# Patient Record
Sex: Male | Born: 1950 | Race: White | Hispanic: No | Marital: Married | Smoking: Former smoker
Health system: Southern US, Community
[De-identification: ages and names within clinical notes are randomized; demographics above are authoritative.]

## PROBLEM LIST (undated history)

## (undated) DIAGNOSIS — E119 Type 2 diabetes mellitus without complications: Secondary | ICD-10-CM

## (undated) DIAGNOSIS — I10 Essential (primary) hypertension: Secondary | ICD-10-CM

## (undated) HISTORY — PX: CATARACT EXTRACTION, BILATERAL: SHX1313

---

## 2006-07-05 ENCOUNTER — Ambulatory Visit: Payer: Self-pay | Admitting: Ophthalmology

## 2012-07-25 ENCOUNTER — Ambulatory Visit
Admission: RE | Admit: 2012-07-25 | Discharge: 2012-07-25 | Disposition: A | Discharge status: Home / Self Care | Payer: BC Managed Care – PPO | Source: Ambulatory Visit | Attending: Orthopedic Surgery | Admitting: Orthopedic Surgery

## 2012-07-25 ENCOUNTER — Other Ambulatory Visit: Payer: Self-pay | Admitting: Orthopedic Surgery

## 2012-07-25 DIAGNOSIS — J984 Other disorders of lung: Secondary | ICD-10-CM

## 2012-09-01 ENCOUNTER — Emergency Department (HOSPITAL_COMMUNITY): Payer: BC Managed Care – PPO

## 2012-09-01 ENCOUNTER — Emergency Department (HOSPITAL_COMMUNITY)
Admission: EM | Admit: 2012-09-01 | Discharge: 2012-09-01 | Disposition: A | Discharge status: Home / Self Care | Payer: BC Managed Care – PPO | Attending: Emergency Medicine | Admitting: Emergency Medicine

## 2012-09-01 ENCOUNTER — Encounter (HOSPITAL_COMMUNITY): Payer: Self-pay | Admitting: Emergency Medicine

## 2012-09-01 DIAGNOSIS — Z8639 Personal history of other endocrine, nutritional and metabolic disease: Secondary | ICD-10-CM | POA: Insufficient documentation

## 2012-09-01 DIAGNOSIS — I10 Essential (primary) hypertension: Secondary | ICD-10-CM | POA: Insufficient documentation

## 2012-09-01 DIAGNOSIS — R0602 Shortness of breath: Secondary | ICD-10-CM | POA: Insufficient documentation

## 2012-09-01 DIAGNOSIS — Z87891 Personal history of nicotine dependence: Secondary | ICD-10-CM | POA: Insufficient documentation

## 2012-09-01 DIAGNOSIS — R112 Nausea with vomiting, unspecified: Secondary | ICD-10-CM | POA: Insufficient documentation

## 2012-09-01 DIAGNOSIS — E119 Type 2 diabetes mellitus without complications: Secondary | ICD-10-CM | POA: Insufficient documentation

## 2012-09-01 DIAGNOSIS — R61 Generalized hyperhidrosis: Secondary | ICD-10-CM | POA: Insufficient documentation

## 2012-09-01 DIAGNOSIS — R000 Tachycardia, unspecified: Secondary | ICD-10-CM | POA: Insufficient documentation

## 2012-09-01 DIAGNOSIS — R42 Dizziness and giddiness: Secondary | ICD-10-CM | POA: Insufficient documentation

## 2012-09-01 DIAGNOSIS — Z862 Personal history of diseases of the blood and blood-forming organs and certain disorders involving the immune mechanism: Secondary | ICD-10-CM | POA: Insufficient documentation

## 2012-09-01 HISTORY — DX: Type 2 diabetes mellitus without complications: E11.9

## 2012-09-01 HISTORY — DX: Essential (primary) hypertension: I10

## 2012-09-01 LAB — CBC WITH DIFFERENTIAL/PLATELET
Basophils Absolute: 0 10*3/uL (ref 0.0–0.1)
Eosinophils Absolute: 0 10*3/uL (ref 0.0–0.7)
Eosinophils Relative: 0 % (ref 0–5)
HCT: 48.4 % (ref 39.0–52.0)
Lymphocytes Relative: 2 % — ABNORMAL LOW (ref 12–46)
Lymphs Abs: 0.3 10*3/uL — ABNORMAL LOW (ref 0.7–4.0)
MCH: 35.1 pg — ABNORMAL HIGH (ref 26.0–34.0)
MCV: 98.8 fL (ref 78.0–100.0)
Monocytes Absolute: 0.5 10*3/uL (ref 0.1–1.0)
Platelets: 143 10*3/uL — ABNORMAL LOW (ref 150–400)
RDW: 13.1 % (ref 11.5–15.5)
WBC: 10.7 10*3/uL — ABNORMAL HIGH (ref 4.0–10.5)

## 2012-09-01 LAB — POCT I-STAT, CHEM 8
BUN: 27 mg/dL — ABNORMAL HIGH (ref 6–23)
Calcium, Ion: 1.16 mmol/L (ref 1.13–1.30)
Chloride: 106 mEq/L (ref 96–112)
Glucose, Bld: 273 mg/dL — ABNORMAL HIGH (ref 70–99)
TCO2: 25 mmol/L (ref 0–100)

## 2012-09-01 MED ORDER — ONDANSETRON HCL 4 MG/2ML IJ SOLN
4.0000 mg | Freq: Once | INTRAMUSCULAR | Status: AC
  Administered 2012-09-01: 4 mg via INTRAVENOUS
  Filled 2012-09-01: qty 2

## 2012-09-01 MED ORDER — SODIUM CHLORIDE 0.9 % IV BOLUS (SEPSIS)
1000.0000 mL | Freq: Once | INTRAVENOUS | Status: AC
  Administered 2012-09-01: 1000 mL via INTRAVENOUS

## 2012-09-01 MED ORDER — IOHEXOL 350 MG/ML SOLN
100.0000 mL | Freq: Once | INTRAVENOUS | Status: AC | PRN
  Administered 2012-09-01: 100 mL via INTRAVENOUS

## 2012-09-01 MED ORDER — ASPIRIN 81 MG PO CHEW
324.0000 mg | CHEWABLE_TABLET | Freq: Once | ORAL | Status: AC
  Administered 2012-09-01: 324 mg via ORAL
  Filled 2012-09-01: qty 4

## 2012-09-01 NOTE — ED Provider Notes (Signed)
History     CSN: [id number]  Arrival date & time 09/01/12  1453   First MD Initiated Contact with Patient 09/01/12 1507      Chief Complaint  Patient presents with  . Palpitations  . Emesis    (Consider location/radiation/quality/duration/timing/severity/associated sxs/prior treatment) HPI Comments: Patient presents today with a chief complaint of SOB, palpitations, diaphoresis, nausea, and vomiting. He reports that he began having symptoms earlier this morning.  He has had two episodes of vomiting.  No diarrhea.  He denies abdominal pain.  He denies chest pain.  He reports that PMH includes HTN, Hyperlipidemia, and DM.  He quit smoking three years ago.  He smoked for 25-30 years.  He denies any prior cardiac history.  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  He denies any prolonged travel or surgeries in the past 4 weeks.  He denies prior history of DVT or PE.  He denies any lower extremity swelling, pain, or erythema.  No cough or hemoptysis.  No prior history of Cancer.  He reports that his PO intake has been less today due to nausea.  He had three cups of coffee this morning and vomited after the coffee.  He has not had anything to drink since then.  He is feeling slightly lightheaded.  He denies headache.  Denies weakness.  PCP is Roque Cash with [facility].    The history is provided by the patient.    Past Medical History  Diagnosis Date  . Hypertension     History reviewed. No pertinent past surgical history.  History reviewed. No pertinent family history.  History  Substance Use Topics  . Smoking status: Never Smoker   . Smokeless tobacco: Not on file  . Alcohol Use: Yes      Review of Systems  Constitutional: Positive for diaphoresis. Negative for fever and chills.  Respiratory: Positive for shortness of breath. Negative for  cough and wheezing.   Cardiovascular: Positive for palpitations. Negative for chest pain and leg swelling.  Gastrointestinal: Positive for nausea and vomiting. Negative for abdominal pain, diarrhea and constipation.  Neurological: Positive for dizziness and light-headedness. Negative for syncope, weakness and numbness.  Psychiatric/Behavioral: Negative for confusion.  All other systems reviewed and are negative.    Allergies  Review of patient's allergies indicates no known allergies.  Home Medications  No current outpatient prescriptions on file.  BP 166/109  Pulse 109  Temp(Src) 97 F (36.1 C) (Oral)  Resp 14  SpO2 100%  Physical Exam  Nursing note and vitals reviewed. Constitutional: He appears well-developed and well-nourished. No distress.  HENT:  Head: Normocephalic and atraumatic.  Mouth/Throat: Oropharynx is clear and moist.  Neck: Normal range of motion. Neck supple.  Cardiovascular: Regular rhythm, normal heart sounds and intact distal pulses.  Tachycardia present.   Pulmonary/Chest: Effort normal and breath sounds normal. No respiratory distress. He has no wheezes. He has no rales.  Abdominal: Soft. Bowel sounds are normal. He exhibits no distension and no mass. There is no tenderness. There is no rebound and no guarding.  Musculoskeletal: Normal range of motion. He exhibits no edema.       No LE edema or erythema  Neurological: He is alert.  Skin: Skin is warm and dry. He is not diaphoretic.  Psychiatric: He has a normal mood and affect.  ED Course  Procedures (including critical care time)   Labs Reviewed  CBC WITH DIFFERENTIAL  D-DIMER, QUANTITATIVE   Dg Chest 2 View  09/01/2012  *RADIOLOGY REPORT*  Clinical Data: Hypertension, palpitations, vomiting  CHEST - 2 VIEW  Comparison: CT chest of 07/25/2012  Findings: No active infiltrate or effusion is seen.  Mediastinal contours appear normal.  The heart is within upper limits of normal.  No acute skeletal  abnormality is seen.  IMPRESSION: No active lung disease.   Original Report Authenticated By: Ivar Drape, M.D.    Ct Angio Chest Pe W/cm &/or Wo Cm  09/01/2012  *RADIOLOGY REPORT*  Clinical Data: Tachycardia, shortness of breath.  CT ANGIOGRAPHY CHEST  Technique:  Multidetector CT imaging of the chest using the standard protocol during bolus administration of intravenous contrast. Multiplanar reconstructed images including MIPs were obtained and reviewed to evaluate the vascular anatomy.  Contrast: 15mL OMNIPAQUE IOHEXOL 350 MG/ML SOLN  Comparison: The thorax 07/25/2012  Findings: There are no filling defect within the pulmonary arteries to suggest acute pulmonary embolism.  No acute findings of the aorta or great vessels.  No pericardial fluid.  Esophagus is normal.  No mediastinal or hilar lymphadenopathy.  No axillary supraclavicular adenopathy.  Limited view upper abdomen is unremarkable.  No evidence pneumothorax or pulmonary infection.  Review of the bone windows demonstrate no aggressive osseous lesions.  Degenerative osteophytosis of the thoracic spine.   IMPRESSION: No evidence of pulmonary embolism.  Degenerative spurring of the spine.  No change from prior.   Original Report Authenticated By: Suzy Bouchard, M.D.      No diagnosis found.   Date: 09/01/2012  Rate: 133  Rhythm: sinus tachycardia  QRS Axis: normal  Intervals: normal  ST/T Wave abnormalities: nonspecific ST changes  Conduction Disutrbances:none  Narrative Interpretation:   Old EKG Reviewed: none available  6:30 PM Patient signed out to Dr. Vanita Panda.  Patient remains tachycardic and is currently getting his second bolus of fluid.    MDM  Patient presents today with a chief complaint of nausea, vomiting, shortness of breath, palpitations and diaphoresis.  Patient found to be tachycardic with HR of 133.  Patient given IVF and pulse improved to 110-115 bpm.  CTA negative for PE.  Initial troponin negative.  No ischemic  changes on EKG.  Negative CXR.  Patient signed out to Dr. Vanita Panda.          Sherlyn Lees Anderson, PA-C 09/02/12 [phone]

## 2012-09-01 NOTE — ED Notes (Signed)
Pt c/o SOB, palpitation, diaphoresis and nausea starting this am

## 2012-09-02 NOTE — ED Provider Notes (Signed)
  Medical screening examination/treatment/procedure(s) were performed by non-physician practitioner and as supervising physician I was immediately available for consultation/collaboration.  On my exam the patient was in no distress. On my initial exam, and on multiple subsequent exams the patient had essentially no complaints, though he persistently had tachycardia in spite of IV fluids.  Absent any ongoing complaints, though there were abnormal vital signs, I discussed this case with the patient's primary care team.  We decided the patient will be discharged, with next-day phone followup.  The patient was amenable to this plan, preferred discharge.  I saw the ECG (if appropriate), relevant labs and studies - I agree with the interpretation.    Carmin Muskrat, MD 09/02/12 2326

## 2013-02-18 DIAGNOSIS — K219 Gastro-esophageal reflux disease without esophagitis: Secondary | ICD-10-CM | POA: Insufficient documentation

## 2013-02-18 DIAGNOSIS — Z8673 Personal history of transient ischemic attack (TIA), and cerebral infarction without residual deficits: Secondary | ICD-10-CM | POA: Insufficient documentation

## 2014-02-26 ENCOUNTER — Other Ambulatory Visit (HOSPITAL_COMMUNITY): Payer: BC Managed Care – PPO

## 2014-03-13 ENCOUNTER — Ambulatory Visit (HOSPITAL_COMMUNITY)
Admission: RE | Admit: 2014-03-13 | Discharge: 2014-03-13 | Disposition: A | Discharge status: Home / Self Care | Payer: BC Managed Care – PPO | Source: Ambulatory Visit | Attending: Vascular Surgery | Admitting: Vascular Surgery

## 2014-03-13 ENCOUNTER — Other Ambulatory Visit (HOSPITAL_COMMUNITY): Payer: Self-pay | Admitting: Endocrinology

## 2014-03-13 DIAGNOSIS — I6529 Occlusion and stenosis of unspecified carotid artery: Secondary | ICD-10-CM | POA: Diagnosis present

## 2015-10-28 DIAGNOSIS — E784 Other hyperlipidemia: Secondary | ICD-10-CM | POA: Diagnosis not present

## 2015-10-28 DIAGNOSIS — R740 Nonspecific elevation of levels of transaminase and lactic acid dehydrogenase [LDH]: Secondary | ICD-10-CM | POA: Diagnosis not present

## 2015-10-28 DIAGNOSIS — I10 Essential (primary) hypertension: Secondary | ICD-10-CM | POA: Diagnosis not present

## 2015-10-28 DIAGNOSIS — E119 Type 2 diabetes mellitus without complications: Secondary | ICD-10-CM | POA: Diagnosis not present

## 2015-10-28 DIAGNOSIS — I6529 Occlusion and stenosis of unspecified carotid artery: Secondary | ICD-10-CM | POA: Diagnosis not present

## 2015-10-28 DIAGNOSIS — Z6828 Body mass index (BMI) 28.0-28.9, adult: Secondary | ICD-10-CM | POA: Diagnosis not present

## 2015-10-28 DIAGNOSIS — R61 Generalized hyperhidrosis: Secondary | ICD-10-CM | POA: Diagnosis not present

## 2015-10-28 DIAGNOSIS — Z1389 Encounter for screening for other disorder: Secondary | ICD-10-CM | POA: Diagnosis not present

## 2015-10-28 DIAGNOSIS — R269 Unspecified abnormalities of gait and mobility: Secondary | ICD-10-CM | POA: Diagnosis not present

## 2016-01-21 ENCOUNTER — Other Ambulatory Visit (HOSPITAL_COMMUNITY): Payer: Self-pay | Admitting: Endocrinology

## 2016-01-21 ENCOUNTER — Ambulatory Visit (HOSPITAL_COMMUNITY)
Admission: RE | Admit: 2016-01-21 | Discharge: 2016-01-21 | Disposition: A | Discharge status: Home / Self Care | Payer: Medicare Other | Source: Ambulatory Visit | Attending: Vascular Surgery | Admitting: Vascular Surgery

## 2016-01-21 DIAGNOSIS — I6522 Occlusion and stenosis of left carotid artery: Secondary | ICD-10-CM | POA: Insufficient documentation

## 2016-01-21 DIAGNOSIS — I10 Essential (primary) hypertension: Secondary | ICD-10-CM | POA: Insufficient documentation

## 2016-01-21 DIAGNOSIS — E119 Type 2 diabetes mellitus without complications: Secondary | ICD-10-CM | POA: Diagnosis not present

## 2016-01-21 DIAGNOSIS — I6529 Occlusion and stenosis of unspecified carotid artery: Secondary | ICD-10-CM

## 2016-01-21 LAB — VAS US CAROTID
LCCAPDIAS: 12 cm/s
LEFT ECA DIAS: -21 cm/s
Left CCA dist dias: 14 cm/s
Left CCA dist sys: 49 cm/s
Left CCA prox sys: 73 cm/s
RCCAPDIAS: 8 cm/s
RIGHT CCA MID DIAS: 10 cm/s
RIGHT ECA DIAS: -12 cm/s
Right CCA prox sys: 66 cm/s
Right cca dist sys: -68 cm/s

## 2016-03-02 DIAGNOSIS — R2689 Other abnormalities of gait and mobility: Secondary | ICD-10-CM | POA: Diagnosis not present

## 2016-03-02 DIAGNOSIS — E119 Type 2 diabetes mellitus without complications: Secondary | ICD-10-CM | POA: Diagnosis not present

## 2016-03-02 DIAGNOSIS — Z6827 Body mass index (BMI) 27.0-27.9, adult: Secondary | ICD-10-CM | POA: Diagnosis not present

## 2016-03-02 DIAGNOSIS — E784 Other hyperlipidemia: Secondary | ICD-10-CM | POA: Diagnosis not present

## 2016-03-02 DIAGNOSIS — I6529 Occlusion and stenosis of unspecified carotid artery: Secondary | ICD-10-CM | POA: Diagnosis not present

## 2016-03-02 DIAGNOSIS — I10 Essential (primary) hypertension: Secondary | ICD-10-CM | POA: Diagnosis not present

## 2016-06-01 DIAGNOSIS — E119 Type 2 diabetes mellitus without complications: Secondary | ICD-10-CM | POA: Diagnosis not present

## 2016-06-29 DIAGNOSIS — I6529 Occlusion and stenosis of unspecified carotid artery: Secondary | ICD-10-CM | POA: Diagnosis not present

## 2016-06-29 DIAGNOSIS — R2689 Other abnormalities of gait and mobility: Secondary | ICD-10-CM | POA: Diagnosis not present

## 2016-06-29 DIAGNOSIS — E784 Other hyperlipidemia: Secondary | ICD-10-CM | POA: Diagnosis not present

## 2016-06-29 DIAGNOSIS — R740 Nonspecific elevation of levels of transaminase and lactic acid dehydrogenase [LDH]: Secondary | ICD-10-CM | POA: Diagnosis not present

## 2016-06-29 DIAGNOSIS — Z6828 Body mass index (BMI) 28.0-28.9, adult: Secondary | ICD-10-CM | POA: Diagnosis not present

## 2016-06-29 DIAGNOSIS — I10 Essential (primary) hypertension: Secondary | ICD-10-CM | POA: Diagnosis not present

## 2016-06-29 DIAGNOSIS — E119 Type 2 diabetes mellitus without complications: Secondary | ICD-10-CM | POA: Diagnosis not present

## 2016-06-29 DIAGNOSIS — M25562 Pain in left knee: Secondary | ICD-10-CM | POA: Diagnosis not present

## 2016-06-29 DIAGNOSIS — M25561 Pain in right knee: Secondary | ICD-10-CM | POA: Diagnosis not present

## 2016-09-09 ENCOUNTER — Encounter (HOSPITAL_COMMUNITY): Payer: Self-pay

## 2016-09-09 ENCOUNTER — Emergency Department (HOSPITAL_COMMUNITY)
Admission: EM | Admit: 2016-09-09 | Discharge: 2016-09-09 | Disposition: A | Discharge status: Home / Self Care | Payer: Medicare Other | Attending: Emergency Medicine | Admitting: Emergency Medicine

## 2016-09-09 DIAGNOSIS — I10 Essential (primary) hypertension: Secondary | ICD-10-CM | POA: Insufficient documentation

## 2016-09-09 DIAGNOSIS — H6591 Unspecified nonsuppurative otitis media, right ear: Secondary | ICD-10-CM

## 2016-09-09 DIAGNOSIS — H73891 Other specified disorders of tympanic membrane, right ear: Secondary | ICD-10-CM | POA: Diagnosis not present

## 2016-09-09 DIAGNOSIS — Z7984 Long term (current) use of oral hypoglycemic drugs: Secondary | ICD-10-CM | POA: Insufficient documentation

## 2016-09-09 DIAGNOSIS — E119 Type 2 diabetes mellitus without complications: Secondary | ICD-10-CM | POA: Diagnosis not present

## 2016-09-09 DIAGNOSIS — H6121 Impacted cerumen, right ear: Secondary | ICD-10-CM | POA: Diagnosis not present

## 2016-09-09 DIAGNOSIS — Z7982 Long term (current) use of aspirin: Secondary | ICD-10-CM | POA: Diagnosis not present

## 2016-09-09 DIAGNOSIS — H9191 Unspecified hearing loss, right ear: Secondary | ICD-10-CM | POA: Diagnosis present

## 2016-09-09 MED ORDER — OXYMETAZOLINE HCL 0.05 % NA SOLN
1.0000 | Freq: Two times a day (BID) | NASAL | 0 refills | Status: DC
Start: 2016-09-09 — End: 2019-07-02

## 2016-09-09 NOTE — Discharge Instructions (Signed)
I recommend using the decongestant nasal spray as prescribed for the next 2-3 days to help with the fluid behind your right eardrum.  If your symptoms have not improved over the next 2-3 days I recommend being seen by your primary care provider or returning to the emergency department for reevaluation. Please return to the Emergency Department if symptoms worsen or new onset of fever, swelling or redness around your ear, ear drainage, loss of hearing, dizziness.

## 2016-09-09 NOTE — ED Provider Notes (Signed)
[facility] DEPT Provider Note   CSN: [id number] Arrival date & time: 09/09/16  0840     History   Chief Complaint Chief Complaint  Patient presents with  . ear stopped up    HPI Austin Goodwin is a 66 y.o. male.  HPI   Patient is a 66 year old male with history of hypertension and diabetes who presents to the ED with complaint of difficulty hearing from his right ear, onset this morning. Patient reports when he woke up today he felt like his right ear was "stopped up". He reports having similar symptoms in the past, last time occurring a year ago when he had a cerumen impaction. Patient endorses using Q-tips at home. Denies fever, facial/ear swelling or redness, ear drainage, nasal congestion, rhinorrhea, sore throat. Denies taking any medications at home for symptoms.  Past Medical History:  Diagnosis Date  . Diabetes mellitus without complication ([place])   . Hypertension     There are no active problems to display for this patient.   History reviewed. No pertinent surgical history.     Home Medications    Prior to Admission medications   Medication Sig Start Date End Date Taking? Authorizing Provider  aspirin 81 MG chewable tablet Chew 81 mg by mouth at bedtime.    Historical Provider, MD  glipiZIDE (GLUCOTROL) 10 MG tablet Take 10 mg by mouth 2 (two) times daily before a meal.    Historical Provider, MD  lisinopril (PRINIVIL,ZESTRIL) 10 MG tablet Take 10 mg by mouth 3 (three) times daily.    Historical Provider, MD  metFORMIN (GLUCOPHAGE) 500 MG tablet Take 500 mg by mouth 2 (two) times daily with a meal.    Historical Provider, MD  oxymetazoline (AFRIN NASAL SPRAY) 0.05 % nasal spray Place 1 spray into both nostrils 2 (two) times daily. Spray once into each nostril twice daily for up to the next 3 days. Do not use for more than 3 days to prevent rebound rhinorrhea. 09/09/16   Nona Dell, PA-C  rosuvastatin (CRESTOR) 10 MG tablet Take 10 mg by mouth  once a week. Wednesday    Historical Provider, MD    Family History No family history on file.  Social History Social History  Substance Use Topics  . Smoking status: Never Smoker  . Smokeless tobacco: Not on file  . Alcohol use Yes     Allergies   Patient has no known allergies.   Review of Systems Review of Systems  Constitutional: Negative for fever.  HENT: Positive for ear pain. Negative for congestion, ear discharge, facial swelling, sinus pain and sore throat.   Neurological: Negative for headaches.     Physical Exam Updated Vital Signs BP 178/93 (BP Location: Left Arm)   Pulse 80   Temp 98.3 F (36.8 C) (Oral)   Resp 18   SpO2 98%   Physical Exam  Constitutional: He is oriented to person, place, and time. He appears well-developed and well-nourished.  HENT:  Head: Normocephalic and atraumatic.  Right Ear: Tympanic membrane, external ear and ear canal normal. No drainage, swelling or tenderness. No mastoid tenderness. Tympanic membrane is not erythematous. No middle ear effusion. Decreased hearing (mild) is noted.  Left Ear: Hearing, tympanic membrane, external ear and ear canal normal. No drainage, swelling or tenderness. No mastoid tenderness.  No middle ear effusion. No decreased hearing is noted.  Mouth/Throat: Uvula is midline, oropharynx is clear and moist and mucous membranes are normal. No oropharyngeal exudate, posterior oropharyngeal edema, posterior  oropharyngeal erythema or tonsillar abscesses. No tonsillar exudate.  Cerumen impaction present to right ear canal, only able to visualize half of right TM.  Eyes: Conjunctivae and EOM are normal. Pupils are equal, round, and reactive to light. Right eye exhibits no discharge. Left eye exhibits no discharge. No scleral icterus.  Neck: Normal range of motion. Neck supple.  Cardiovascular: Normal rate, regular rhythm, normal heart sounds and intact distal pulses.   Pulmonary/Chest: Effort normal and breath  sounds normal. No respiratory distress. He has no wheezes. He has no rales. He exhibits no tenderness.  Abdominal: He exhibits no distension.  Lymphadenopathy:    He has no cervical adenopathy.  Neurological: He is alert and oriented to person, place, and time.  Nursing note and vitals reviewed.    ED Treatments / Results  Labs (all labs ordered are listed, but only abnormal results are displayed) Labs Reviewed - No data to display  EKG  EKG Interpretation None       Radiology No results found.  Procedures .Ear Cerumen Removal Date/Time: 09/09/2016 10:15 AM Performed by: Nona Dell Authorized by: Nona Dell   Consent:    Consent obtained:  Verbal   Consent given by:  Patient Procedure details:    Location:  R ear   Procedure type: irrigation   Post-procedure details:    Inspection:  TM intact (Right mid ear effusion present)   Post-procedure hearing quality: Mildy decreased.   Patient tolerance of procedure:  Tolerated well, no immediate complications   (including critical care time)  Medications Ordered in ED Medications - No data to display   Initial Impression / Assessment and Plan / ED Course  I have reviewed the triage vital signs and the nursing notes.  Pertinent labs & imaging results that were available during my care of the patient were reviewed by me and considered in my medical decision making (see chart for details).     Patient presents with muffled hearing to the right ear that started this morning. Reports similar symptoms in the past with cerumen impaction. Denies fever, ear drainage or loss of hearing. VSS. Exam revealed partial right cerumen impaction, only able to visualize half of right TM which appears intact. Hearing grossly intact. Remaining exam unremarkable. Right ear irrigated by nurse. On reevaluation cerumen impaction removed with irrigation and ear curet. TM intact with middle ear effusion present. Suspect  patient's symptoms are likely due to middle ear effusion. Plan to discharge patient home with decongestion and PCP follow-up. Discussed return precautions.  Final Clinical Impressions(s) / ED Diagnoses   Final diagnoses:  Fluid level behind tympanic membrane of right ear  Impacted cerumen of right ear    New Prescriptions New Prescriptions   OXYMETAZOLINE (AFRIN NASAL SPRAY) 0.05 % NASAL SPRAY    Place 1 spray into both nostrils 2 (two) times daily. Spray once into each nostril twice daily for up to the next 3 days. Do not use for more than 3 days to prevent rebound rhinorrhea.     Austin Goodwin [place], [place] 09/09/16 [place], MD 09/09/16 [phone]

## 2016-09-09 NOTE — ED Triage Notes (Signed)
Patient complains of right ear stopped up for a few days, no pain, no cold symptoms.

## 2016-09-09 NOTE — ED Notes (Signed)
Irrigated right ear with warm water and peroixide.

## 2016-09-09 NOTE — ED Notes (Signed)
C/o difficulty hearing out of right ear, states he has had problems with wax build up before.

## 2016-09-13 DIAGNOSIS — H6691 Otitis media, unspecified, right ear: Secondary | ICD-10-CM | POA: Diagnosis not present

## 2016-09-13 DIAGNOSIS — H6981 Other specified disorders of Eustachian tube, right ear: Secondary | ICD-10-CM | POA: Diagnosis not present

## 2016-09-13 DIAGNOSIS — H60501 Unspecified acute noninfective otitis externa, right ear: Secondary | ICD-10-CM | POA: Diagnosis not present

## 2016-09-13 DIAGNOSIS — H9191 Unspecified hearing loss, right ear: Secondary | ICD-10-CM | POA: Diagnosis not present

## 2016-09-21 DIAGNOSIS — H6981 Other specified disorders of Eustachian tube, right ear: Secondary | ICD-10-CM | POA: Diagnosis not present

## 2016-09-21 DIAGNOSIS — H9191 Unspecified hearing loss, right ear: Secondary | ICD-10-CM | POA: Diagnosis not present

## 2016-09-21 DIAGNOSIS — H60501 Unspecified acute noninfective otitis externa, right ear: Secondary | ICD-10-CM | POA: Diagnosis not present

## 2016-10-04 DIAGNOSIS — H903 Sensorineural hearing loss, bilateral: Secondary | ICD-10-CM | POA: Diagnosis not present

## 2016-10-04 DIAGNOSIS — E119 Type 2 diabetes mellitus without complications: Secondary | ICD-10-CM | POA: Insufficient documentation

## 2016-10-04 DIAGNOSIS — H9121 Sudden idiopathic hearing loss, right ear: Secondary | ICD-10-CM | POA: Diagnosis not present

## 2016-10-18 DIAGNOSIS — H90A22 Sensorineural hearing loss, unilateral, left ear, with restricted hearing on the contralateral side: Secondary | ICD-10-CM | POA: Diagnosis not present

## 2016-10-18 DIAGNOSIS — H9121 Sudden idiopathic hearing loss, right ear: Secondary | ICD-10-CM | POA: Diagnosis not present

## 2016-11-04 DIAGNOSIS — H903 Sensorineural hearing loss, bilateral: Secondary | ICD-10-CM | POA: Diagnosis not present

## 2016-11-04 DIAGNOSIS — H9121 Sudden idiopathic hearing loss, right ear: Secondary | ICD-10-CM | POA: Diagnosis not present

## 2016-11-17 DIAGNOSIS — I6529 Occlusion and stenosis of unspecified carotid artery: Secondary | ICD-10-CM | POA: Diagnosis not present

## 2016-11-17 DIAGNOSIS — E1159 Type 2 diabetes mellitus with other circulatory complications: Secondary | ICD-10-CM | POA: Diagnosis not present

## 2016-11-17 DIAGNOSIS — I10 Essential (primary) hypertension: Secondary | ICD-10-CM | POA: Diagnosis not present

## 2016-11-17 DIAGNOSIS — E784 Other hyperlipidemia: Secondary | ICD-10-CM | POA: Diagnosis not present

## 2016-11-17 DIAGNOSIS — E291 Testicular hypofunction: Secondary | ICD-10-CM | POA: Diagnosis not present

## 2016-11-18 DIAGNOSIS — H9121 Sudden idiopathic hearing loss, right ear: Secondary | ICD-10-CM | POA: Diagnosis not present

## 2016-11-18 DIAGNOSIS — H918X3 Other specified hearing loss, bilateral: Secondary | ICD-10-CM | POA: Diagnosis not present

## 2017-02-17 DIAGNOSIS — E784 Other hyperlipidemia: Secondary | ICD-10-CM | POA: Diagnosis not present

## 2017-02-17 DIAGNOSIS — E1151 Type 2 diabetes mellitus with diabetic peripheral angiopathy without gangrene: Secondary | ICD-10-CM | POA: Diagnosis not present

## 2017-02-17 DIAGNOSIS — H9120 Sudden idiopathic hearing loss, unspecified ear: Secondary | ICD-10-CM | POA: Diagnosis not present

## 2017-02-17 DIAGNOSIS — I6529 Occlusion and stenosis of unspecified carotid artery: Secondary | ICD-10-CM | POA: Diagnosis not present

## 2017-04-26 DIAGNOSIS — H43811 Vitreous degeneration, right eye: Secondary | ICD-10-CM | POA: Diagnosis not present

## 2017-06-01 DIAGNOSIS — M1611 Unilateral primary osteoarthritis, right hip: Secondary | ICD-10-CM | POA: Diagnosis not present

## 2017-06-01 DIAGNOSIS — Z6829 Body mass index (BMI) 29.0-29.9, adult: Secondary | ICD-10-CM | POA: Diagnosis not present

## 2017-06-01 DIAGNOSIS — M545 Low back pain: Secondary | ICD-10-CM | POA: Diagnosis not present

## 2017-06-01 DIAGNOSIS — M25551 Pain in right hip: Secondary | ICD-10-CM | POA: Diagnosis not present

## 2017-06-03 DIAGNOSIS — M5416 Radiculopathy, lumbar region: Secondary | ICD-10-CM | POA: Diagnosis not present

## 2017-06-03 DIAGNOSIS — E7849 Other hyperlipidemia: Secondary | ICD-10-CM | POA: Diagnosis not present

## 2017-06-03 DIAGNOSIS — E1151 Type 2 diabetes mellitus with diabetic peripheral angiopathy without gangrene: Secondary | ICD-10-CM | POA: Diagnosis not present

## 2017-06-03 DIAGNOSIS — I10 Essential (primary) hypertension: Secondary | ICD-10-CM | POA: Diagnosis not present

## 2017-06-07 DIAGNOSIS — M5116 Intervertebral disc disorders with radiculopathy, lumbar region: Secondary | ICD-10-CM | POA: Diagnosis not present

## 2017-06-07 DIAGNOSIS — M4316 Spondylolisthesis, lumbar region: Secondary | ICD-10-CM | POA: Diagnosis not present

## 2017-06-07 DIAGNOSIS — M4726 Other spondylosis with radiculopathy, lumbar region: Secondary | ICD-10-CM | POA: Diagnosis not present

## 2017-06-07 DIAGNOSIS — M5117 Intervertebral disc disorders with radiculopathy, lumbosacral region: Secondary | ICD-10-CM | POA: Diagnosis not present

## 2017-06-09 DIAGNOSIS — M4316 Spondylolisthesis, lumbar region: Secondary | ICD-10-CM | POA: Diagnosis not present

## 2017-06-09 DIAGNOSIS — M5126 Other intervertebral disc displacement, lumbar region: Secondary | ICD-10-CM | POA: Diagnosis not present

## 2017-06-20 ENCOUNTER — Other Ambulatory Visit: Payer: Self-pay | Admitting: Neurosurgery

## 2017-06-20 DIAGNOSIS — M4316 Spondylolisthesis, lumbar region: Secondary | ICD-10-CM

## 2017-06-20 DIAGNOSIS — I10 Essential (primary) hypertension: Secondary | ICD-10-CM | POA: Diagnosis not present

## 2017-06-20 DIAGNOSIS — M5416 Radiculopathy, lumbar region: Secondary | ICD-10-CM | POA: Diagnosis not present

## 2017-06-20 DIAGNOSIS — E1151 Type 2 diabetes mellitus with diabetic peripheral angiopathy without gangrene: Secondary | ICD-10-CM | POA: Diagnosis not present

## 2017-06-20 DIAGNOSIS — E7849 Other hyperlipidemia: Secondary | ICD-10-CM | POA: Diagnosis not present

## 2017-06-21 ENCOUNTER — Ambulatory Visit
Admission: RE | Admit: 2017-06-21 | Discharge: 2017-06-21 | Disposition: A | Discharge status: Home / Self Care | Payer: Medicare Other | Source: Ambulatory Visit | Attending: Neurosurgery | Admitting: Neurosurgery

## 2017-06-21 DIAGNOSIS — M48061 Spinal stenosis, lumbar region without neurogenic claudication: Secondary | ICD-10-CM | POA: Diagnosis not present

## 2017-06-21 DIAGNOSIS — M4316 Spondylolisthesis, lumbar region: Secondary | ICD-10-CM

## 2017-06-28 DIAGNOSIS — M5126 Other intervertebral disc displacement, lumbar region: Secondary | ICD-10-CM | POA: Diagnosis not present

## 2017-07-20 DIAGNOSIS — M26602 Left temporomandibular joint disorder, unspecified: Secondary | ICD-10-CM | POA: Diagnosis not present

## 2017-07-20 DIAGNOSIS — R6884 Jaw pain: Secondary | ICD-10-CM | POA: Diagnosis not present

## 2017-09-15 DIAGNOSIS — M4316 Spondylolisthesis, lumbar region: Secondary | ICD-10-CM | POA: Diagnosis not present

## 2017-09-27 DIAGNOSIS — H43821 Vitreomacular adhesion, right eye: Secondary | ICD-10-CM | POA: Diagnosis not present

## 2017-09-27 DIAGNOSIS — E113293 Type 2 diabetes mellitus with mild nonproliferative diabetic retinopathy without macular edema, bilateral: Secondary | ICD-10-CM | POA: Diagnosis not present

## 2017-09-27 DIAGNOSIS — H43813 Vitreous degeneration, bilateral: Secondary | ICD-10-CM | POA: Diagnosis not present

## 2017-10-19 DIAGNOSIS — E291 Testicular hypofunction: Secondary | ICD-10-CM | POA: Diagnosis not present

## 2017-10-19 DIAGNOSIS — E1151 Type 2 diabetes mellitus with diabetic peripheral angiopathy without gangrene: Secondary | ICD-10-CM | POA: Diagnosis not present

## 2017-10-19 DIAGNOSIS — I10 Essential (primary) hypertension: Secondary | ICD-10-CM | POA: Diagnosis not present

## 2017-10-19 DIAGNOSIS — E7849 Other hyperlipidemia: Secondary | ICD-10-CM | POA: Diagnosis not present

## 2017-10-19 DIAGNOSIS — M5416 Radiculopathy, lumbar region: Secondary | ICD-10-CM | POA: Diagnosis not present

## 2017-10-24 ENCOUNTER — Other Ambulatory Visit: Payer: Self-pay | Admitting: Endocrinology

## 2017-10-24 DIAGNOSIS — I6529 Occlusion and stenosis of unspecified carotid artery: Secondary | ICD-10-CM

## 2017-11-30 ENCOUNTER — Other Ambulatory Visit: Payer: Medicare Other

## 2017-11-30 DIAGNOSIS — M1711 Unilateral primary osteoarthritis, right knee: Secondary | ICD-10-CM | POA: Diagnosis not present

## 2017-11-30 DIAGNOSIS — M25562 Pain in left knee: Secondary | ICD-10-CM | POA: Diagnosis not present

## 2017-11-30 DIAGNOSIS — M1712 Unilateral primary osteoarthritis, left knee: Secondary | ICD-10-CM | POA: Diagnosis not present

## 2017-11-30 DIAGNOSIS — M25561 Pain in right knee: Secondary | ICD-10-CM | POA: Diagnosis not present

## 2018-01-16 ENCOUNTER — Ambulatory Visit
Admission: RE | Admit: 2018-01-16 | Discharge: 2018-01-16 | Disposition: A | Discharge status: Home / Self Care | Payer: Medicare Other | Source: Ambulatory Visit | Attending: Endocrinology | Admitting: Endocrinology

## 2018-01-16 DIAGNOSIS — I6523 Occlusion and stenosis of bilateral carotid arteries: Secondary | ICD-10-CM | POA: Diagnosis not present

## 2018-01-16 DIAGNOSIS — I6529 Occlusion and stenosis of unspecified carotid artery: Secondary | ICD-10-CM

## 2018-02-01 DIAGNOSIS — M1711 Unilateral primary osteoarthritis, right knee: Secondary | ICD-10-CM | POA: Diagnosis not present

## 2018-02-01 DIAGNOSIS — M1712 Unilateral primary osteoarthritis, left knee: Secondary | ICD-10-CM | POA: Diagnosis not present

## 2018-02-01 DIAGNOSIS — M25561 Pain in right knee: Secondary | ICD-10-CM | POA: Diagnosis not present

## 2018-02-01 DIAGNOSIS — M25562 Pain in left knee: Secondary | ICD-10-CM | POA: Diagnosis not present

## 2018-04-12 DIAGNOSIS — E7849 Other hyperlipidemia: Secondary | ICD-10-CM | POA: Diagnosis not present

## 2018-04-12 DIAGNOSIS — I6529 Occlusion and stenosis of unspecified carotid artery: Secondary | ICD-10-CM | POA: Diagnosis not present

## 2018-04-12 DIAGNOSIS — E291 Testicular hypofunction: Secondary | ICD-10-CM | POA: Diagnosis not present

## 2018-04-12 DIAGNOSIS — E1151 Type 2 diabetes mellitus with diabetic peripheral angiopathy without gangrene: Secondary | ICD-10-CM | POA: Diagnosis not present

## 2018-04-25 DIAGNOSIS — M4316 Spondylolisthesis, lumbar region: Secondary | ICD-10-CM | POA: Diagnosis not present

## 2018-05-07 DIAGNOSIS — E785 Hyperlipidemia, unspecified: Secondary | ICD-10-CM | POA: Diagnosis not present

## 2018-05-07 DIAGNOSIS — Z6828 Body mass index (BMI) 28.0-28.9, adult: Secondary | ICD-10-CM | POA: Diagnosis not present

## 2018-05-07 DIAGNOSIS — S43121A Dislocation of right acromioclavicular joint, 100%-200% displacement, initial encounter: Secondary | ICD-10-CM | POA: Diagnosis not present

## 2018-05-07 DIAGNOSIS — M19012 Primary osteoarthritis, left shoulder: Secondary | ICD-10-CM | POA: Diagnosis not present

## 2018-05-07 DIAGNOSIS — S3993XA Unspecified injury of pelvis, initial encounter: Secondary | ICD-10-CM | POA: Diagnosis not present

## 2018-05-07 DIAGNOSIS — Z87891 Personal history of nicotine dependence: Secondary | ICD-10-CM | POA: Diagnosis not present

## 2018-05-07 DIAGNOSIS — S0101XA Laceration without foreign body of scalp, initial encounter: Secondary | ICD-10-CM | POA: Diagnosis not present

## 2018-05-07 DIAGNOSIS — M19011 Primary osteoarthritis, right shoulder: Secondary | ICD-10-CM | POA: Diagnosis not present

## 2018-05-07 DIAGNOSIS — I10 Essential (primary) hypertension: Secondary | ICD-10-CM | POA: Diagnosis not present

## 2018-05-07 DIAGNOSIS — S43101A Unspecified dislocation of right acromioclavicular joint, initial encounter: Secondary | ICD-10-CM | POA: Diagnosis not present

## 2018-05-07 DIAGNOSIS — Z79899 Other long term (current) drug therapy: Secondary | ICD-10-CM | POA: Diagnosis not present

## 2018-05-07 DIAGNOSIS — E119 Type 2 diabetes mellitus without complications: Secondary | ICD-10-CM | POA: Diagnosis not present

## 2018-05-07 DIAGNOSIS — M25511 Pain in right shoulder: Secondary | ICD-10-CM | POA: Diagnosis not present

## 2018-05-08 DIAGNOSIS — M1711 Unilateral primary osteoarthritis, right knee: Secondary | ICD-10-CM | POA: Diagnosis not present

## 2018-05-08 DIAGNOSIS — M25562 Pain in left knee: Secondary | ICD-10-CM | POA: Diagnosis not present

## 2018-05-08 DIAGNOSIS — M25561 Pain in right knee: Secondary | ICD-10-CM | POA: Diagnosis not present

## 2018-05-08 DIAGNOSIS — S43101A Unspecified dislocation of right acromioclavicular joint, initial encounter: Secondary | ICD-10-CM | POA: Diagnosis not present

## 2018-06-12 ENCOUNTER — Ambulatory Visit (HOSPITAL_COMMUNITY)
Admission: RE | Admit: 2018-06-12 | Discharge: 2018-06-12 | Disposition: A | Discharge status: Home / Self Care | Payer: Medicare Other | Source: Ambulatory Visit | Attending: Family | Admitting: Family

## 2018-06-12 ENCOUNTER — Other Ambulatory Visit (HOSPITAL_COMMUNITY): Payer: Self-pay | Admitting: Endocrinology

## 2018-06-12 DIAGNOSIS — R600 Localized edema: Secondary | ICD-10-CM | POA: Insufficient documentation

## 2018-06-12 DIAGNOSIS — E7849 Other hyperlipidemia: Secondary | ICD-10-CM | POA: Diagnosis not present

## 2018-06-12 DIAGNOSIS — I8310 Varicose veins of unspecified lower extremity with inflammation: Secondary | ICD-10-CM | POA: Diagnosis not present

## 2018-06-12 DIAGNOSIS — M79604 Pain in right leg: Secondary | ICD-10-CM | POA: Diagnosis not present

## 2018-08-09 DIAGNOSIS — M25562 Pain in left knee: Secondary | ICD-10-CM | POA: Diagnosis not present

## 2018-08-09 DIAGNOSIS — M1711 Unilateral primary osteoarthritis, right knee: Secondary | ICD-10-CM | POA: Diagnosis not present

## 2018-08-09 DIAGNOSIS — M1712 Unilateral primary osteoarthritis, left knee: Secondary | ICD-10-CM | POA: Diagnosis not present

## 2018-08-09 DIAGNOSIS — M25561 Pain in right knee: Secondary | ICD-10-CM | POA: Diagnosis not present

## 2018-08-15 DIAGNOSIS — Z0120 Encounter for dental examination and cleaning without abnormal findings: Secondary | ICD-10-CM | POA: Diagnosis not present

## 2018-09-12 DIAGNOSIS — I10 Essential (primary) hypertension: Secondary | ICD-10-CM | POA: Diagnosis not present

## 2018-09-12 DIAGNOSIS — E7849 Other hyperlipidemia: Secondary | ICD-10-CM | POA: Diagnosis not present

## 2018-09-12 DIAGNOSIS — R82998 Other abnormal findings in urine: Secondary | ICD-10-CM | POA: Diagnosis not present

## 2018-09-12 DIAGNOSIS — Z125 Encounter for screening for malignant neoplasm of prostate: Secondary | ICD-10-CM | POA: Diagnosis not present

## 2018-09-12 DIAGNOSIS — E1151 Type 2 diabetes mellitus with diabetic peripheral angiopathy without gangrene: Secondary | ICD-10-CM | POA: Diagnosis not present

## 2018-09-19 DIAGNOSIS — I10 Essential (primary) hypertension: Secondary | ICD-10-CM | POA: Diagnosis not present

## 2018-09-19 DIAGNOSIS — Z0000 Encounter for general adult medical examination without abnormal findings: Secondary | ICD-10-CM | POA: Diagnosis not present

## 2018-09-19 DIAGNOSIS — E7849 Other hyperlipidemia: Secondary | ICD-10-CM | POA: Diagnosis not present

## 2018-09-19 DIAGNOSIS — E1151 Type 2 diabetes mellitus with diabetic peripheral angiopathy without gangrene: Secondary | ICD-10-CM | POA: Diagnosis not present

## 2018-10-04 DIAGNOSIS — H43821 Vitreomacular adhesion, right eye: Secondary | ICD-10-CM | POA: Diagnosis not present

## 2018-10-04 DIAGNOSIS — H43813 Vitreous degeneration, bilateral: Secondary | ICD-10-CM | POA: Diagnosis not present

## 2018-10-04 DIAGNOSIS — E113293 Type 2 diabetes mellitus with mild nonproliferative diabetic retinopathy without macular edema, bilateral: Secondary | ICD-10-CM | POA: Diagnosis not present

## 2018-11-15 DIAGNOSIS — M170 Bilateral primary osteoarthritis of knee: Secondary | ICD-10-CM | POA: Diagnosis not present

## 2018-11-22 DIAGNOSIS — M1712 Unilateral primary osteoarthritis, left knee: Secondary | ICD-10-CM | POA: Diagnosis not present

## 2018-11-22 DIAGNOSIS — M170 Bilateral primary osteoarthritis of knee: Secondary | ICD-10-CM | POA: Diagnosis not present

## 2018-11-22 DIAGNOSIS — M1711 Unilateral primary osteoarthritis, right knee: Secondary | ICD-10-CM | POA: Diagnosis not present

## 2018-11-22 DIAGNOSIS — M25562 Pain in left knee: Secondary | ICD-10-CM | POA: Diagnosis not present

## 2018-11-29 DIAGNOSIS — M170 Bilateral primary osteoarthritis of knee: Secondary | ICD-10-CM | POA: Diagnosis not present

## 2018-11-29 DIAGNOSIS — M1711 Unilateral primary osteoarthritis, right knee: Secondary | ICD-10-CM | POA: Diagnosis not present

## 2018-11-29 DIAGNOSIS — M1712 Unilateral primary osteoarthritis, left knee: Secondary | ICD-10-CM | POA: Diagnosis not present

## 2018-11-29 DIAGNOSIS — M25561 Pain in right knee: Secondary | ICD-10-CM | POA: Diagnosis not present

## 2018-12-15 DIAGNOSIS — M545 Low back pain: Secondary | ICD-10-CM | POA: Diagnosis not present

## 2018-12-15 DIAGNOSIS — I10 Essential (primary) hypertension: Secondary | ICD-10-CM | POA: Diagnosis not present

## 2019-01-10 DIAGNOSIS — M25561 Pain in right knee: Secondary | ICD-10-CM | POA: Diagnosis not present

## 2019-01-10 DIAGNOSIS — I6529 Occlusion and stenosis of unspecified carotid artery: Secondary | ICD-10-CM | POA: Diagnosis not present

## 2019-01-10 DIAGNOSIS — M170 Bilateral primary osteoarthritis of knee: Secondary | ICD-10-CM | POA: Diagnosis not present

## 2019-01-10 DIAGNOSIS — M25562 Pain in left knee: Secondary | ICD-10-CM | POA: Diagnosis not present

## 2019-01-24 DIAGNOSIS — Z0120 Encounter for dental examination and cleaning without abnormal findings: Secondary | ICD-10-CM | POA: Diagnosis not present

## 2019-03-22 DIAGNOSIS — E291 Testicular hypofunction: Secondary | ICD-10-CM | POA: Diagnosis not present

## 2019-03-22 DIAGNOSIS — I10 Essential (primary) hypertension: Secondary | ICD-10-CM | POA: Diagnosis not present

## 2019-03-22 DIAGNOSIS — E785 Hyperlipidemia, unspecified: Secondary | ICD-10-CM | POA: Diagnosis not present

## 2019-03-22 DIAGNOSIS — E1151 Type 2 diabetes mellitus with diabetic peripheral angiopathy without gangrene: Secondary | ICD-10-CM | POA: Diagnosis not present

## 2019-04-17 ENCOUNTER — Other Ambulatory Visit: Payer: Self-pay

## 2019-04-17 DIAGNOSIS — I6529 Occlusion and stenosis of unspecified carotid artery: Secondary | ICD-10-CM

## 2019-04-18 ENCOUNTER — Ambulatory Visit (HOSPITAL_COMMUNITY)
Admission: RE | Admit: 2019-04-18 | Discharge: 2019-04-18 | Disposition: A | Discharge status: Home / Self Care | Payer: Medicare Other | Source: Ambulatory Visit | Attending: Family | Admitting: Family

## 2019-04-18 ENCOUNTER — Encounter: Payer: Self-pay | Admitting: Vascular Surgery

## 2019-04-18 ENCOUNTER — Other Ambulatory Visit: Payer: Self-pay

## 2019-04-18 ENCOUNTER — Ambulatory Visit: Payer: Medicare Other | Admitting: Vascular Surgery

## 2019-04-18 VITALS — BP 180/97 | HR 66 | Temp 98.0°F | Resp 16 | Ht 72.0 in | Wt 197.0 lb

## 2019-04-18 DIAGNOSIS — I6522 Occlusion and stenosis of left carotid artery: Secondary | ICD-10-CM

## 2019-04-18 DIAGNOSIS — I6529 Occlusion and stenosis of unspecified carotid artery: Secondary | ICD-10-CM | POA: Diagnosis not present

## 2019-04-18 DIAGNOSIS — I10 Essential (primary) hypertension: Secondary | ICD-10-CM | POA: Diagnosis not present

## 2019-04-18 DIAGNOSIS — E119 Type 2 diabetes mellitus without complications: Secondary | ICD-10-CM | POA: Diagnosis not present

## 2019-04-18 DIAGNOSIS — R972 Elevated prostate specific antigen [PSA]: Secondary | ICD-10-CM | POA: Diagnosis not present

## 2019-04-18 DIAGNOSIS — E7849 Other hyperlipidemia: Secondary | ICD-10-CM | POA: Diagnosis not present

## 2019-04-18 NOTE — Progress Notes (Signed)
REASON FOR CONSULT:    Carotid disease.  The consult is requested by Dr. Adriana Mccallum.  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  He is asymptomatic.  If he does elect to undergo knee surgery I do not think he is at any increased risk for perioperative stroke with these findings.  Dr. Forde Dandy follows his carotid disease closely with yearly duplex scan and he will continue his follow-up with Dr. Forde Dandy.  Fortunately he quit smoking in 2011.  I encouraged him to stay as active as possible.  We would only consider addressing a carotid stenosis on the right if it progressed to greater than 80% or he develop new right hemispheric symptoms.  I will see him back as needed.  Deitra Mayo, MD, FACS Beeper [phone] Office: [phone]   HPI:   Austin Goodwin is a pleasant 68 y.o. male, who was referred for evaluation of carotid disease.  I have reviewed the records from the referring office.  The patient is being considered for knee surgery and has a known carotid occlusion and therefore was sent for vascular consultation to be sure that he was not an increased perioperative risk for stroke.  Patient developed to me that it back in 2011 he had several mini strokes associated with some disorientation but no focal weakness.  At that time he was diagnosed with a left carotid occlusion in [facility].  He quit smoking at that time.  Since then he has had no history of stroke, TIAs, expressive or receptive aphasia, or amaurosis fugax.  His risk factors for peripheral vascular disease include diabetes and a remote history of tobacco use.  In addition his father had heart disease at a young age.  He also has hypertension but denies significant hypercholesterolemia.  Past Medical History:  Diagnosis Date  . Diabetes mellitus without complication ([place])   . Hypertension      Family History  Problem Relation Age of Onset  . Mitral valve prolapse Mother   . Heart disease Father     SOCIAL HISTORY: Social History   Socioeconomic History  . Marital status: Married    Spouse name: Not on file  . Number of children: Not on file  . Years of education: Not on file  . Highest education level: Not on file  Occupational History  . Not on file  Social Needs  . Financial resource strain: Not on file  . Food insecurity    Worry: Not on file    Inability: Not on file  . Transportation needs    Medical: Not on file    Non-medical: Not on file  Tobacco Use  . Smoking status: Never Smoker  . Smokeless tobacco: Never Used  Substance and Sexual Activity  . Alcohol use: Yes  . Drug use: No  . Sexual activity: Not on file  Lifestyle  . Physical activity    Days per week: Not on file    Minutes per session: Not on file  . Stress: Not on file  Relationships  . Social Herbalist on phone: Not on file    Gets together: Not on file    Attends religious service: Not on file    Active member of club or organization: Not on file    Attends meetings of clubs or organizations: Not on file    Relationship  status: Not on file  . Intimate partner violence    Fear of current or ex partner: Not on file    Emotionally abused: Not on file    Physically abused: Not on file    Forced sexual activity: Not on file  Other Topics Concern  . Not on file  Social History Narrative  . Not on file    Allergies  Allergen Reactions  . Levofloxacin Hives  . Other     Current Outpatient Medications  Medication Sig Dispense Refill  . aspirin 81 MG chewable tablet Chew 81 mg by mouth at bedtime.    Marland Kitchen glipiZIDE (GLUCOTROL) 10 MG tablet Take 10 mg by mouth 2 (two) times daily before a meal.    . lisinopril (PRINIVIL,ZESTRIL) 10 MG tablet Take 10 mg by mouth 3 (three) times daily.    . metFORMIN (GLUCOPHAGE) 500 MG tablet Take 500 mg by mouth 2 (two) times daily  with a meal.    . oxymetazoline (AFRIN NASAL SPRAY) 0.05 % nasal spray Place 1 spray into both nostrils 2 (two) times daily. Spray once into each nostril twice daily for up to the next 3 days. Do not use for more than 3 days to prevent rebound rhinorrhea. 30 mL 0  . rosuvastatin (CRESTOR) 10 MG tablet Take 10 mg by mouth once a week. Wednesday     No current facility-administered medications for this visit.     REVIEW OF SYSTEMS:  [X]  denotes positive finding, [ ]  denotes negative finding Cardiac  Comments:  Chest pain or chest pressure:    Shortness of breath upon exertion:    Short of breath when lying flat:    Irregular heart rhythm:        Vascular    Pain in calf, thigh, or hip brought on by ambulation:    Pain in feet at night that wakes you up from your sleep:     Blood clot in your veins:    Leg swelling:         Pulmonary    Oxygen at home:    Productive cough:     Wheezing:         Neurologic    Sudden weakness in arms or legs:     Sudden numbness in arms or legs:     Sudden onset of difficulty speaking or slurred speech:    Temporary loss of vision in one eye:     Problems with dizziness:         Gastrointestinal    Blood in stool:     Vomited blood:         Genitourinary    Burning when urinating:     Blood in urine:        Psychiatric    Major depression:         Hematologic    Bleeding problems:    Problems with blood clotting too easily:        Skin    Rashes or ulcers:        Constitutional    Fever or chills:     PHYSICAL EXAM:   Vitals:   04/18/19 1120 04/18/19 1126 04/18/19 1128 04/18/19 1130  BP: (!) 194/86 (!) 175/84 (!) 191/88 (!) 180/97  Pulse: 66 66 66 66  Resp: 16     Temp: 98 F (36.7 C)     TempSrc: Temporal     SpO2: 97%     Weight: 197 lb (89.4 kg)  Height: 6' (1.829 m)       GENERAL: The patient is a well-nourished male, in no acute distress. The vital signs are documented above. CARDIAC: There is a regular rate  and rhythm.  VASCULAR: I do not detect carotid bruits. He has palpable femoral and pedal pulses bilaterally. PULMONARY: There is good air exchange bilaterally without wheezing or rales. ABDOMEN: Soft and non-tender with normal pitched bowel sounds.  MUSCULOSKELETAL: There are no major deformities or cyanosis. NEUROLOGIC: No focal weakness or paresthesias are detected. SKIN: There are no ulcers or rashes noted. PSYCHIATRIC: The patient has a normal affect.  DATA:    CAROTID DUPLEX: I have independently interpreted his carotid duplex scan today.  On the right side, there is no evidence of significant carotid disease.  The right vertebral artery is patent with antegrade flow.  On the left side the internal carotid artery is occluded.  The left vertebral artery is patent with antegrade flow.

## 2019-05-04 DIAGNOSIS — I10 Essential (primary) hypertension: Secondary | ICD-10-CM | POA: Diagnosis not present

## 2019-05-04 DIAGNOSIS — R519 Headache, unspecified: Secondary | ICD-10-CM | POA: Diagnosis not present

## 2019-05-04 DIAGNOSIS — E1151 Type 2 diabetes mellitus with diabetic peripheral angiopathy without gangrene: Secondary | ICD-10-CM | POA: Diagnosis not present

## 2019-05-04 DIAGNOSIS — I6529 Occlusion and stenosis of unspecified carotid artery: Secondary | ICD-10-CM | POA: Diagnosis not present

## 2019-05-31 DIAGNOSIS — H903 Sensorineural hearing loss, bilateral: Secondary | ICD-10-CM | POA: Diagnosis not present

## 2019-06-29 ENCOUNTER — Encounter (HOSPITAL_COMMUNITY): Payer: Self-pay

## 2019-06-29 ENCOUNTER — Other Ambulatory Visit: Payer: Self-pay

## 2019-06-29 ENCOUNTER — Inpatient Hospital Stay (HOSPITAL_COMMUNITY)
Admission: EM | Admit: 2019-06-29 | Discharge: 2019-06-30 | DRG: 683 | Discharge status: Against Medical Advice | Payer: Medicare Other | Attending: Internal Medicine | Admitting: Internal Medicine

## 2019-06-29 DIAGNOSIS — N179 Acute kidney failure, unspecified: Principal | ICD-10-CM | POA: Diagnosis present

## 2019-06-29 DIAGNOSIS — Z79899 Other long term (current) drug therapy: Secondary | ICD-10-CM

## 2019-06-29 DIAGNOSIS — I10 Essential (primary) hypertension: Secondary | ICD-10-CM | POA: Diagnosis not present

## 2019-06-29 DIAGNOSIS — Z888 Allergy status to other drugs, medicaments and biological substances status: Secondary | ICD-10-CM | POA: Diagnosis not present

## 2019-06-29 DIAGNOSIS — E872 Acidosis: Secondary | ICD-10-CM | POA: Diagnosis not present

## 2019-06-29 DIAGNOSIS — R6889 Other general symptoms and signs: Secondary | ICD-10-CM | POA: Diagnosis not present

## 2019-06-29 DIAGNOSIS — E1165 Type 2 diabetes mellitus with hyperglycemia: Secondary | ICD-10-CM | POA: Diagnosis not present

## 2019-06-29 DIAGNOSIS — Z20828 Contact with and (suspected) exposure to other viral communicable diseases: Secondary | ICD-10-CM | POA: Diagnosis present

## 2019-06-29 DIAGNOSIS — R5383 Other fatigue: Secondary | ICD-10-CM | POA: Diagnosis not present

## 2019-06-29 DIAGNOSIS — Z881 Allergy status to other antibiotic agents status: Secondary | ICD-10-CM | POA: Diagnosis not present

## 2019-06-29 DIAGNOSIS — Z7982 Long term (current) use of aspirin: Secondary | ICD-10-CM | POA: Diagnosis not present

## 2019-06-29 DIAGNOSIS — R05 Cough: Secondary | ICD-10-CM | POA: Diagnosis not present

## 2019-06-29 DIAGNOSIS — Z8249 Family history of ischemic heart disease and other diseases of the circulatory system: Secondary | ICD-10-CM | POA: Diagnosis not present

## 2019-06-29 DIAGNOSIS — R739 Hyperglycemia, unspecified: Secondary | ICD-10-CM | POA: Diagnosis not present

## 2019-06-29 DIAGNOSIS — Z79891 Long term (current) use of opiate analgesic: Secondary | ICD-10-CM | POA: Diagnosis not present

## 2019-06-29 DIAGNOSIS — Z7984 Long term (current) use of oral hypoglycemic drugs: Secondary | ICD-10-CM | POA: Diagnosis not present

## 2019-06-29 DIAGNOSIS — N390 Urinary tract infection, site not specified: Secondary | ICD-10-CM | POA: Diagnosis not present

## 2019-06-29 LAB — URINALYSIS, ROUTINE W REFLEX MICROSCOPIC
Bilirubin Urine: NEGATIVE
Glucose, UA: 150 mg/dL — AB
Ketones, ur: NEGATIVE mg/dL
Nitrite: NEGATIVE
Protein, ur: 30 mg/dL — AB
Specific Gravity, Urine: 1.011 (ref 1.005–1.030)
pH: 5 (ref 5.0–8.0)

## 2019-06-29 LAB — COMPREHENSIVE METABOLIC PANEL
ALT: 26 U/L (ref 0–44)
AST: 29 U/L (ref 15–41)
Albumin: 2.9 g/dL — ABNORMAL LOW (ref 3.5–5.0)
Alkaline Phosphatase: 112 U/L (ref 38–126)
Anion gap: 16 — ABNORMAL HIGH (ref 5–15)
BUN: 97 mg/dL — ABNORMAL HIGH (ref 8–23)
CO2: 18 mmol/L — ABNORMAL LOW (ref 22–32)
Calcium: 9.1 mg/dL (ref 8.9–10.3)
Chloride: 102 mmol/L (ref 98–111)
Creatinine, Ser: 4.9 mg/dL — ABNORMAL HIGH (ref 0.61–1.24)
GFR calc Af Amer: 13 mL/min — ABNORMAL LOW (ref >60)
GFR calc non Af Amer: 11 mL/min — ABNORMAL LOW (ref >60)
Glucose, Bld: 334 mg/dL — ABNORMAL HIGH (ref 70–99)
Potassium: 5 mmol/L (ref 3.5–5.1)
Sodium: 136 mmol/L (ref 135–145)
Total Bilirubin: 1.4 mg/dL — ABNORMAL HIGH (ref 0.3–1.2)
Total Protein: 7 g/dL (ref 6.5–8.1)

## 2019-06-29 LAB — CBC
HCT: 40 % (ref 39.0–52.0)
Hemoglobin: 13.7 g/dL (ref 13.0–17.0)
MCH: 35.2 pg — ABNORMAL HIGH (ref 26.0–34.0)
MCHC: 34.3 g/dL (ref 30.0–36.0)
MCV: 102.8 fL — ABNORMAL HIGH (ref 80.0–100.0)
Platelets: 182 10*3/uL (ref 150–400)
RBC: 3.89 MIL/uL — ABNORMAL LOW (ref 4.22–5.81)
RDW: 13.2 % (ref 11.5–15.5)
WBC: 15.3 10*3/uL — ABNORMAL HIGH (ref 4.0–10.5)
nRBC: 0 % (ref 0.0–0.2)

## 2019-06-29 LAB — CBG MONITORING, ED
Glucose-Capillary: 247 mg/dL — ABNORMAL HIGH (ref 70–99)
Glucose-Capillary: 297 mg/dL — ABNORMAL HIGH (ref 70–99)
Glucose-Capillary: 331 mg/dL — ABNORMAL HIGH (ref 70–99)

## 2019-06-29 MED ORDER — SODIUM CHLORIDE 0.9 % IV SOLN
Freq: Once | INTRAVENOUS | Status: AC
  Administered 2019-06-29: 21:00:00 via INTRAVENOUS

## 2019-06-29 NOTE — ED Provider Notes (Signed)
[facility] DEPT Provider Note   CSN: [id number] Arrival date & time: 06/29/19  1936     History   Chief Complaint Chief Complaint  Patient presents with  . Hyperglycemia    HPI Austin Goodwin is a 68 y.o. male.     68 year old male with prior medical history as detailed below presents for evaluation of hyperglycemia and suspected acute renal failure.  Patient reports that he was given medical clinic earlier today for routine checkup.  He reports that he was called this afternoon and told to come to the ED given lab values suggesting hyperglycemia and acute renal failure.  Patient denies prior history of renal disease.  Patient reports feeling mildly fatigued over the last several days.  He denies fever.  He denies chest pain shortness of breath.  He denies other concurrent complaint.  The history is provided by the patient.  Illness Location:  Hyperglycemia, acute renal failure  Severity:  Mild Onset quality:  Sudden Duration:  3 days Timing:  Constant Progression:  Unchanged Chronicity:  New   Past Medical History:  Diagnosis Date  . Diabetes mellitus without complication ([place])   . Hypertension     There are no active problems to display for this patient.   Past Surgical History:  Procedure Laterality Date  . CATARACT EXTRACTION, BILATERAL          Home Medications    Prior to Admission medications   Medication Sig Start Date End Date Taking? Authorizing Provider  amLODipine (NORVASC) 5 MG tablet Take 5 mg by mouth daily. 06/12/19  Yes [provider]  aspirin 81 MG chewable tablet Chew 81 mg by mouth at bedtime.   Yes [provider]  doxycycline (VIBRA-TABS) 100 MG tablet Take 100 mg by mouth 2 (two) times daily. Will end 07/02/2019 06/25/19  Yes [provider]  glipiZIDE (GLUCOTROL) 10 MG tablet Take 10 mg by mouth 2 (two) times daily before a meal.   Yes [provider]  lisinopril  (PRINIVIL,ZESTRIL) 10 MG tablet Take 10 mg by mouth 3 (three) times daily.   Yes [provider]  metFORMIN (GLUCOPHAGE) 500 MG tablet Take 500 mg by mouth 2 (two) times daily with a meal.   Yes [provider]  omeprazole (PRILOSEC) 20 MG capsule Take 20 mg by mouth daily as needed (heart burn).  06/12/19  Yes [provider]  rosuvastatin (CRESTOR) 10 MG tablet Take 10 mg by mouth once a week. Wednesday   Yes [provider]  traMADol (ULTRAM) 50 MG tablet Take 50 mg by mouth 2 (two) times daily as needed for pain. 06/18/19  Yes [provider]  oxymetazoline (AFRIN NASAL SPRAY) 0.05 % nasal spray Place 1 spray into both nostrils 2 (two) times daily. Spray once into each nostril twice daily for up to the next 3 days. Do not use for more than 3 days to prevent rebound rhinorrhea. Patient not taking: Reported on 06/29/2019 09/09/16   Nona Dell, PA-C    Family History Family History  Problem Relation Age of Onset  . Mitral valve prolapse Mother   . Heart disease Father     Social History Social History   Tobacco Use  . Smoking status: Never Smoker  . Smokeless tobacco: Never Used  Substance Use Topics  . Alcohol use: Yes  . Drug use: No     Allergies   Levofloxacin and Other   Review of Systems Review of Systems  All other systems reviewed and are negative.    Physical Exam Updated Vital Signs BP (!) 154/76 (BP Location: Left Arm)   Pulse 86   Temp 97.8 F (36.6 C) (Oral)   Resp 17   Ht 6' (1.829 m)   Wt 86.2 kg   SpO2 95%   BMI 25.77 kg/m   Physical Exam Vitals signs and nursing note reviewed.  Constitutional:      General: He is not in acute distress.    Appearance: Normal appearance. He is well-developed.  HENT:     Head: Normocephalic and atraumatic.  Eyes:     Conjunctiva/sclera: Conjunctivae normal.     Pupils: Pupils are equal, round, and reactive to light.  Neck:     Musculoskeletal: Normal  range of motion and neck supple.  Cardiovascular:     Rate and Rhythm: Normal rate and regular rhythm.     Heart sounds: Normal heart sounds.  Pulmonary:     Effort: Pulmonary effort is normal. No respiratory distress.     Breath sounds: Normal breath sounds.  Abdominal:     General: There is no distension.     Palpations: Abdomen is soft.     Tenderness: There is no abdominal tenderness.  Musculoskeletal: Normal range of motion.        General: No deformity.  Skin:    General: Skin is warm and dry.  Neurological:     Mental Status: He is alert and oriented to person, place, and time.      ED Treatments / Results  Labs (all labs ordered are listed, but only abnormal results are displayed) Labs Reviewed  CBC - Abnormal; Notable for the following components:      Result Value   WBC 15.3 (*)    RBC 3.89 (*)    MCV 102.8 (*)    MCH 35.2 (*)    All other components within normal limits  COMPREHENSIVE METABOLIC PANEL - Abnormal; Notable for the following components:   CO2 18 (*)    Glucose, Bld 334 (*)    BUN 97 (*)    Creatinine, Ser 4.90 (*)    Albumin 2.9 (*)    Total Bilirubin 1.4 (*)    GFR calc non Af Amer 11 (*)    GFR calc Af Amer 13 (*)    Anion gap 16 (*)    All other components within normal limits  CBG MONITORING, ED - Abnormal; Notable for the following components:   Glucose-Capillary 331 (*)    All other components within normal limits  CBG MONITORING, ED - Abnormal; Notable for the following components:   Glucose-Capillary 297 (*)    All other components within normal limits  URINALYSIS, ROUTINE W REFLEX MICROSCOPIC    EKG None  Radiology No results found.  Procedures Procedures (including critical care time)  Medications Ordered in ED Medications  0.9 %  sodium chloride infusion ( Intravenous New Bag/Given 06/29/19 2127)     Initial Impression / Assessment and Plan / ED Course  I have reviewed the triage vital signs and the nursing notes.   Pertinent labs & imaging results that were available during my care of the patient were reviewed by me and considered in my medical decision making (see chart for details).        MDM  Screen complete  Austin Goodwin was evaluated in Emergency Department on 06/29/2019 for the symptoms described in the history of present illness. He was evaluated in the context  of the global COVID-19 pandemic, which necessitated consideration that the patient might be at risk for infection with the SARS-CoV-2 virus that causes COVID-19. Institutional protocols and algorithms that pertain to the evaluation of patients at risk for COVID-19 are in a state of rapid change based on information released by regulatory bodies including the CDC and federal and state organizations. These policies and algorithms were followed during the patient's care in the ED.  Patient is presenting for evaluation of hyperglycemia and reported renal failure.  Patient is mildly symptomatic with reported symptoms of fatigue.  Labs reveal elevated creatinine (uncertain timeframe) and elevated BG.   Hospitalist service (Dr. Tonye Royalty) is aware of case and will evaluate for admission.   Final Clinical Impressions(s) / ED Diagnoses   Final diagnoses:  Acute renal failure, unspecified acute renal failure type [facility])    ED Discharge Orders    None       Valarie Merino, MD 06/29/19 2212

## 2019-06-29 NOTE — ED Triage Notes (Signed)
Pt coming from [facility] today to be seen for hyperglycemia and acute kidney failure. Sugar was 410 at pcp. Pt takes metformin. Pt states he just feels weak

## 2019-06-30 DIAGNOSIS — E1165 Type 2 diabetes mellitus with hyperglycemia: Secondary | ICD-10-CM | POA: Diagnosis not present

## 2019-06-30 DIAGNOSIS — Z20828 Contact with and (suspected) exposure to other viral communicable diseases: Secondary | ICD-10-CM | POA: Diagnosis not present

## 2019-06-30 DIAGNOSIS — Z7982 Long term (current) use of aspirin: Secondary | ICD-10-CM | POA: Diagnosis not present

## 2019-06-30 DIAGNOSIS — Z881 Allergy status to other antibiotic agents status: Secondary | ICD-10-CM | POA: Diagnosis not present

## 2019-06-30 DIAGNOSIS — I10 Essential (primary) hypertension: Secondary | ICD-10-CM | POA: Diagnosis not present

## 2019-06-30 DIAGNOSIS — N179 Acute kidney failure, unspecified: Secondary | ICD-10-CM | POA: Diagnosis not present

## 2019-06-30 DIAGNOSIS — Z79899 Other long term (current) drug therapy: Secondary | ICD-10-CM | POA: Diagnosis not present

## 2019-06-30 DIAGNOSIS — Z8249 Family history of ischemic heart disease and other diseases of the circulatory system: Secondary | ICD-10-CM | POA: Diagnosis not present

## 2019-06-30 DIAGNOSIS — E872 Acidosis: Secondary | ICD-10-CM | POA: Diagnosis not present

## 2019-06-30 DIAGNOSIS — Z7984 Long term (current) use of oral hypoglycemic drugs: Secondary | ICD-10-CM | POA: Diagnosis not present

## 2019-06-30 DIAGNOSIS — R739 Hyperglycemia, unspecified: Secondary | ICD-10-CM | POA: Diagnosis present

## 2019-06-30 DIAGNOSIS — Z888 Allergy status to other drugs, medicaments and biological substances status: Secondary | ICD-10-CM | POA: Diagnosis not present

## 2019-06-30 DIAGNOSIS — Z79891 Long term (current) use of opiate analgesic: Secondary | ICD-10-CM | POA: Diagnosis not present

## 2019-06-30 LAB — CREATININE, SERUM
Creatinine, Ser: 5.06 mg/dL — ABNORMAL HIGH (ref 0.61–1.24)
GFR calc Af Amer: 12 mL/min — ABNORMAL LOW (ref >60)
GFR calc non Af Amer: 11 mL/min — ABNORMAL LOW (ref >60)

## 2019-06-30 LAB — CBC
HCT: 35.3 % — ABNORMAL LOW (ref 39.0–52.0)
Hemoglobin: 11.9 g/dL — ABNORMAL LOW (ref 13.0–17.0)
MCH: 34.8 pg — ABNORMAL HIGH (ref 26.0–34.0)
MCHC: 33.7 g/dL (ref 30.0–36.0)
MCV: 103.2 fL — ABNORMAL HIGH (ref 80.0–100.0)
Platelets: 157 10*3/uL (ref 150–400)
RBC: 3.42 MIL/uL — ABNORMAL LOW (ref 4.22–5.81)
RDW: 13.2 % (ref 11.5–15.5)
WBC: 12.9 10*3/uL — ABNORMAL HIGH (ref 4.0–10.5)
nRBC: 0 % (ref 0.0–0.2)

## 2019-06-30 LAB — HIV ANTIBODY (ROUTINE TESTING W REFLEX): HIV Screen 4th Generation wRfx: NONREACTIVE

## 2019-06-30 LAB — BASIC METABOLIC PANEL
Anion gap: 14 (ref 5–15)
BUN: 97 mg/dL — ABNORMAL HIGH (ref 8–23)
CO2: 19 mmol/L — ABNORMAL LOW (ref 22–32)
Calcium: 8.3 mg/dL — ABNORMAL LOW (ref 8.9–10.3)
Chloride: 105 mmol/L (ref 98–111)
Creatinine, Ser: 4.56 mg/dL — ABNORMAL HIGH (ref 0.61–1.24)
GFR calc Af Amer: 14 mL/min — ABNORMAL LOW (ref >60)
GFR calc non Af Amer: 12 mL/min — ABNORMAL LOW (ref >60)
Glucose, Bld: 290 mg/dL — ABNORMAL HIGH (ref 70–99)
Potassium: 4 mmol/L (ref 3.5–5.1)
Sodium: 138 mmol/L (ref 135–145)

## 2019-06-30 LAB — HEMOGLOBIN A1C
Hgb A1c MFr Bld: 7.9 % — ABNORMAL HIGH (ref 4.8–5.6)
Mean Plasma Glucose: 180.03 mg/dL

## 2019-06-30 LAB — SARS CORONAVIRUS 2 (TAT 6-24 HRS): SARS Coronavirus 2: NEGATIVE

## 2019-06-30 LAB — CBG MONITORING, ED: Glucose-Capillary: 255 mg/dL — ABNORMAL HIGH (ref 70–99)

## 2019-06-30 MED ORDER — FOLIC ACID 1 MG PO TABS: 1.0000 mg | ORAL_TABLET | Freq: Every day | ORAL | Status: DC

## 2019-06-30 MED ORDER — INSULIN ASPART 100 UNIT/ML ~~LOC~~ SOLN
0.0000 [IU] | Freq: Three times a day (TID) | SUBCUTANEOUS | Status: DC
  Filled 2019-06-30: qty 0.15

## 2019-06-30 MED ORDER — INSULIN GLARGINE 100 UNIT/ML ~~LOC~~ SOLN
10.0000 [IU] | Freq: Every day | SUBCUTANEOUS | Status: DC
  Administered 2019-06-30: 10 [IU] via SUBCUTANEOUS
  Filled 2019-06-30: qty 0.1

## 2019-06-30 MED ORDER — ENOXAPARIN SODIUM 30 MG/0.3ML ~~LOC~~ SOLN
30.0000 mg | Freq: Every day | SUBCUTANEOUS | Status: DC
  Administered 2019-06-30: 30 mg via SUBCUTANEOUS
  Filled 2019-06-30: qty 0.3

## 2019-06-30 MED ORDER — ONDANSETRON HCL 4 MG/2ML IJ SOLN: 4.0000 mg | Freq: Four times a day (QID) | INTRAMUSCULAR | Status: DC | PRN

## 2019-06-30 MED ORDER — ONDANSETRON HCL 4 MG PO TABS: 4.0000 mg | ORAL_TABLET | Freq: Four times a day (QID) | ORAL | Status: DC | PRN

## 2019-06-30 MED ORDER — POTASSIUM CHLORIDE IN NACL 20-0.9 MEQ/L-% IV SOLN
INTRAVENOUS | Status: DC
  Administered 2019-06-30: 02:00:00 via INTRAVENOUS
  Filled 2019-06-30 (×2): qty 1000

## 2019-06-30 MED ORDER — ADULT MULTIVITAMIN W/MINERALS CH: 1.0000 | ORAL_TABLET | Freq: Every day | ORAL | Status: DC

## 2019-06-30 NOTE — Progress Notes (Signed)
Care started prior to midnight in the emergency room yesterday and patient was admitted early this morning after midnight by Dr. Hulan Saas and I am in current agreement with his assessment and plan.  Additional changes of the plan of care were to be made accordingly however it appears the patient has signed himself out of the emergency room and walked out prior to me even evaluating the patient.  I spoke with the ED nurse Ronalee Belts and the nurse states that he explained the risks and benefits to the patient and the patient decided to leave [place] despite being admitted. Patient did not even get to the floor and left AMA while still being roomed in the ED.

## 2019-06-30 NOTE — H&P (Signed)
History and Physical    Austin Goodwin EXB:[id number] DOB: 09/22/1950 DOA: 06/29/2019  PCP:  I have personally briefly reviewed patient's emergency room record and notes  Chief Complaint: Called by clinic to go to emergency room for acute kidney injury  HPI: Austin Goodwin is a 68 y.o. male with medical history significant for diabetes and hypertension who was seen at [facility] and some lab work was done.. On his way home he received a call to go to the emergency room since his kidney function had deteriorated considerably.  His last kidney function tests were performed a few years ago and were noted to be normal.. Patient has no significant acute symptoms however he does feel somewhat fatigued for the last few days.  Sugars have been significantly elevated.  ED Course: He has been provided IV fluids and is quite comfortable at this time.  Review of Systems: As per HPI otherwise 10 point review of systems negative.Marland Kitchen Specifically he has no chest pain. No shortness of breath noted at rest. No abdominal pain.  Loss of appetite but no nausea or vomiting.. No significant change in bowel habit.Marland Kitchen He does feel thirsty and has had pyuria.. He has had a healthy appetite also.Marland Kitchen He denies any dizziness headaches or focal neurologic symptoms..   Past Medical History:  Diagnosis Date  . Diabetes mellitus without complication ([place])   . Hypertension     Past Surgical History:  Procedure Laterality Date  . CATARACT EXTRACTION, BILATERAL       reports that he has never smoked. He has never used smokeless tobacco. He reports current alcohol use. He reports that he does not use drugs.  Allergies  Allergen Reactions  . Levofloxacin Hives  . Other     Family History  Problem Relation Age of Onset  . Mitral valve prolapse Mother   . Heart disease Father     Prior to Admission medications   Medication Sig Start Date End Date Taking? Authorizing Provider  amLODipine  (NORVASC) 5 MG tablet Take 5 mg by mouth daily. 06/12/19  Yes [provider]  aspirin 81 MG chewable tablet Chew 81 mg by mouth at bedtime.   Yes [provider]  doxycycline (VIBRA-TABS) 100 MG tablet Take 100 mg by mouth 2 (two) times daily. Will end 07/02/2019 06/25/19  Yes [provider]  glipiZIDE (GLUCOTROL) 10 MG tablet Take 10 mg by mouth 2 (two) times daily before a meal.   Yes [provider]  lisinopril (PRINIVIL,ZESTRIL) 10 MG tablet Take 10 mg by mouth 3 (three) times daily.   Yes [provider]  metFORMIN (GLUCOPHAGE) 500 MG tablet Take 500 mg by mouth 2 (two) times daily with a meal.   Yes [provider]  omeprazole (PRILOSEC) 20 MG capsule Take 20 mg by mouth daily as needed (heart burn).  06/12/19  Yes [provider]  rosuvastatin (CRESTOR) 10 MG tablet Take 10 mg by mouth once a week. Wednesday   Yes [provider]  traMADol (ULTRAM) 50 MG tablet Take 50 mg by mouth 2 (two) times daily as needed for pain. 06/18/19  Yes [provider]  oxymetazoline (AFRIN NASAL SPRAY) 0.05 % nasal spray Place 1 spray into both nostrils 2 (two) times daily. Spray once into each nostril twice daily for up to the next 3 days. Do not use for more than 3 days to prevent rebound rhinorrhea. Patient not taking: Reported on 06/29/2019 09/09/16   Nona Dell, PA-C  Physical Exam: Vitals:   06/29/19 2200 06/29/19 2230 06/29/19 2330 06/30/19 0000  BP: (!) 154/76 (!) 153/80 (!) 167/80 (!) 167/76  Pulse: 86 81 94 91  Resp: 17 18 18 18   Temp:      TempSrc:      SpO2: 95% 96% 98% 94%  Weight:      Height:      Skin: Turgor is fair. Eyes: PERRLA. ENT: Moist mucous membranes.  No exudates noted. Neck: Supple.  No JVD or audible bruits. Chest: Symmetric normal bilateral movements. Abdomen: Somewhat protuberant but nontender with good bowel sounds. Heart has regular rhythm: I could not any gallops or  murmurs. Extremities: No edema clubbing or cyanosis noted. Neurologically he is grossly intact.  Alert oriented and insightful.. Peripheral pulses are bilaterally diminished in the lower extremities. Constitutional: NAD, calm, comfortable.  Betrays no anxiety. Vitals:   06/29/19 2200 06/29/19 2230 06/29/19 2330 06/30/19 0000  BP: (!) 154/76 (!) 153/80 (!) 167/80 (!) 167/76  Pulse: 86 81 94 91  Resp: 17 18 18 18   Temp:      TempSrc:      SpO2: 95% 96% 98% 94%  Weight:      Height:      (Labs on Admission: I have personally reviewed following labs and imaging studies Elevated WBC.  Differential count is pending. His GFR is markedly diminished and creatinine clearance is down to 16 cc/min.  He is somewhat acidotic with a carbon dioxide of 18 and blood sugar of 334.  His albumin is somewhat low at 2.9.  Liver enzymes are normal. Small amount of leukocyte Estrace is noted in the urine.  No ketonuria noted. CBC: Recent Labs  Lab 06/29/19 1947  WBC 15.3*  HGB 13.7  HCT 40.0  MCV 102.8*  PLT 628   Basic Metabolic Panel: Recent Labs  Lab 06/29/19 2002  NA 136  K 5.0  CL 102  CO2 18*  GLUCOSE 334*  BUN 97*  CREATININE 4.90*  CALCIUM 9.1   GFR: Estimated Creatinine Clearance: 15.6 mL/min (A) (by C-G formula based on SCr of 4.9 mg/dL (H)). Liver Function Tests: Recent Labs  Lab 06/29/19 2002  AST 29  ALT 26  ALKPHOS 112  BILITOT 1.4*  PROT 7.0  ALBUMIN 2.9*   No results for input(s): LIPASE, AMYLASE in the last 168 hours. No results for input(s): AMMONIA in the last 168 hours. Coagulation Profile: No results for input(s): INR, PROTIME in the last 168 hours. Cardiac Enzymes: No results for input(s): CKTOTAL, CKMB, CKMBINDEX, TROPONINI in the last 168 hours. BNP (last 3 results) No results for input(s): PROBNP in the last 8760 hours. HbA1C: No results for input(s): HGBA1C in the last 72 hours. CBG: Recent Labs  Lab 06/29/19 1946 06/29/19 2006 06/29/19 2331   GLUCAP 331* 297* 247*   Lipid Profile: No results for input(s): CHOL, HDL, LDLCALC, TRIG, CHOLHDL, LDLDIRECT in the last 72 hours. Thyroid Function Tests: No results for input(s): TSH, T4TOTAL, FREET4, T3FREE, THYROIDAB in the last 72 hours. Anemia Panel: No results for input(s): VITAMINB12, FOLATE, FERRITIN, TIBC, IRON, RETICCTPCT in the last 72 hours. Urine analysis:    Component Value Date/Time   COLORURINE YELLOW 06/29/2019 1947   APPEARANCEUR CLEAR 06/29/2019 1947   LABSPEC 1.011 06/29/2019 1947   PHURINE 5.0 06/29/2019 1947   GLUCOSEU 150 (A) 06/29/2019 1947   HGBUR MODERATE (A) 06/29/2019 1947   BILIRUBINUR NEGATIVE 06/29/2019 [place] NEGATIVE 06/29/2019 1947   PROTEINUR 30 (A) 06/29/2019  [place] 06/29/2019 1947   LEUKOCYTESUR SMALL (A) 06/29/2019 1947    Radiological Exams on Admission: No results found.  EKG: NA  Assessment/Plan. #1 acute kidney injury likely on chronic kidney disease.  Extent of the CKD is unknown.   #2 hyperglycemia with nonketotic acidosis. Will review patient's medications and aggressively hydrate.  At this point I do not feel the need for IV insulin will cover with sliding scale NovoLog.  It is noted that he has been on Glucophage that has been held.  Frequent metabolic panels will be checked.  Nephrology consult will be requested by the day team in the morning.  DVT prophylaxis: Lovenox Code Status: Full Family Communication: No family present at bedside Disposition Plan: Home with self-care Consults called:  Admission status: Full   Pearlean Brownie MD [facility] Pager [phone]   If 7PM-7AM, please contact night-coverage [email or web address] [facility]  06/30/2019, 12:23 AM

## 2019-07-02 ENCOUNTER — Inpatient Hospital Stay
Admission: EM | Admit: 2019-07-02 | Discharge: 2019-07-04 | DRG: 683 | Disposition: A | Discharge status: Home / Self Care | Payer: Medicare Other | Attending: Internal Medicine | Admitting: Internal Medicine

## 2019-07-02 ENCOUNTER — Encounter: Payer: Self-pay | Admitting: Emergency Medicine

## 2019-07-02 ENCOUNTER — Other Ambulatory Visit: Payer: Self-pay

## 2019-07-02 DIAGNOSIS — I10 Essential (primary) hypertension: Secondary | ICD-10-CM | POA: Diagnosis not present

## 2019-07-02 DIAGNOSIS — R310 Gross hematuria: Secondary | ICD-10-CM | POA: Diagnosis present

## 2019-07-02 DIAGNOSIS — N2889 Other specified disorders of kidney and ureter: Secondary | ICD-10-CM | POA: Diagnosis not present

## 2019-07-02 DIAGNOSIS — I129 Hypertensive chronic kidney disease with stage 1 through stage 4 chronic kidney disease, or unspecified chronic kidney disease: Secondary | ICD-10-CM | POA: Diagnosis present

## 2019-07-02 DIAGNOSIS — E1122 Type 2 diabetes mellitus with diabetic chronic kidney disease: Secondary | ICD-10-CM

## 2019-07-02 DIAGNOSIS — R531 Weakness: Secondary | ICD-10-CM

## 2019-07-02 DIAGNOSIS — E785 Hyperlipidemia, unspecified: Secondary | ICD-10-CM | POA: Diagnosis not present

## 2019-07-02 DIAGNOSIS — E860 Dehydration: Secondary | ICD-10-CM | POA: Diagnosis not present

## 2019-07-02 DIAGNOSIS — N189 Chronic kidney disease, unspecified: Secondary | ICD-10-CM | POA: Diagnosis present

## 2019-07-02 DIAGNOSIS — Z8673 Personal history of transient ischemic attack (TIA), and cerebral infarction without residual deficits: Secondary | ICD-10-CM | POA: Diagnosis not present

## 2019-07-02 DIAGNOSIS — D472 Monoclonal gammopathy: Secondary | ICD-10-CM | POA: Diagnosis present

## 2019-07-02 DIAGNOSIS — E1165 Type 2 diabetes mellitus with hyperglycemia: Secondary | ICD-10-CM | POA: Diagnosis present

## 2019-07-02 DIAGNOSIS — N179 Acute kidney failure, unspecified: Secondary | ICD-10-CM | POA: Diagnosis not present

## 2019-07-02 DIAGNOSIS — Z87891 Personal history of nicotine dependence: Secondary | ICD-10-CM | POA: Diagnosis not present

## 2019-07-02 DIAGNOSIS — Z8249 Family history of ischemic heart disease and other diseases of the circulatory system: Secondary | ICD-10-CM | POA: Diagnosis not present

## 2019-07-02 DIAGNOSIS — I69351 Hemiplegia and hemiparesis following cerebral infarction affecting right dominant side: Secondary | ICD-10-CM

## 2019-07-02 DIAGNOSIS — Z79899 Other long term (current) drug therapy: Secondary | ICD-10-CM

## 2019-07-02 DIAGNOSIS — K8020 Calculus of gallbladder without cholecystitis without obstruction: Secondary | ICD-10-CM | POA: Diagnosis not present

## 2019-07-02 DIAGNOSIS — E1169 Type 2 diabetes mellitus with other specified complication: Secondary | ICD-10-CM | POA: Diagnosis not present

## 2019-07-02 DIAGNOSIS — Z7982 Long term (current) use of aspirin: Secondary | ICD-10-CM | POA: Diagnosis not present

## 2019-07-02 DIAGNOSIS — K219 Gastro-esophageal reflux disease without esophagitis: Secondary | ICD-10-CM

## 2019-07-02 DIAGNOSIS — N281 Cyst of kidney, acquired: Secondary | ICD-10-CM | POA: Diagnosis present

## 2019-07-02 DIAGNOSIS — Z7984 Long term (current) use of oral hypoglycemic drugs: Secondary | ICD-10-CM | POA: Diagnosis not present

## 2019-07-02 DIAGNOSIS — Z20828 Contact with and (suspected) exposure to other viral communicable diseases: Secondary | ICD-10-CM | POA: Diagnosis present

## 2019-07-02 DIAGNOSIS — E875 Hyperkalemia: Secondary | ICD-10-CM | POA: Diagnosis not present

## 2019-07-02 DIAGNOSIS — R319 Hematuria, unspecified: Secondary | ICD-10-CM | POA: Diagnosis present

## 2019-07-02 LAB — CBC WITH DIFFERENTIAL/PLATELET
Abs Immature Granulocytes: 0.15 10*3/uL — ABNORMAL HIGH (ref 0.00–0.07)
Basophils Absolute: 0 10*3/uL (ref 0.0–0.1)
Basophils Relative: 0 %
Eosinophils Absolute: 0 10*3/uL (ref 0.0–0.5)
Eosinophils Relative: 0 %
HCT: 42.2 % (ref 39.0–52.0)
Hemoglobin: 14.2 g/dL (ref 13.0–17.0)
Immature Granulocytes: 1 %
Lymphocytes Relative: 9 %
Lymphs Abs: 1.2 10*3/uL (ref 0.7–4.0)
MCH: 34.8 pg — ABNORMAL HIGH (ref 26.0–34.0)
MCHC: 33.6 g/dL (ref 30.0–36.0)
MCV: 103.4 fL — ABNORMAL HIGH (ref 80.0–100.0)
Monocytes Absolute: 1.2 10*3/uL — ABNORMAL HIGH (ref 0.1–1.0)
Monocytes Relative: 9 %
Neutro Abs: 10.7 10*3/uL — ABNORMAL HIGH (ref 1.7–7.7)
Neutrophils Relative %: 81 %
Platelets: 291 10*3/uL (ref 150–400)
RBC: 4.08 MIL/uL — ABNORMAL LOW (ref 4.22–5.81)
RDW: 13.2 % (ref 11.5–15.5)
WBC: 13.2 10*3/uL — ABNORMAL HIGH (ref 4.0–10.5)
nRBC: 0 % (ref 0.0–0.2)

## 2019-07-02 LAB — URINALYSIS, ROUTINE W REFLEX MICROSCOPIC
Bacteria, UA: NONE SEEN
Bilirubin Urine: NEGATIVE
Glucose, UA: >=500 mg/dL — AB
Ketones, ur: NEGATIVE mg/dL
Nitrite: NEGATIVE
Protein, ur: NEGATIVE mg/dL
Specific Gravity, Urine: 1.008 (ref 1.005–1.030)
Squamous Epithelial / HPF: NONE SEEN (ref 0–5)
pH: 5 (ref 5.0–8.0)

## 2019-07-02 LAB — COMPREHENSIVE METABOLIC PANEL
ALT: 28 U/L (ref 0–44)
AST: 18 U/L (ref 15–41)
Albumin: 3.1 g/dL — ABNORMAL LOW (ref 3.5–5.0)
Alkaline Phosphatase: 100 U/L (ref 38–126)
Anion gap: 11 (ref 5–15)
BUN: 76 mg/dL — ABNORMAL HIGH (ref 8–23)
CO2: 18 mmol/L — ABNORMAL LOW (ref 22–32)
Calcium: 8.8 mg/dL — ABNORMAL LOW (ref 8.9–10.3)
Chloride: 110 mmol/L (ref 98–111)
Creatinine, Ser: 3.98 mg/dL — ABNORMAL HIGH (ref 0.61–1.24)
GFR calc Af Amer: 17 mL/min — ABNORMAL LOW (ref >60)
GFR calc non Af Amer: 14 mL/min — ABNORMAL LOW (ref >60)
Glucose, Bld: 297 mg/dL — ABNORMAL HIGH (ref 70–99)
Potassium: 4.8 mmol/L (ref 3.5–5.1)
Sodium: 139 mmol/L (ref 135–145)
Total Bilirubin: 0.9 mg/dL (ref 0.3–1.2)
Total Protein: 7.1 g/dL (ref 6.5–8.1)

## 2019-07-02 LAB — GLUCOSE, CAPILLARY
Glucose-Capillary: 276 mg/dL — ABNORMAL HIGH (ref 70–99)
Glucose-Capillary: 276 mg/dL — ABNORMAL HIGH (ref 70–99)

## 2019-07-02 MED ORDER — ROSUVASTATIN CALCIUM 10 MG PO TABS: 10.0000 mg | ORAL_TABLET | ORAL | Status: DC

## 2019-07-02 MED ORDER — SODIUM CHLORIDE 0.9 % IV BOLUS
1000.0000 mL | Freq: Once | INTRAVENOUS | Status: AC
  Administered 2019-07-02: 1000 mL via INTRAVENOUS

## 2019-07-02 MED ORDER — PANTOPRAZOLE SODIUM 40 MG PO TBEC
40.0000 mg | DELAYED_RELEASE_TABLET | Freq: Every day | ORAL | Status: DC
  Administered 2019-07-02 – 2019-07-04 (×3): 40 mg via ORAL
  Filled 2019-07-02 (×3): qty 1

## 2019-07-02 MED ORDER — AMLODIPINE BESYLATE 5 MG PO TABS
5.0000 mg | ORAL_TABLET | Freq: Every day | ORAL | Status: DC
  Administered 2019-07-02 – 2019-07-04 (×3): 5 mg via ORAL
  Filled 2019-07-02 (×3): qty 1

## 2019-07-02 MED ORDER — ASPIRIN 81 MG PO CHEW
81.0000 mg | CHEWABLE_TABLET | Freq: Every day | ORAL | Status: DC
  Administered 2019-07-02 – 2019-07-03 (×2): 81 mg via ORAL
  Filled 2019-07-02 (×2): qty 1

## 2019-07-02 MED ORDER — TRAMADOL HCL 50 MG PO TABS: 50.0000 mg | ORAL_TABLET | Freq: Two times a day (BID) | ORAL | Status: DC | PRN

## 2019-07-02 MED ORDER — HEPARIN SODIUM (PORCINE) 5000 UNIT/ML IJ SOLN
5000.0000 [IU] | Freq: Three times a day (TID) | INTRAMUSCULAR | Status: DC
  Administered 2019-07-02 – 2019-07-04 (×5): 5000 [IU] via SUBCUTANEOUS
  Filled 2019-07-02 (×5): qty 1

## 2019-07-02 MED ORDER — ENOXAPARIN SODIUM 30 MG/0.3ML ~~LOC~~ SOLN
30.0000 mg | SUBCUTANEOUS | Status: DC
  Filled 2019-07-02: qty 0.3

## 2019-07-02 MED ORDER — INSULIN ASPART 100 UNIT/ML ~~LOC~~ SOLN
0.0000 [IU] | Freq: Three times a day (TID) | SUBCUTANEOUS | Status: DC
  Administered 2019-07-03: 11 [IU] via SUBCUTANEOUS
  Administered 2019-07-03: 3 [IU] via SUBCUTANEOUS
  Administered 2019-07-04 (×2): 5 [IU] via SUBCUTANEOUS
  Filled 2019-07-02 (×4): qty 1

## 2019-07-02 MED ORDER — LABETALOL HCL 5 MG/ML IV SOLN: 5.0000 mg | INTRAVENOUS | Status: DC | PRN

## 2019-07-02 NOTE — Progress Notes (Signed)
Report called to Heide Scales, RN for patient being transferred to room 208. Patient's blood pressure rechecked and recorded as requested. Patient states that he took pm medications at 1800 but unable to tell this nurse what medications he self administered. Instructed pt. not to self administer medications while in the hospital due to the risk of medication administration interference with prescribed medications. Voices understanding.

## 2019-07-02 NOTE — ED Notes (Signed)
Pt able to use the bathroom without difficulty. Post void residual 54cc's at this time. UA collected by this RN and sent to lab.

## 2019-07-02 NOTE — ED Triage Notes (Signed)
Pt presents to ED via POV with c/o weakness, states was sent by PCP for hyperglycemia and "I was on the verge of my kidneys shutting down". Pt c/o generalized weakness at this time.

## 2019-07-02 NOTE — ED Provider Notes (Signed)
[facility] Emergency Department Provider Note  ____________________________________________   First MD Initiated Contact with Patient 07/02/19 1725     (approximate)  I have reviewed the triage vital signs and the nursing notes.   HISTORY  Chief Complaint Weakness and Abnormal Lab    HPI Austin Goodwin is a 68 y.o. male with diabetes and hypertension who comes in for weakness.  Patient states he has been not feeling well for the past 2 weeks.  He initially had some hematuria and was given a shot and discharged on some antibiotics.  He states that he had a normal kidney function originally but I cannot see any checks within the last few years.  He states that he had his BMP done by his primary care doctor who noticed that it was significantly elevated.  His concern is from his diabetes due to his hyperglycemia.  He is not been checking his sugars at home but has been compliant with his Metformin and his glipizide.  Patient states that now he just feels a fogginess and weakness that is moderate, constant, nothing makes it better, nothing makes it worse.  He has had decreased p.o. intake due to the above.  He feels like he is getting his urine out.   Patient was seen on 12/4 at [facility].  Patient was found to have new acute renal failure.  Patient was admitted but he left AGAINST MEDICAL ADVICE.          Past Medical History:  Diagnosis Date  . Diabetes mellitus without complication ([place])   . Hypertension     Patient Active Problem List   Diagnosis Date Noted  . AKI (acute kidney injury) ([place]) 06/30/2019  . Acute renal failure [facility])     Past Surgical History:  Procedure Laterality Date  . CATARACT EXTRACTION, BILATERAL      Prior to Admission medications   Medication Sig Start Date End Date Taking? Authorizing Provider  amLODipine (NORVASC) 5 MG tablet Take 5 mg by mouth daily. 06/12/19   [provider]  aspirin 81 MG chewable  tablet Chew 81 mg by mouth at bedtime.    [provider]  doxycycline (VIBRA-TABS) 100 MG tablet Take 100 mg by mouth 2 (two) times daily. Will end 07/02/2019 06/25/19   [provider]  glipiZIDE (GLUCOTROL) 10 MG tablet Take 10 mg by mouth 2 (two) times daily before a meal.    [provider]  lisinopril (PRINIVIL,ZESTRIL) 10 MG tablet Take 10 mg by mouth 3 (three) times daily.    [provider]  metFORMIN (GLUCOPHAGE) 500 MG tablet Take 500 mg by mouth 2 (two) times daily with a meal.    [provider]  omeprazole (PRILOSEC) 20 MG capsule Take 20 mg by mouth daily as needed (heart burn).  06/12/19   [provider]  rosuvastatin (CRESTOR) 10 MG tablet Take 10 mg by mouth once a week. Wednesday    [provider]  traMADol (ULTRAM) 50 MG tablet Take 50 mg by mouth 2 (two) times daily as needed for pain. 06/18/19   [provider]    Allergies Levofloxacin and Other  Family History  Problem Relation Age of Onset  . Mitral valve prolapse Mother   . Heart disease Father     Social History Social History   Tobacco Use  . Smoking status: Never Smoker  . Smokeless tobacco: Never Used  Substance Use Topics  . Alcohol use: Yes  .  Drug use: No      Review of Systems Constitutional: No fever/chills, positive fogginess and weakness Eyes: No visual changes. ENT: No sore throat. Cardiovascular: Denies chest pain. Respiratory: Denies shortness of breath. Gastrointestinal: No abdominal pain.  No nausea, no vomiting.  No diarrhea.  No constipation. Genitourinary: Negative for dysuria. Musculoskeletal: Negative for back pain. Skin: Negative for rash. Neurological: Negative for headaches, focal weakness or numbness. All other ROS negative ____________________________________________   PHYSICAL EXAM:  VITAL SIGNS: ED Triage Vitals  Enc Vitals Group     BP 07/02/19 1307 (!) 167/89     Pulse Rate 07/02/19 1307  89     Resp 07/02/19 1307 18     Temp 07/02/19 1307 98.7 F (37.1 C)     Temp Source 07/02/19 1307 Oral     SpO2 07/02/19 1307 98 %     Weight 07/02/19 1305 205 lb (93 kg)     Height 07/02/19 1305 6' (1.829 m)     Head Circumference --      Peak Flow --      Pain Score 07/02/19 1305 0     Pain Loc --      Pain Edu? --      Excl. in [place]? --     Constitutional: Alert and oriented. Well appearing and in no acute distress. Eyes: Conjunctivae are normal. EOMI. Head: Atraumatic. Nose: No congestion/rhinnorhea. Mouth/Throat: Mucous membranes are moist.   Neck: No stridor. Trachea Midline. FROM Cardiovascular: Normal rate, regular rhythm. Grossly normal heart sounds.  Good peripheral circulation. Respiratory: Normal respiratory effort.  No retractions. Lungs CTAB. Gastrointestinal: Soft and nontender. No distention. No abdominal bruits.  Musculoskeletal: No lower extremity tenderness nor edema.  No joint effusions. Neurologic:  Normal speech and language. No gross focal neurologic deficits are appreciated.  Skin:  Skin is warm, dry and intact. No rash noted. Psychiatric: Mood and affect are normal. Speech and behavior are normal. GU: No CVA tenderness  ____________________________________________   LABS (all labs ordered are listed, but only abnormal results are displayed)  Labs Reviewed  CBC WITH DIFFERENTIAL/PLATELET - Abnormal; Notable for the following components:      Result Value   WBC 13.2 (*)    RBC 4.08 (*)    MCV 103.4 (*)    MCH 34.8 (*)    Neutro Abs 10.7 (*)    Monocytes Absolute 1.2 (*)    Abs Immature Granulocytes 0.15 (*)    All other components within normal limits  COMPREHENSIVE METABOLIC PANEL - Abnormal; Notable for the following components:   CO2 18 (*)    Glucose, Bld 297 (*)    BUN 76 (*)    Creatinine, Ser 3.98 (*)    Calcium 8.8 (*)    Albumin 3.1 (*)    GFR calc non Af Amer 14 (*)    GFR calc Af Amer 17 (*)    All other components within normal  limits  GLUCOSE, CAPILLARY - Abnormal; Notable for the following components:   Glucose-Capillary 276 (*)    All other components within normal limits   ____________________________________________   ED ECG REPORT I, Vanessa Salem, the attending physician, personally viewed and interpreted this ECG.  EKG is normal sinus rate of 87, no ST elevation, T wave inversion in lead III, normal intervals ____________________________________________ INITIAL IMPRESSION / ASSESSMENT AND PLAN / ED COURSE  Austin Goodwin was evaluated in Emergency Department on 07/02/2019 for the symptoms described in the history of present illness.  He was evaluated in the context of the global COVID-19 pandemic, which necessitated consideration that the patient might be at risk for infection with the SARS-CoV-2 virus that causes COVID-19. Institutional protocols and algorithms that pertain to the evaluation of patients at risk for COVID-19 are in a state of rapid change based on information released by regulatory bodies including the CDC and federal and state organizations. These policies and algorithms were followed during the patient's care in the ED.    Patient is a 68 year old gentleman who presents with generalized weakness in the setting of new acute renal failure.  I suspect that his symptoms of fogginess weakness is secondary to his BUN.  He denies any infectious symptoms to suggest pneumonia, coronavirus, UTI.  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  No abdominal pain or back pain to suggest need a CT scan at this time.  Possibly secondary to his hypertension versus his diabetes  Kidney function is elevated at 3.98 with a BUN of 76.  White count is elevated but no other infectious symptoms  Post void bladder scan was 50 cc.  UA not convincing for UTI but will send for culture.   Discussed the hospital team for admission.      ____________________________________________   FINAL CLINICAL IMPRESSION(S) / ED DIAGNOSES   Final diagnoses:  AKI (acute kidney injury) ([place])  Dehydration  Generalized weakness      MEDICATIONS GIVEN DURING THIS VISIT:  Medications  sodium chloride 0.9 % bolus 1,000 mL (has no administration in time range)     ED Discharge Orders    None       Note:  This document was prepared using Dragon voice recognition software and may include unintentional dictation errors.   Vanessa Richwood, MD 07/02/19 [phone]

## 2019-07-02 NOTE — H&P (Addendum)
History and Physical    Austin Goodwin ZOX:[id number] DOB: 1950/10/05 DOA: 07/02/2019  PCP: Reynold Bowen, MD  Patient coming from: Home  I have personally briefly reviewed patient's old medical records in [place]  Chief Complaint: Weakness, kidney injury HPI: Austin Goodwin is a 68 y.o. male with medical history significant of TIA, type 2 diabetes, left carotid artery stenosis, GERD who presents with concerns of progressive weakness and decreased p.o. intake.  Patient reports that about a week ago he had hematuria and saw his primary physician and was prescribed antibiotics which resolved his hematuria.  However he then began to note progressive weakness and presented to the ED yesterday . He was admitted for AKI and hyperglycemia at [facility] but patient left AGAINST MEDICAL ADVICE before he could get a room up on the floor.  He is back today because of continuous weakness, decreased p.o. intake and overall feeling "foggy." He denies any more hematuria.  Denies any urinary symptoms. Able to void without issues. He does not remember ever having a diagnosis of kidney disease.  Denies any fevers or chills.  No nausea, vomiting or diarrhea.  Former tobacco user but quit 40 years ago.  Endorse occasional alcohol use.  Denies illicit drug use.  Family history includes mother and father who both had cardiac disease.  ED Course: He was afebrile and hypertensive up to 170s over 80s on room air.  CBC shows leukocytosis of 13.1 and normal hemoglobin of 14.2.  MP showed glucose of 297, BUN of 76, creatinine of 3.98 down from 4.56 about 2 days ago, GFR of 14.  Urinalysis showed significant glucose greater than 500, negative ketones, small leukocyte, negative nitrite, no RBCs.  Review of Systems:  Constitutional: No Weight Change, No Fever ENT/Mouth: No sore throat, No Rhinorrhea Eyes: No Eye Pain, No Vision Changes Cardiovascular: No Chest Pain, no SOB Respiratory: No Cough, No Sputum,   Gastrointestinal: No Nausea, No Vomiting, No Diarrhea, No Constipation, No Pain Genitourinary: no Urinary Incontinence, No Urgency, No Flank Pain Musculoskeletal: No Arthralgias, No Myalgias Skin: No Skin Lesions, No Pruritus, Neuro:+ Weakness, No Numbness,   Psych: No Anxiety/Panic, No Depression, no decrease appetite Heme/Lymph: No Bruising, No Bleeding  Past Medical History:  Diagnosis Date  . Diabetes mellitus without complication ([place])   . Hypertension     Past Surgical History:  Procedure Laterality Date  . CATARACT EXTRACTION, BILATERAL       reports that he has never smoked. He has never used smokeless tobacco. He reports current alcohol use. He reports that he does not use drugs.  Allergies  Allergen Reactions  . Levofloxacin Hives  . Other     Family History  Problem Relation Age of Onset  . Mitral valve prolapse Mother   . Heart disease Father      Prior to Admission medications   Medication Sig Start Date End Date Taking? Authorizing Provider  amLODipine (NORVASC) 5 MG tablet Take 5 mg by mouth daily. 06/12/19  Yes [provider]  aspirin 81 MG chewable tablet Chew 81 mg by mouth at bedtime.   Yes [provider]  glipiZIDE (GLUCOTROL) 10 MG tablet Take 10 mg by mouth 2 (two) times daily before a meal.   Yes [provider]  lisinopril (PRINIVIL,ZESTRIL) 10 MG tablet Take 10 mg by mouth 3 (three) times daily.   Yes [provider]  metFORMIN (GLUCOPHAGE) 500 MG tablet Take 500 mg by mouth 2 (two) times daily with  a meal.   Yes [provider]  omeprazole (PRILOSEC) 20 MG capsule Take 20 mg by mouth daily as needed (heart burn).  06/12/19  Yes [provider]  rosuvastatin (CRESTOR) 10 MG tablet Take 10 mg by mouth once a week. Wednesday   Yes [provider]  traMADol (ULTRAM) 50 MG tablet Take 50 mg by mouth 2 (two) times daily as needed for pain. 06/18/19  Yes [provider]     Physical Exam: Vitals:   07/02/19 1305 07/02/19 1307 07/02/19 1652 07/02/19 1944  BP:  (!) 167/89 (!) 169/86 (!) 181/84  Pulse:  89 79 77  Resp:  18 17 16   Temp:  98.7 F (37.1 C) 97.7 F (36.5 C) 98.3 F (36.8 C)  TempSrc:  Oral Oral Oral  SpO2:  98% 97% 100%  Weight: 93 kg     Height: 6' (1.829 m)       Constitutional: NAD, calm, comfortable, well appearing male sitting upright in bed Vitals:   07/02/19 1305 07/02/19 1307 07/02/19 1652 07/02/19 1944  BP:  (!) 167/89 (!) 169/86 (!) 181/84  Pulse:  89 79 77  Resp:  18 17 16   Temp:  98.7 F (37.1 C) 97.7 F (36.5 C) 98.3 F (36.8 C)  TempSrc:  Oral Oral Oral  SpO2:  98% 97% 100%  Weight: 93 kg     Height: 6' (1.829 m)      Eyes: PERRL, lids and conjunctivae normal ENMT: Mucous membranes are moist. Neck: normal, supple. Respiratory: clear to auscultation bilaterally, no wheezing, no crackles. Normal respiratory effort.   Cardiovascular: Regular rate and rhythm, no murmurs / rubs / gallops. No extremity edema.   Abdomen: no tenderness, no masses palpated. Bowel sounds positive. No flank pain. Musculoskeletal: no clubbing / cyanosis. No joint deformity upper and lower extremities. Good ROM, no contractures. Normal muscle tone.  Skin: no rashes, lesions, ulcers. No induration Neurologic: CN 2-12 grossly intact. DTR normal. Strength 4/5 in lower extremity.  Psychiatric: Normal judgment and insight. Alert and oriented x 3. Normal mood.     Labs on Admission: I have personally reviewed following labs and imaging studies  CBC: Recent Labs  Lab 06/29/19 1947 06/30/19 0128 07/02/19 1317  WBC 15.3* 12.9* 13.2*  NEUTROABS  --   --  10.7*  HGB 13.7 11.9* 14.2  HCT 40.0 35.3* 42.2  MCV 102.8* 103.2* 103.4*  PLT 182 157 161   Basic Metabolic Panel: Recent Labs  Lab 06/29/19 2002 06/30/19 0128 06/30/19 0500 07/02/19 1317  NA 136  --  138 139  K 5.0  --  4.0 4.8  CL 102  --  105 110  CO2 18*  --  19* 18*  GLUCOSE  334*  --  290* 297*  BUN 97*  --  97* 76*  CREATININE 4.90* 5.06* 4.56* 3.98*  CALCIUM 9.1  --  8.3* 8.8*   GFR: Estimated Creatinine Clearance: 19.2 mL/min (A) (by C-G formula based on SCr of 3.98 mg/dL (H)). Liver Function Tests: Recent Labs  Lab 06/29/19 2002 07/02/19 1317  AST 29 18  ALT 26 28  ALKPHOS 112 100  BILITOT 1.4* 0.9  PROT 7.0 7.1  ALBUMIN 2.9* 3.1*   No results for input(s): LIPASE, AMYLASE in the last 168 hours. No results for input(s): AMMONIA in the last 168 hours. Coagulation Profile: No results for input(s): INR, PROTIME in the last 168 hours. Cardiac Enzymes: No results for input(s): CKTOTAL, CKMB, CKMBINDEX, TROPONINI in the last  168 hours. BNP (last 3 results) No results for input(s): PROBNP in the last 8760 hours. HbA1C: Recent Labs    06/30/19 0128  HGBA1C 7.9*   CBG: Recent Labs  Lab 06/29/19 1946 06/29/19 2006 06/29/19 2331 06/30/19 0505 07/02/19 1316  GLUCAP 331* 297* 247* 255* 276*   Lipid Profile: No results for input(s): CHOL, HDL, LDLCALC, TRIG, CHOLHDL, LDLDIRECT in the last 72 hours. Thyroid Function Tests: No results for input(s): TSH, T4TOTAL, FREET4, T3FREE, THYROIDAB in the last 72 hours. Anemia Panel: No results for input(s): VITAMINB12, FOLATE, FERRITIN, TIBC, IRON, RETICCTPCT in the last 72 hours. Urine analysis:    Component Value Date/Time   COLORURINE YELLOW (A) 07/02/2019 1746   APPEARANCEUR HAZY (A) 07/02/2019 1746   LABSPEC 1.008 07/02/2019 1746   PHURINE 5.0 07/02/2019 1746   GLUCOSEU >=500 (A) 07/02/2019 1746   HGBUR MODERATE (A) 07/02/2019 1746   BILIRUBINUR NEGATIVE 07/02/2019 1746   KETONESUR NEGATIVE 07/02/2019 1746   PROTEINUR NEGATIVE 07/02/2019 1746   NITRITE NEGATIVE 07/02/2019 1746   LEUKOCYTESUR SMALL (A) 07/02/2019 1746    Radiological Exams on Admission: No results found.  EKG: Independently reviewed.   Assessment/Plan  AKI -Unsure if patient has had progressive CKD.  Last blood work  in the system was in 2014 with normal function. - creatinine of 3.98 , GFR of 14 -Patient with minimal improvement in his creatinine from fluid resuscitation ED evaluation yesterday. - Obtain FENA lab - renal ultrasound  - consider nephrology consult   Weakness - possible related to renal insufficiency and hyperglycemia - PT eval  Type 2 diabetes - HbA1C of 7.9 two days ago - Moderate SSI   Hypertension  -Continue amlodipine -Hold lisinopril due to AKI - PRN labetalol for BP with systolic greater than 915/[id number] greater than 165  GERD -Continue omeprazole  History of TIA -Continue aspirin  Hyperlipidemia -Continue statin   DVT prophylaxis: Heparin   Code Status: Full Family Communication: Plan discussed with patient at bedside  disposition Plan: Home with at least 2 midnight stays  Consults called:  Admission status: inpatient   Nolan Lasser T Harjot Dibello DO [facility]   If 7PM-7AM, please contact night-coverage [email or web address] Password [email or web address]  07/02/2019, 9:35 PM

## 2019-07-03 ENCOUNTER — Inpatient Hospital Stay: Payer: Medicare Other

## 2019-07-03 ENCOUNTER — Observation Stay: Payer: Medicare Other

## 2019-07-03 ENCOUNTER — Encounter: Payer: Self-pay | Admitting: Internal Medicine

## 2019-07-03 DIAGNOSIS — I129 Hypertensive chronic kidney disease with stage 1 through stage 4 chronic kidney disease, or unspecified chronic kidney disease: Secondary | ICD-10-CM | POA: Diagnosis present

## 2019-07-03 DIAGNOSIS — Z7982 Long term (current) use of aspirin: Secondary | ICD-10-CM | POA: Diagnosis not present

## 2019-07-03 DIAGNOSIS — E1165 Type 2 diabetes mellitus with hyperglycemia: Secondary | ICD-10-CM | POA: Diagnosis present

## 2019-07-03 DIAGNOSIS — N189 Chronic kidney disease, unspecified: Secondary | ICD-10-CM | POA: Diagnosis present

## 2019-07-03 DIAGNOSIS — I69351 Hemiplegia and hemiparesis following cerebral infarction affecting right dominant side: Secondary | ICD-10-CM | POA: Diagnosis not present

## 2019-07-03 DIAGNOSIS — N179 Acute kidney failure, unspecified: Secondary | ICD-10-CM | POA: Diagnosis not present

## 2019-07-03 DIAGNOSIS — Z7984 Long term (current) use of oral hypoglycemic drugs: Secondary | ICD-10-CM | POA: Diagnosis not present

## 2019-07-03 DIAGNOSIS — N281 Cyst of kidney, acquired: Secondary | ICD-10-CM | POA: Diagnosis present

## 2019-07-03 DIAGNOSIS — I10 Essential (primary) hypertension: Secondary | ICD-10-CM

## 2019-07-03 DIAGNOSIS — R319 Hematuria, unspecified: Secondary | ICD-10-CM | POA: Diagnosis present

## 2019-07-03 DIAGNOSIS — R310 Gross hematuria: Secondary | ICD-10-CM | POA: Diagnosis present

## 2019-07-03 DIAGNOSIS — E860 Dehydration: Secondary | ICD-10-CM | POA: Diagnosis present

## 2019-07-03 DIAGNOSIS — Z20828 Contact with and (suspected) exposure to other viral communicable diseases: Secondary | ICD-10-CM | POA: Diagnosis present

## 2019-07-03 DIAGNOSIS — Z87891 Personal history of nicotine dependence: Secondary | ICD-10-CM | POA: Diagnosis not present

## 2019-07-03 DIAGNOSIS — Z79899 Other long term (current) drug therapy: Secondary | ICD-10-CM | POA: Diagnosis not present

## 2019-07-03 DIAGNOSIS — D472 Monoclonal gammopathy: Secondary | ICD-10-CM | POA: Diagnosis present

## 2019-07-03 DIAGNOSIS — K219 Gastro-esophageal reflux disease without esophagitis: Secondary | ICD-10-CM | POA: Diagnosis present

## 2019-07-03 DIAGNOSIS — E785 Hyperlipidemia, unspecified: Secondary | ICD-10-CM | POA: Diagnosis present

## 2019-07-03 DIAGNOSIS — E1122 Type 2 diabetes mellitus with diabetic chronic kidney disease: Secondary | ICD-10-CM | POA: Diagnosis present

## 2019-07-03 DIAGNOSIS — E875 Hyperkalemia: Secondary | ICD-10-CM | POA: Diagnosis present

## 2019-07-03 DIAGNOSIS — Z8249 Family history of ischemic heart disease and other diseases of the circulatory system: Secondary | ICD-10-CM | POA: Diagnosis not present

## 2019-07-03 LAB — BASIC METABOLIC PANEL
Anion gap: 10 (ref 5–15)
BUN: 70 mg/dL — ABNORMAL HIGH (ref 8–23)
CO2: 18 mmol/L — ABNORMAL LOW (ref 22–32)
Calcium: 8.5 mg/dL — ABNORMAL LOW (ref 8.9–10.3)
Chloride: 112 mmol/L — ABNORMAL HIGH (ref 98–111)
Creatinine, Ser: 3.71 mg/dL — ABNORMAL HIGH (ref 0.61–1.24)
GFR calc Af Amer: 18 mL/min — ABNORMAL LOW (ref >60)
GFR calc non Af Amer: 16 mL/min — ABNORMAL LOW (ref >60)
Glucose, Bld: 301 mg/dL — ABNORMAL HIGH (ref 70–99)
Potassium: 5.4 mmol/L — ABNORMAL HIGH (ref 3.5–5.1)
Sodium: 140 mmol/L (ref 135–145)

## 2019-07-03 LAB — GLUCOSE, CAPILLARY
Glucose-Capillary: 335 mg/dL — ABNORMAL HIGH (ref 70–99)
Glucose-Capillary: 302 mg/dL — ABNORMAL HIGH (ref 70–99)
Glucose-Capillary: 184 mg/dL — ABNORMAL HIGH (ref 70–99)
Glucose-Capillary: 97 mg/dL (ref 70–99)
Glucose-Capillary: 148 mg/dL — ABNORMAL HIGH (ref 70–99)

## 2019-07-03 LAB — CBC
HCT: 40.1 % (ref 39.0–52.0)
Hemoglobin: 13.4 g/dL (ref 13.0–17.0)
MCH: 34.8 pg — ABNORMAL HIGH (ref 26.0–34.0)
MCHC: 33.4 g/dL (ref 30.0–36.0)
MCV: 104.2 fL — ABNORMAL HIGH (ref 80.0–100.0)
Platelets: 254 10*3/uL (ref 150–400)
RBC: 3.85 MIL/uL — ABNORMAL LOW (ref 4.22–5.81)
RDW: 13.2 % (ref 11.5–15.5)
WBC: 11.8 10*3/uL — ABNORMAL HIGH (ref 4.0–10.5)
nRBC: 0 % (ref 0.0–0.2)

## 2019-07-03 LAB — PROTEIN / CREATININE RATIO, URINE
Creatinine, Urine: 52 mg/dL
Protein Creatinine Ratio: 0.38 mg/mg{Cre} — ABNORMAL HIGH (ref 0.00–0.15)
Total Protein, Urine: 20 mg/dL

## 2019-07-03 LAB — SARS CORONAVIRUS 2 (TAT 6-24 HRS): SARS Coronavirus 2: NEGATIVE

## 2019-07-03 LAB — HEPATITIS B SURFACE ANTIBODY,QUALITATIVE: Hep B S Ab: NONREACTIVE

## 2019-07-03 LAB — HEPATITIS B SURFACE ANTIGEN: Hepatitis B Surface Ag: NONREACTIVE

## 2019-07-03 LAB — SODIUM, URINE, RANDOM: Sodium, Ur: 41 mmol/L

## 2019-07-03 LAB — CREATININE, URINE, RANDOM: Creatinine, Urine: 59 mg/dL

## 2019-07-03 MED ORDER — INSULIN GLARGINE 100 UNIT/ML ~~LOC~~ SOLN
5.0000 [IU] | Freq: Every day | SUBCUTANEOUS | Status: DC
  Administered 2019-07-03 – 2019-07-04 (×2): 5 [IU] via SUBCUTANEOUS
  Filled 2019-07-03 (×3): qty 0.05

## 2019-07-03 MED ORDER — SODIUM POLYSTYRENE SULFONATE 15 GM/60ML PO SUSP
30.0000 g | Freq: Once | ORAL | Status: AC
  Administered 2019-07-03: 30 g via ORAL
  Filled 2019-07-03: qty 120

## 2019-07-03 MED ORDER — LACTATED RINGERS IV SOLN
INTRAVENOUS | Status: DC
  Administered 2019-07-03 – 2019-07-04 (×4): via INTRAVENOUS

## 2019-07-03 MED ORDER — LORAZEPAM 0.5 MG PO TABS
0.5000 mg | ORAL_TABLET | Freq: Once | ORAL | Status: AC
  Administered 2019-07-03: 0.5 mg via ORAL
  Filled 2019-07-03: qty 1

## 2019-07-03 NOTE — Evaluation (Signed)
Physical Therapy Evaluation Patient Details Name: Austin Goodwin MRN: [id number] DOB: [birth year] Today's Date: 07/03/2019   History of Present Illness   68 y.o. male with medical history significant of TIA, type 2 diabetes, left carotid artery stenosis, GERD who presents with concerns of progressive weakness and some AMS.  Feeling much better at time of PT exam.  Clinical Impression  Pt did well with all mobility and ambulation and overall showed good confidence and safety despite significant R knee OA/limp.  Pt with very good strength t/o and feels that he is essentially back to his baseline. 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  Pt is safe to go home, no further needs.    Follow Up Recommendations No PT follow up    Equipment Recommendations  None recommended by PT    Recommendations for Other Services       Precautions / Restrictions Precautions Precautions: None Restrictions Weight Bearing Restrictions: No      Mobility  Bed Mobility Overal bed mobility: Independent                Transfers Overall transfer level: Independent Equipment used: None             General transfer comment: Able to rise w/o issue  Ambulation/Gait Ambulation/Gait assistance: Independent Gait Distance (Feet): 250 Feet Assistive device: None       General Gait Details: Pt with significant baseline limp (R knee OA, planning on TKA "Soon").  No LOBs or balance issues  Stairs Stairs: Yes Stairs assistance: Independent Stair Management: One rail Right Number of Stairs: 6 General stair comments: Pt using step-to strategy to control descent, reciprocal to ascend  Wheelchair Mobility    Modified Rankin (Stroke Patients Only)       Balance Overall balance assessment: Independent                                           Pertinent Vitals/Pain Pain Assessment: (chronic R  knee pain from OA)    Home Living Family/patient expects to be discharged to:: Private residence Living Arrangements: Spouse/significant other Available Help at Discharge: Available 24 hours/day Type of Home: House Home Access: Stairs to enter Entrance Stairs-Rails: Chemical engineer of Steps: 4 Home Layout: One level        Prior Function Level of Independence: Independent         Comments: 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, able to be active and independent     Hand Dominance        Extremity/Trunk Assessment   Upper Extremity Assessment Upper Extremity Assessment: Overall WFL for tasks assessed    Lower Extremity Assessment Lower Extremity Assessment: Overall WFL for tasks assessed       Communication   Communication: No difficulties  Cognition Arousal/Alertness: Awake/alert Behavior During Therapy: WFL for tasks assessed/performed Overall Cognitive Status: Within Functional Limits for tasks assessed                                        General Comments      Exercises     Assessment/Plan    PT Assessment Patent does not need any further PT services  PT Problem List  PT Treatment Interventions      PT Goals (Current goals can be found in the Care Plan section)  Acute Rehab PT Goals Patient Stated Goal: go home today PT Goal Formulation: All assessment and education complete, DC therapy    Frequency     Barriers to discharge        Co-evaluation               AM-PAC PT "6 Clicks" Mobility  Outcome Measure Help needed turning from your back to your side while in a flat bed without using bedrails?: None Help needed moving from lying on your back to sitting on the side of a flat bed without using bedrails?: None Help needed moving to and from a bed to a chair (including a wheelchair)?: None Help needed standing up from a chair using your arms (e.g., wheelchair or bedside chair)?: None Help needed  to walk in hospital room?: None Help needed climbing 3-5 steps with a railing? : None 6 Click Score: 24    End of Session Equipment Utilized During Treatment: Gait belt Activity Tolerance: Patient tolerated treatment well Patient left: in bed;with call bell/phone within reach Nurse Communication: Mobility status PT Visit Diagnosis: Muscle weakness (generalized) (M62.81)    Time: 0940-7680 PT Time Calculation (min) (ACUTE ONLY): 17 min   Charges:   PT Evaluation $PT Eval Low Complexity: 1 Low          Kreg Shropshire, DPT 07/03/2019, 12:03 PM

## 2019-07-03 NOTE — Consult Note (Signed)
[address] [place], [place] [address] Phone [phone]. Fax [phone]  Date: 07/03/2019                  Patient Name:  Austin Goodwin  MRN: [id number]  DOB: 04-30-1951  Age / Sex: 68 y.o., male         PCP: Reynold Bowen, MD                 Service Requesting Consult: IM/ Jennye Boroughs, MD                 Reason for Consult: ARF            History of Present Illness: Patient is a 68 y.o. male with medical problems of DM-2, TIA, Left carotid stenosis, GERD, who was admitted to [facility] on 07/02/2019 for evaluation of progressive weakness.  Patient reports that he had dysuria a few days ago and was prescribed antibiotics.  He has also noticed gross blood in the urine.  Over the next few days, urine became clear  Work-up so far shows urinalysis with greater than 500 glucose, negative for protein, 11-20 WBCs  Renal ultrasound shows 1.6 cm hypoechoic nodule at inferior pole of right kidney with question of solid tumor versus complex cystic lesion  No recent baseline creatinine is available.  At admission, creatinine was noted to be 4.90 which peaked at 5.06.  It has steadily improved to 3.71.  Albumin is low at 3.1.  Hemoglobin is normal at 13.4 and platelet count is normal.  Hemoglobin A1c of 7.9% on December 5  Medications: Outpatient medications: Medications Prior to Admission  Medication Sig Dispense Refill Last Dose  . amLODipine (NORVASC) 5 MG tablet Take 5 mg by mouth daily.   24+ hours at Unknown  . aspirin 81 MG chewable tablet Chew 81 mg by mouth at bedtime.   24+ hours at Unknown  . glipiZIDE (GLUCOTROL) 10 MG tablet Take 10 mg by mouth 2 (two) times daily before a meal.   24+ hours at Unknown  . lisinopril (PRINIVIL,ZESTRIL) 10 MG tablet Take 10 mg by mouth 3 (three) times daily.   24+ hours at Unknown  . metFORMIN (GLUCOPHAGE) 500 MG tablet Take 500 mg by mouth 2 (two) times daily with a meal.   24+ hours at Unknown  . omeprazole (PRILOSEC) 20 MG  capsule Take 20 mg by mouth daily as needed (heart burn).    Unknown at PRN  . rosuvastatin (CRESTOR) 10 MG tablet Take 10 mg by mouth once a week. Wednesday   As directed at As directed  . traMADol (ULTRAM) 50 MG tablet Take 50 mg by mouth 2 (two) times daily as needed for pain.   Unknown at PRN    Current medications: Current Facility-Administered Medications  Medication Dose Route Frequency Provider Last Rate Last Dose  . amLODipine (NORVASC) tablet 5 mg  5 mg Oral Daily Tu, Ching T, DO   5 mg at 07/02/19 2231  . aspirin chewable tablet 81 mg  81 mg Oral QHS Tu, Ching T, DO   81 mg at 07/02/19 2231  . heparin injection 5,000 Units  5,000 Units Subcutaneous Q8H Tu, Ching T, DO   5,000 Units at 07/03/19 0617  . insulin aspart (novoLOG) injection 0-15 Units  0-15 Units Subcutaneous TID WC Tu, Ching T, DO      . insulin glargine (LANTUS) injection 5 Units  5 Units Subcutaneous Daily Jennye Boroughs, MD      .  labetalol (NORMODYNE) injection 5 mg  5 mg Intravenous Q2H PRN Tu, Ching T, DO      . lactated ringers infusion   Intravenous Continuous Jennye Boroughs, MD      . pantoprazole (PROTONIX) EC tablet 40 mg  40 mg Oral Daily Tu, Ching T, DO   40 mg at 07/02/19 2231  . [START ON 07/04/2019] rosuvastatin (CRESTOR) tablet 10 mg  10 mg Oral Weekly Tu, Ching T, DO      . sodium polystyrene (KAYEXALATE) 15 GM/60ML suspension 30 g  30 g Oral Once Jennye Boroughs, MD      . traMADol Veatrice Bourbon) tablet 50 mg  50 mg Oral BID PRN Tu, Ching T, DO          Allergies: Allergies  Allergen Reactions  . Levofloxacin Hives  . Other       Past Medical History: Past Medical History:  Diagnosis Date  . Diabetes mellitus without complication ([place])   . Hypertension   History of stroke with right-sided weakness Right carotid artery stenosis, left internal carotid artery occlusion   Past Surgical History: Past Surgical History:  Procedure Laterality Date  . CATARACT EXTRACTION, BILATERAL       Family  History: Family History  Problem Relation Age of Onset  . Mitral valve prolapse Mother   . Heart disease Father      Social History: Social History   Socioeconomic History  . Marital status: Married    Spouse name: Not on file  . Number of children: Not on file  . Years of education: Not on file  . Highest education level: Not on file  Occupational History  . Not on file  Social Needs  . Financial resource strain: Not on file  . Food insecurity    Worry: Not on file    Inability: Not on file  . Transportation needs    Medical: Not on file    Non-medical: Not on file  Tobacco Use  . Smoking status: Never Smoker  . Smokeless tobacco: Never Used  Substance and Sexual Activity  . Alcohol use: Yes  . Drug use: No  . Sexual activity: Not on file  Lifestyle  . Physical activity    Days per week: Not on file    Minutes per session: Not on file  . Stress: Not on file  Relationships  . Social Herbalist on phone: Not on file    Gets together: Not on file    Attends religious service: Not on file    Active member of club or organization: Not on file    Attends meetings of clubs or organizations: Not on file    Relationship status: Not on file  . Intimate partner violence    Fear of current or ex partner: Not on file    Emotionally abused: Not on file    Physically abused: Not on file    Forced sexual activity: Not on file  Other Topics Concern  . Not on file  Social History Narrative  . Not on file     Review of Systems: Gen: No fevers or chills, no weight changes HEENT: No vision problems, denies hearing problems CV: No chest pain or shortness of breath.  No cardiac history Resp: No cough or shortness of breath.  No hemoptysis GI: Appetite has been poor lately.  Denies any diarrhea GU : Had dysuria prior to admission, hematuria.  Resolved now MS: Bilateral knee pain, history of nonsteroidal use  Derm:    No complaints Psych: No complaints Heme: No  complaints Neuro: No complaints Endocrine: No complaints  Vital Signs: Blood pressure (!) 168/83, pulse 73, temperature 98.6 F (37 C), temperature source Oral, resp. rate 20, height 6' (1.829 m), weight 93 kg, SpO2 97 %.   Intake/Output Summary (Last 24 hours) at 07/03/2019 1026 Last data filed at 07/03/2019 0900 Gross per 24 hour  Intake 840 ml  Output 400 ml  Net 440 ml    Weight trends: [employer]   07/02/19 1305  Weight: 93 kg    Physical Exam: General:  No acute distress, sitting up on the side of the bed  HEENT  anicteric, moist oral mucous membranes  Neck:  Supple, no masses  Lungs:  Normal breathing effort, clear to auscultation  Heart::  Regular, soft systolic murmur  Abdomen:  Soft, nontender  Extremities:  Left leg 1+ edema   Neurologic:  Alert, oriented, following commands  Skin:  No acute rashes    Lab results: Basic Metabolic Panel: Recent Labs  Lab 06/30/19 0500 07/02/19 1317 07/03/19 0728  NA 138 139 140  K 4.0 4.8 5.4*  CL 105 110 112*  CO2 19* 18* 18*  GLUCOSE 290* 297* 301*  BUN 97* 76* 70*  CREATININE 4.56* 3.98* 3.71*  CALCIUM 8.3* 8.8* 8.5*    Liver Function Tests: Recent Labs  Lab 07/02/19 1317  AST 18  ALT 28  ALKPHOS 100  BILITOT 0.9  PROT 7.1  ALBUMIN 3.1*   No results for input(s): LIPASE, AMYLASE in the last 168 hours. No results for input(s): AMMONIA in the last 168 hours.  CBC: Recent Labs  Lab 07/02/19 1317 07/03/19 0728  WBC 13.2* 11.8*  NEUTROABS 10.7*  --   HGB 14.2 13.4  HCT 42.2 40.1  MCV 103.4* 104.2*  PLT 291 254    Cardiac Enzymes: No results for input(s): CKTOTAL, TROPONINI in the last 168 hours.  BNP: Invalid input(s): POCBNP  CBG: Recent Labs  Lab 06/29/19 2006 06/29/19 2331 06/30/19 0505 07/02/19 1316 07/02/19 2340  GLUCAP 297* 247* 255* 276* 276*    Microbiology: Recent Results (from the past 720 hour(s))  SARS CORONAVIRUS 2 (TAT 6-24 HRS) Nasopharyngeal Nasopharyngeal Swab      Status: None   Collection Time: 06/29/19 10:17 PM   Specimen: Nasopharyngeal Swab  Result Value Ref Range Status   SARS Coronavirus 2 NEGATIVE NEGATIVE Final    Comment: (NOTE) SARS-CoV-2 target nucleic acids are NOT DETECTED. The SARS-CoV-2 RNA is generally detectable in upper and lower respiratory specimens during the acute phase of infection. Negative results do not preclude SARS-CoV-2 infection, do not rule out co-infections with other pathogens, and should not be used as the sole basis for treatment or other patient management decisions. Negative results must be combined with clinical observations, patient history, and epidemiological information. The expected result is Negative. Fact Sheet for Patients: SugarRoll.be Fact Sheet for Healthcare Providers: https://www.woods-mathews.com/ This test is not yet approved or cleared by the [place] FDA and  has been authorized for detection and/or diagnosis of SARS-CoV-2 by FDA under an Emergency Use Authorization (EUA). This EUA will remain  in effect (meaning this test can be used) for the duration of the COVID-19 declaration under Section 56 4(b)(1) of the Act, 21 U.S.C. section 360bbb-3(b)(1), unless the authorization is terminated or revoked sooner. Performed at [facility], [place] [address], [place], [place] [address]   SARS CORONAVIRUS 2 (TAT 6-24 HRS) Nasopharyngeal Nasopharyngeal Swab  Status: None   Collection Time: 07/02/19  6:26 PM   Specimen: Nasopharyngeal Swab  Result Value Ref Range Status   SARS Coronavirus 2 NEGATIVE NEGATIVE Final    Comment: (NOTE) SARS-CoV-2 target nucleic acids are NOT DETECTED. The SARS-CoV-2 RNA is generally detectable in upper and lower respiratory specimens during the acute phase of infection. Negative results do not preclude SARS-CoV-2 infection, do not rule out co-infections with other pathogens, and should not be used as the sole  basis for treatment or other patient management decisions. Negative results must be combined with clinical observations, patient history, and epidemiological information. The expected result is Negative. Fact Sheet for Patients: SugarRoll.be Fact Sheet for Healthcare Providers: https://www.woods-mathews.com/ This test is not yet approved or cleared by the [place] FDA and  has been authorized for detection and/or diagnosis of SARS-CoV-2 by FDA under an Emergency Use Authorization (EUA). This EUA will remain  in effect (meaning this test can be used) for the duration of the COVID-19 declaration under Section 56 4(b)(1) of the Act, 21 U.S.C. section 360bbb-3(b)(1), unless the authorization is terminated or revoked sooner. Performed at [facility], [place] [address], [place], [place] [address]      Coagulation Studies: No results for input(s): LABPROT, INR in the last 72 hours.  Urinalysis: Recent Labs    07/02/19 1746  COLORURINE YELLOW*  LABSPEC 1.008  PHURINE 5.0  GLUCOSEU >=500*  HGBUR MODERATE*  BILIRUBINUR NEGATIVE  KETONESUR NEGATIVE  PROTEINUR NEGATIVE  NITRITE NEGATIVE  LEUKOCYTESUR SMALL*        Imaging: US Renal  Result Date: 07/03/2019 CLINICAL DATA:  Acute kidney injury, diabetes mellitus, hypertension EXAM: RENAL / URINARY TRACT ULTRASOUND COMPLETE COMPARISON:  None FINDINGS: Right Kidney: Renal measurements: 14.0 x 6.3 x 5.7 cm = volume: 260 mL. Normal cortical thickness and echogenicity. Small hypoechoic nodule at lower pole 1.6 x 1.4 x 1.6 cm question complicated cyst versus solid lesion. No hydronephrosis, additional mass or shadowing calcification. Left Kidney: Renal measurements: 13.7 x 6.7 x 6.3 cm = volume: 303 mL. Normal cortical thickness and echogenicity. No mass, hydronephrosis, or shadowing calcification. Bladder: Appears normal for degree of bladder distention. Other: N/A IMPRESSION: 1.6 cm  diameter hypoechoic nodule at inferior pole RIGHT kidney question solid mass/tumor versus complex cystic lesion; characterization by MR imaging recommended. Electronically Signed   By: Lavonia Dana M.D.   On: 07/03/2019 08:41      Assessment & Plan: Pt is a 68 y.o. Caucasian male with diabetes, was admitted on 07/02/2019 with generalized weakness, gross hematuria and dysuria  1.  Acute kidney injury, baseline creatinine unknown 2.  Hematuria and dysuria 3.  Chronic kidney disease unspecified 4.  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  Hypoalbuminemia   Creatinine peaked at 5.06, improved to 3.71 now Work-up so far shows urinalysis with hematuria, glucosuria, negative for protein Renal ultrasound shows 1.6 cm inferior pole right kidney solid tumor versus complex cystic lesion Patient likely has underlying chronic kidney disease due to diabetes.  Baseline creatinine is unknown at this time.  Last known creatinine was 1.10 in February 2014 Cause for gross hematuria and dysuria is unclear.  Patient will need urology evaluation and cystoscopy as outpatient.  Plan: We will obtain screening serologies Repeat urinalysis and urine protein creatinine ratio MRI of the right kidney for evaluation of 1.6 cm lesion Discussed with patient to minimize nonsteroidals Patient is a non-smoker      LOS: 0 Maysie Parkhill 12/8/202010:26 AM  Note: This note was prepared with Dragon dictation. Any transcription errors are unintentional

## 2019-07-03 NOTE — Progress Notes (Signed)
Progress Note    Austin Goodwin  JHE:[id number] DOB: [birth year]  DOA: 07/02/2019 PCP: Reynold Bowen, MD      Brief Narrative:    Medical records reviewed and are as summarized below:  Austin Goodwin is an 68 y.o. male  with medical history significant of TIA, type 2 diabetes, left carotid artery stenosis, GERD who presents with concerns of progressive weakness and decreased p.o. intake.  Patient reports that about a week ago he had hematuria and saw his primary physician and was prescribed antibiotics which resolved his hematuria.  However he then began to note progressive weakness and presented to the emergency room at [facility] on 06/29/2019 where he was noted to have acute kidney injury creatinine of 4.9 going up to 5.06.  However, he left the hospital [place] emergency room with progressive weakness and poor oral intake.  He was found to have acute kidney injury..     Assessment/Plan:   Active Problems:   AKI (acute kidney injury) ([place])   Essential hypertension   Acute renal failure (ARF) ([facility])   Body mass index is 27.8 kg/m.  Acute kidney injury complicated by hyperkalemia: Continue IV fluids.  No signs of obstruction on renal ultrasound.  1 dose of Kayexalate 30 g ordered His creatinine was 1.1 in February 2014.  Consulted nephrologist to assist with management.  Patient probably has underlying CKD, unknown stage)  Right kidney mass versus complex cystic lesion measuring 1.6 cm: MRI abdomen has been ordered for further evaluation.  Type 2 diabetes mellitus: Metformin and glipizide have been held.  Low-dose Lantus has been started for hyperglycemia.  NovoLog as needed for hyperglycemia.  Hypertension: Continue amlodipine.  Lisinopril has been held because of AKI  History of TIA/left carotid artery stenosis/hyperlipidemia: Continue aspirin and Lipitor   Family Communication/Anticipated D/C date and plan/Code Status    DVT prophylaxis: Heparin Code Status: Full code Family Communication: Plan discussed with the patient Disposition Plan: Possible discharge to home in 2 to 3 days      Subjective:   He has no complaints today.  He said he bloody urine and painful urination about a week ago and he was prescribed some antibiotics and his urinalysis cleared up since then.  Objective:    Vitals:   07/02/19 2225 07/02/19 2338 07/03/19 0500 07/03/19 1243  BP: (!) 182/81 (!) 149/72 (!) 168/83 (!) 160/79  Pulse: 81 77 73 80  Resp:   20   Temp: 98.5 F (36.9 C)  98.6 F (37 C) 97.7 F (36.5 C)  TempSrc: Oral  Oral Oral  SpO2: 99%  97% 98%  Weight:      Height:        Intake/Output Summary (Last 24 hours) at 07/03/2019 1337 Last data filed at 07/03/2019 0900 Gross per 24 hour  Intake 840 ml  Output 400 ml  Net 440 ml   Filed Weights   07/02/19 1305  Weight: 93 kg    Exam:  GEN: NAD SKIN: No rash EYES: EOMI ENT: MMM CV: RRR PULM: CTA B ABD: soft, ND, NT, +BS CNS: AAO x 3, non focal EXT: No edema or tenderness   Data Reviewed:   I have personally reviewed following labs and imaging studies:  Labs: Labs show the following:   Basic Metabolic Panel: Recent Labs  Lab 06/29/19 2002 06/30/19 0128 06/30/19 0500 07/02/19 1317 07/03/19 0728  NA 136  --  138 139 140  K  5.0  --  4.0 4.8 5.4*  CL 102  --  105 110 112*  CO2 18*  --  19* 18* 18*  GLUCOSE 334*  --  290* 297* 301*  BUN 97*  --  97* 76* 70*  CREATININE 4.90* 5.06* 4.56* 3.98* 3.71*  CALCIUM 9.1  --  8.3* 8.8* 8.5*   GFR Estimated Creatinine Clearance: 20.6 mL/min (A) (by C-G formula based on SCr of 3.71 mg/dL (H)). Liver Function Tests: Recent Labs  Lab 06/29/19 2002 07/02/19 1317  AST 29 18  ALT 26 28  ALKPHOS 112 100  BILITOT 1.4* 0.9  PROT 7.0 7.1  ALBUMIN 2.9* 3.1*   No results for input(s): LIPASE, AMYLASE in the last 168 hours. No results for input(s): AMMONIA in the last 168 hours.  Coagulation profile No results for input(s): INR, PROTIME in the last 168 hours.  CBC: Recent Labs  Lab 06/29/19 1947 06/30/19 0128 07/02/19 1317 07/03/19 0728  WBC 15.3* 12.9* 13.2* 11.8*  NEUTROABS  --   --  10.7*  --   HGB 13.7 11.9* 14.2 13.4  HCT 40.0 35.3* 42.2 40.1  MCV 102.8* 103.2* 103.4* 104.2*  PLT 182 157 291 254   Cardiac Enzymes: No results for input(s): CKTOTAL, CKMB, CKMBINDEX, TROPONINI in the last 168 hours. BNP (last 3 results) No results for input(s): PROBNP in the last 8760 hours. CBG: Recent Labs  Lab 07/02/19 1316 07/02/19 2340 07/03/19 1024 07/03/19 1201 07/03/19 1317  GLUCAP 276* 276* 335* 302* 184*   D-Dimer: No results for input(s): DDIMER in the last 72 hours. Hgb A1c: No results for input(s): HGBA1C in the last 72 hours. Lipid Profile: No results for input(s): CHOL, HDL, LDLCALC, TRIG, CHOLHDL, LDLDIRECT in the last 72 hours. Thyroid function studies: No results for input(s): TSH, T4TOTAL, T3FREE, THYROIDAB in the last 72 hours.  Invalid input(s): FREET3 Anemia work up: No results for input(s): VITAMINB12, FOLATE, FERRITIN, TIBC, IRON, RETICCTPCT in the last 72 hours. Sepsis Labs: Recent Labs  Lab 06/29/19 1947 06/30/19 0128 07/02/19 1317 07/03/19 0728  WBC 15.3* 12.9* 13.2* 11.8*    Microbiology Recent Results (from the past 240 hour(s))  SARS CORONAVIRUS 2 (TAT 6-24 HRS) Nasopharyngeal Nasopharyngeal Swab     Status: None   Collection Time: 06/29/19 10:17 PM   Specimen: Nasopharyngeal Swab  Result Value Ref Range Status   SARS Coronavirus 2 NEGATIVE NEGATIVE Final    Comment: (NOTE) SARS-CoV-2 target nucleic acids are NOT DETECTED. The SARS-CoV-2 RNA is generally detectable in upper and lower respiratory specimens during the acute phase of infection. Negative results do not preclude SARS-CoV-2 infection, do not rule out co-infections with other pathogens, and should not be used as the sole basis for treatment or other  patient management decisions. Negative results must be combined with clinical observations, patient history, and epidemiological information. The expected result is Negative. Fact Sheet for Patients: SugarRoll.be Fact Sheet for Healthcare Providers: https://www.woods-mathews.com/ This test is not yet approved or cleared by the [place] FDA and  has been authorized for detection and/or diagnosis of SARS-CoV-2 by FDA under an Emergency Use Authorization (EUA). This EUA will remain  in effect (meaning this test can be used) for the duration of the COVID-19 declaration under Section 56 4(b)(1) of the Act, 21 U.S.C. section 360bbb-3(b)(1), unless the authorization is terminated or revoked sooner. Performed at [facility], [place] [address] [place], [place] [address]   SARS CORONAVIRUS 2 (TAT 6-24 HRS) Nasopharyngeal Nasopharyngeal Swab  Status: None   Collection Time: 07/02/19  6:26 PM   Specimen: Nasopharyngeal Swab  Result Value Ref Range Status   SARS Coronavirus 2 NEGATIVE NEGATIVE Final    Comment: (NOTE) SARS-CoV-2 target nucleic acids are NOT DETECTED. The SARS-CoV-2 RNA is generally detectable in upper and lower respiratory specimens during the acute phase of infection. Negative results do not preclude SARS-CoV-2 infection, do not rule out co-infections with other pathogens, and should not be used as the sole basis for treatment or other patient management decisions. Negative results must be combined with clinical observations, patient history, and epidemiological information. The expected result is Negative. Fact Sheet for Patients: SugarRoll.be Fact Sheet for Healthcare Providers: https://www.woods-mathews.com/ This test is not yet approved or cleared by the [place] FDA and  has been authorized for detection and/or diagnosis of SARS-CoV-2 by FDA under an Emergency Use  Authorization (EUA). This EUA will remain  in effect (meaning this test can be used) for the duration of the COVID-19 declaration under Section 56 4(b)(1) of the Act, 21 U.S.C. section 360bbb-3(b)(1), unless the authorization is terminated or revoked sooner. Performed at [facility], [place] [address], [place],  [address]     Procedures and diagnostic studies:  US Renal  Result Date: 07/08/2019 CLINICAL DATA:  Acute kidney injury, diabetes mellitus, hypertension EXAM: RENAL / URINARY TRACT ULTRASOUND COMPLETE COMPARISON:  None FINDINGS: Right Kidney: Renal measurements: 14.0 x 6.3 x 5.7 cm = volume: 260 mL. Normal cortical thickness and echogenicity. Small hypoechoic nodule at lower pole 1.6 x 1.4 x 1.6 cm question complicated cyst versus solid lesion. No hydronephrosis, additional mass or shadowing calcification. Left Kidney: Renal measurements: 13.7 x 6.7 x 6.3 cm = volume: 303 mL. Normal cortical thickness and echogenicity. No mass, hydronephrosis, or shadowing calcification. Bladder: Appears normal for degree of bladder distention. Other: N/A IMPRESSION: 1.6 cm diameter hypoechoic nodule at inferior pole RIGHT kidney question solid mass/tumor versus complex cystic lesion; characterization by MR imaging recommended. Electronically Signed   By: Lavonia Dana M.D.   On: 07-08-2019 08:41    Medications:   . amLODipine  5 mg Oral Daily  . aspirin  81 mg Oral QHS  . heparin injection (subcutaneous)  5,000 Units Subcutaneous Q8H  . insulin aspart  0-15 Units Subcutaneous TID WC  . insulin glargine  5 Units Subcutaneous Daily  . pantoprazole  40 mg Oral Daily  . [START ON 07/04/2019] rosuvastatin  10 mg Oral Weekly   Continuous Infusions: . lactated ringers 125 mL/hr at July 08, 2019 1045     LOS: 0 days   Vieva Brummitt  [facility]   *Please refer to [email or web address], password [email or web address] to get updated schedule on who will round on this patient, as hospitalists switch teams weekly. If  7PM-7AM, please contact night-coverage at [email or web address], password [email or web address] for any overnight needs.  Jul 08, 2019, 1:37 PM

## 2019-07-04 LAB — FERRITIN: Ferritin: 425 ng/mL — ABNORMAL HIGH (ref 24–336)

## 2019-07-04 LAB — ANA W/REFLEX IF POSITIVE: Anti Nuclear Antibody (ANA): NEGATIVE

## 2019-07-04 LAB — BASIC METABOLIC PANEL
Anion gap: 13 (ref 5–15)
BUN: 52 mg/dL — ABNORMAL HIGH (ref 8–23)
CO2: 15 mmol/L — ABNORMAL LOW (ref 22–32)
Calcium: 8.1 mg/dL — ABNORMAL LOW (ref 8.9–10.3)
Chloride: 112 mmol/L — ABNORMAL HIGH (ref 98–111)
Creatinine, Ser: 3.05 mg/dL — ABNORMAL HIGH (ref 0.61–1.24)
GFR calc Af Amer: 23 mL/min — ABNORMAL LOW (ref >60)
GFR calc non Af Amer: 20 mL/min — ABNORMAL LOW (ref >60)
Glucose, Bld: 193 mg/dL — ABNORMAL HIGH (ref 70–99)
Potassium: 4.7 mmol/L (ref 3.5–5.1)
Sodium: 140 mmol/L (ref 135–145)

## 2019-07-04 LAB — CBC WITH DIFFERENTIAL/PLATELET
Abs Immature Granulocytes: 0.1 10*3/uL — ABNORMAL HIGH (ref 0.00–0.07)
Basophils Absolute: 0 10*3/uL (ref 0.0–0.1)
Basophils Relative: 0 %
Eosinophils Absolute: 0.1 10*3/uL (ref 0.0–0.5)
Eosinophils Relative: 1 %
HCT: 32.8 % — ABNORMAL LOW (ref 39.0–52.0)
Hemoglobin: 11 g/dL — ABNORMAL LOW (ref 13.0–17.0)
Immature Granulocytes: 1 %
Lymphocytes Relative: 13 %
Lymphs Abs: 1.3 10*3/uL (ref 0.7–4.0)
MCH: 34.3 pg — ABNORMAL HIGH (ref 26.0–34.0)
MCHC: 33.5 g/dL (ref 30.0–36.0)
MCV: 102.2 fL — ABNORMAL HIGH (ref 80.0–100.0)
Monocytes Absolute: 0.8 10*3/uL (ref 0.1–1.0)
Monocytes Relative: 8 %
Neutro Abs: 7.7 10*3/uL (ref 1.7–7.7)
Neutrophils Relative %: 77 %
Platelets: 241 10*3/uL (ref 150–400)
RBC: 3.21 MIL/uL — ABNORMAL LOW (ref 4.22–5.81)
RDW: 13.2 % (ref 11.5–15.5)
WBC: 9.9 10*3/uL (ref 4.0–10.5)
nRBC: 0 % (ref 0.0–0.2)

## 2019-07-04 LAB — URINE CULTURE: Culture: NO GROWTH

## 2019-07-04 LAB — IRON AND TIBC
Iron: 55 ug/dL (ref 45–182)
Saturation Ratios: 34 % (ref 17.9–39.5)
TIBC: 161 ug/dL — ABNORMAL LOW (ref 250–450)
UIBC: 106 ug/dL

## 2019-07-04 LAB — KAPPA/LAMBDA LIGHT CHAINS
Kappa free light chain: 51.7 mg/L — ABNORMAL HIGH (ref 3.3–19.4)
Kappa, lambda light chain ratio: 1.28 (ref 0.26–1.65)
Lambda free light chains: 40.3 mg/L — ABNORMAL HIGH (ref 5.7–26.3)

## 2019-07-04 LAB — VITAMIN B12: Vitamin B-12: 357 pg/mL (ref 180–914)

## 2019-07-04 LAB — PROTEIN ELECTROPHORESIS, SERUM
A/G Ratio: 0.7 (ref 0.7–1.7)
Albumin ELP: 2.7 g/dL — ABNORMAL LOW (ref 2.9–4.4)
Alpha-1-Globulin: 0.5 g/dL — ABNORMAL HIGH (ref 0.0–0.4)
Alpha-2-Globulin: 1.1 g/dL — ABNORMAL HIGH (ref 0.4–1.0)
Beta Globulin: 0.9 g/dL (ref 0.7–1.3)
Gamma Globulin: 1.2 g/dL (ref 0.4–1.8)
Globulin, Total: 3.7 g/dL (ref 2.2–3.9)
M-Spike, %: 0.4 g/dL — ABNORMAL HIGH
Total Protein ELP: 6.4 g/dL (ref 6.0–8.5)

## 2019-07-04 LAB — GLUCOSE, CAPILLARY
Glucose-Capillary: 230 mg/dL — ABNORMAL HIGH (ref 70–99)
Glucose-Capillary: 235 mg/dL — ABNORMAL HIGH (ref 70–99)

## 2019-07-04 LAB — PARATHYROID HORMONE, INTACT (NO CA): PTH: 24 pg/mL (ref 15–65)

## 2019-07-04 LAB — HCV COMMENT:

## 2019-07-04 LAB — C3 COMPLEMENT: C3 Complement: 157 mg/dL (ref 82–167)

## 2019-07-04 LAB — GLOMERULAR BASEMENT MEMBRANE ANTIBODIES: GBM Ab: 6 units (ref 0–20)

## 2019-07-04 LAB — FOLATE: Folate: 18.5 ng/mL (ref >5.9)

## 2019-07-04 LAB — C4 COMPLEMENT: Complement C4, Body Fluid: 26 mg/dL (ref 12–38)

## 2019-07-04 LAB — HEPATITIS C ANTIBODY (REFLEX): HCV Ab: <0.1 s/co ratio (ref 0.0–0.9)

## 2019-07-04 MED ORDER — AMLODIPINE BESYLATE 10 MG PO TABS: 10.0000 mg | ORAL_TABLET | Freq: Every day | ORAL | 0 refills | Status: DC

## 2019-07-04 NOTE — Discharge Summary (Addendum)
Physician Discharge Summary  STONE SPIRITO JHE:[id number] DOB: Mar 30, 1951 DOA: 07/02/2019  PCP: Reynold Bowen, MD  Admit date: 07/02/2019 Discharge date: 07/04/2019  Discharge disposition: Home  Recommendations for Outpatient Follow-Up:    Follow-up with PCP and nephrologist as scheduled   Discharge Diagnosis:   Active Problems:   AKI (acute kidney injury) ([place])   Essential hypertension   Acute renal failure (ARF) ([place])    Discharge Condition: Stable.  Diet recommendation: Low-salt and low sugar diet Code status: Full code   Hospital Course:    Austin Goodwin is an 68 y.o. male with medical history significant ofTIA, type 2 diabetes, left carotid artery stenosis, GERD who presents with concerns of progressive weakness and decreased p.o. intake. Patient reported that about a week ago he had hematuria and saw his primary physician and was prescribed antibiotics which resolved his hematuria. However, he then began to note progressive weakness and presented to the emergency room at [facility] on 06/29/2019 where he was noted to have acute kidney injury creatinine of 4.9 going up to 5.06.  However, he left the hospital [place].  He presented to [facility] emergency room on 07/02/2019 with progressive weakness and poor oral intake.  He was found to have acute kidney injury.  He was admitted to the hospital for acute kidney injury.  He was treated with IV fluids and nephrologist was consulted to assist with management.  Creatinine has improved but it has not normalized.  From nephrology standpoint, patient can be discharged home with outpatient follow-up in 1 week.  This was discussed with Dr. Murlean Iba.  Incidentally, a right kidney mass versus complex cyst lesion measuring 1.6 cm was discovered on renal ultrasound.  MRI without contrast showed a 9 mm lesion in the lower pole of the right kidney and a 5 mm benign looking cyst in the  pancreas.  He has been advised to follow-up with his PCP for further evaluation of these lesions.   Lisinopril and Metformin were discontinued because of poor kidney function. Amlodipine was increased from 5 mg to 10 mg daily for adequate BP control. Close monitoring of blood pressure glucose levels at home were recommended.  Discharge plan was discussed with the patient and his wife at the bedside.  All their questions were answered.  Medical Consultants:    Nephrologist   Discharge Exam:   Vitals:   07/04/19 1204 07/04/19 1213  BP: (!) 176/74 (!) 158/85  Pulse: 76 68  Resp: 18 20  Temp: (!) 97.5 F (36.4 C) (!) 97.4 F (36.3 C)  SpO2: 98% 100%   Vitals:   07/03/19 2006 07/04/19 0424 07/04/19 1204 07/04/19 1213  BP: (!) 168/85 (!) 165/73 (!) 176/74 (!) 158/85  Pulse: 83 78 76 68  Resp: 17 20 18 20   Temp: 98.3 F (36.8 C) 98.4 F (36.9 C) (!) 97.5 F (36.4 C) (!) 97.4 F (36.3 C)  TempSrc: Oral  Oral Oral  SpO2: 97% 96% 98% 100%  Weight:      Height:         GEN: NAD SKIN: No rash EYES: EOMI ENT: MMM CV: RRR PULM: CTA B ABD: soft, ND, NT, +BS CNS: AAO x 3, non focal EXT: No edema or tenderness   The results of significant diagnostics from this hospitalization (including imaging, microbiology, ancillary and laboratory) are listed below for reference.     Procedures and Diagnostic Studies:   Mr Abdomen Wo Contrast  Result Date: 07/03/2019  CLINICAL DATA:  68 year old male with history of renal mass. Renal insufficiency. EXAM: MRI ABDOMEN WITHOUT CONTRAST TECHNIQUE: Multiplanar multisequence MR imaging was performed without the administration of intravenous contrast. COMPARISON:  No priors.  Renal ultrasound 07/03/2019. FINDINGS: Comment: Today's study is limited for detection and characterization of visceral and/or vascular lesions by lack of IV gadolinium. Lower chest: Unremarkable. Hepatobiliary: Liver is incompletely imaged, but within the visualized  portions of the liver there are no definite suspicious cystic or solid hepatic lesions confidently identified on today's noncontrast examination. Small filling defects lying dependently in the gallbladder, compatible with cholelithiasis. No evidence to suggest an acute cholecystitis at this time. No intra or extrahepatic biliary ductal dilatation. Pancreas: In the body of the pancreas (axial image 10 of series 4) there is a well-defined T1 hypointense, T2 hyperintense 5 mm lesion which is incompletely characterized on today's noncontrast examination, but favored to represent a small benign lesions such as a tiny pancreatic pseudocyst. No other definite pancreatic mass or peripancreatic fluid collections or inflammatory changes noted on today's noncontrast examination. Spleen:  Unremarkable. Adrenals/Urinary Tract: Areas of mild increased T2 signal intensity in the retroperitoneal fat adjacent to both kidneys, nonspecific. In the lower pole of the right kidney there is a 9 mm T1 isointense, T2 mildly hyperintense lesion (coronal image 13 of series 2), incompletely characterized on today's noncontrast examination, but statistically likely a cyst. Left kidney and bilateral adrenal glands are otherwise normal in appearance. No hydroureteronephrosis in the visualized portions of the abdomen. Stomach/Bowel: Visualized portions are unremarkable. Vascular/Lymphatic: No aneurysm identified in the visualized abdominal vasculature. Other: No significant volume of ascites noted in the visualized portions of the peritoneal cavity. Musculoskeletal: No aggressive appearing osseous lesions are noted in the visualized portions of the skeleton on today's noncontrast examination. IMPRESSION: 1. 9 mm lesion in the lower pole the right kidney is incompletely characterized on today's noncontrast examination, but has imaging characteristics favoring a small cyst. If this is of clinical concern, future evaluations could be performed with  IV gadolinium to provide definitive characterization. 2. Nonspecific increased T2 signal intensity around the kidneys bilaterally which can be seen in the setting of renal insufficiency. 3. Cholelithiasis without evidence of acute cholecystitis at this time. 4. Benign-appearing 5 mm cystic lesion in the body of the pancreas. Followup abdominal MRI with and without IV gadolinium with MRCP or pancreatic protocol CT scan is recommended in 2 years to ensure the stability or resolution of this finding. This recommendation follows ACR consensus guidelines: Management of Incidental Pancreatic Cysts: A White Paper of the ACR Incidental Findings Committee. [place] [id number];89:[id number]. Electronically Signed   By: Vinnie Langton M.D.   On: 07/03/2019 14:22   US Renal  Result Date: 07/03/2019 CLINICAL DATA:  Acute kidney injury, diabetes mellitus, hypertension EXAM: RENAL / URINARY TRACT ULTRASOUND COMPLETE COMPARISON:  None FINDINGS: Right Kidney: Renal measurements: 14.0 x 6.3 x 5.7 cm = volume: 260 mL. Normal cortical thickness and echogenicity. Small hypoechoic nodule at lower pole 1.6 x 1.4 x 1.6 cm question complicated cyst versus solid lesion. No hydronephrosis, additional mass or shadowing calcification. Left Kidney: Renal measurements: 13.7 x 6.7 x 6.3 cm = volume: 303 mL. Normal cortical thickness and echogenicity. No mass, hydronephrosis, or shadowing calcification. Bladder: Appears normal for degree of bladder distention. Other: N/A IMPRESSION: 1.6 cm diameter hypoechoic nodule at inferior pole RIGHT kidney question solid mass/tumor versus complex cystic lesion; characterization by MR imaging recommended. Electronically Signed   By: Elta Guadeloupe  Thornton Papas M.D.   On: 07/03/2019 08:41     Labs:   Basic Metabolic Panel: Recent Labs  Lab 06/29/19 2002 06/30/19 0128 06/30/19 0500 07/02/19 1317 07/03/19 0728 07/04/19 0402  NA 136  --  138 139 140 140  K 5.0  --  4.0 4.8 5.4* 4.7  CL 102  --  105 110 112*  112*  CO2 18*  --  19* 18* 18* 15*  GLUCOSE 334*  --  290* 297* 301* 193*  BUN 97*  --  97* 76* 70* 52*  CREATININE 4.90* 5.06* 4.56* 3.98* 3.71* 3.05*  CALCIUM 9.1  --  8.3* 8.8* 8.5* 8.1*   GFR Estimated Creatinine Clearance: 25.1 mL/min (A) (by C-G formula based on SCr of 3.05 mg/dL (H)). Liver Function Tests: Recent Labs  Lab 06/29/19 2002 07/02/19 1317  AST 29 18  ALT 26 28  ALKPHOS 112 100  BILITOT 1.4* 0.9  PROT 7.0 7.1  ALBUMIN 2.9* 3.1*   No results for input(s): LIPASE, AMYLASE in the last 168 hours. No results for input(s): AMMONIA in the last 168 hours. Coagulation profile No results for input(s): INR, PROTIME in the last 168 hours.  CBC: Recent Labs  Lab 06/29/19 1947 06/30/19 0128 07/02/19 1317 07/03/19 0728 07/04/19 0402  WBC 15.3* 12.9* 13.2* 11.8* 9.9  NEUTROABS  --   --  10.7*  --  7.7  HGB 13.7 11.9* 14.2 13.4 11.0*  HCT 40.0 35.3* 42.2 40.1 32.8*  MCV 102.8* 103.2* 103.4* 104.2* 102.2*  PLT 182 157 291 254 241   Cardiac Enzymes: No results for input(s): CKTOTAL, CKMB, CKMBINDEX, TROPONINI in the last 168 hours. BNP: Invalid input(s): POCBNP CBG: Recent Labs  Lab 07/03/19 1317 07/03/19 1709 07/03/19 2031 07/04/19 0740 07/04/19 1138  GLUCAP 184* 97 148* 230* 235*   D-Dimer No results for input(s): DDIMER in the last 72 hours. Hgb A1c No results for input(s): HGBA1C in the last 72 hours. Lipid Profile No results for input(s): CHOL, HDL, LDLCALC, TRIG, CHOLHDL, LDLDIRECT in the last 72 hours. Thyroid function studies No results for input(s): TSH, T4TOTAL, T3FREE, THYROIDAB in the last 72 hours.  Invalid input(s): FREET3 Anemia work up Recent Labs    07/04/19 0402  FOLATE 18.5  FERRITIN 425*  TIBC 161*  IRON 55   Microbiology Recent Results (from the past 240 hour(s))  SARS CORONAVIRUS 2 (TAT 6-24 HRS) Nasopharyngeal Nasopharyngeal Swab     Status: None   Collection Time: 06/29/19 10:17 PM   Specimen: Nasopharyngeal Swab   Result Value Ref Range Status   SARS Coronavirus 2 NEGATIVE NEGATIVE Final    Comment: (NOTE) SARS-CoV-2 target nucleic acids are NOT DETECTED. The SARS-CoV-2 RNA is generally detectable in upper and lower respiratory specimens during the acute phase of infection. Negative results do not preclude SARS-CoV-2 infection, do not rule out co-infections with other pathogens, and should not be used as the sole basis for treatment or other patient management decisions. Negative results must be combined with clinical observations, patient history, and epidemiological information. The expected result is Negative. Fact Sheet for Patients: SugarRoll.be Fact Sheet for Healthcare Providers: https://www.woods-mathews.com/ This test is not yet approved or cleared by the [place] FDA and  has been authorized for detection and/or diagnosis of SARS-CoV-2 by FDA under an Emergency Use Authorization (EUA). This EUA will remain  in effect (meaning this test can be used) for the duration of the COVID-19 declaration under Section 56 4(b)(1) of the Act, 21 U.S.C. section 360bbb-3(b)(1), unless  the authorization is terminated or revoked sooner. Performed at [facility], [place] [address], [place], [place] [address]   Urine culture     Status: None   Collection Time: 07/02/19  5:46 PM   Specimen: Urine, Random  Result Value Ref Range Status   Specimen Description   Final    URINE, RANDOM Performed at [facility], [address], [place], [place] [address]    Special Requests   Final    NONE Performed at [facility], [address], [place], [place] [address]    Culture   Final    NO GROWTH Performed at [facility], [place] [address], [place], [place] [address]    Report Status 07/04/2019 FINAL  Final  SARS CORONAVIRUS 2 (TAT 6-24 HRS) Nasopharyngeal Nasopharyngeal Swab     Status: None   Collection Time: 07/02/19  6:26 PM    Specimen: Nasopharyngeal Swab  Result Value Ref Range Status   SARS Coronavirus 2 NEGATIVE NEGATIVE Final    Comment: (NOTE) SARS-CoV-2 target nucleic acids are NOT DETECTED. The SARS-CoV-2 RNA is generally detectable in upper and lower respiratory specimens during the acute phase of infection. Negative results do not preclude SARS-CoV-2 infection, do not rule out co-infections with other pathogens, and should not be used as the sole basis for treatment or other patient management decisions. Negative results must be combined with clinical observations, patient history, and epidemiological information. The expected result is Negative. Fact Sheet for Patients: SugarRoll.be Fact Sheet for Healthcare Providers: https://www.woods-mathews.com/ This test is not yet approved or cleared by the [place] FDA and  has been authorized for detection and/or diagnosis of SARS-CoV-2 by FDA under an Emergency Use Authorization (EUA). This EUA will remain  in effect (meaning this test can be used) for the duration of the COVID-19 declaration under Section 56 4(b)(1) of the Act, 21 U.S.C. section 360bbb-3(b)(1), unless the authorization is terminated or revoked sooner. Performed at [facility], [place] [address], [place], [place] [address]      Discharge Instructions:   Discharge Instructions    Diet - low sodium heart healthy   Complete by: As directed    Diet Carb Modified   Complete by: As directed    Increase activity slowly   Complete by: As directed      Allergies as of 07/04/2019      Reactions   Levofloxacin Hives   Other       Medication List    STOP taking these medications   lisinopril 10 MG tablet Commonly known as: ZESTRIL   metFORMIN 500 MG tablet Commonly known as: GLUCOPHAGE   omeprazole 20 MG capsule Commonly known as: PRILOSEC     TAKE these medications   amLODipine 10 MG tablet Commonly known as: NORVASC  Take 1 tablet (10 mg total) by mouth daily. What changed:   medication strength  how much to take   aspirin 81 MG chewable tablet Chew 81 mg by mouth at bedtime.   glipiZIDE 10 MG tablet Commonly known as: GLUCOTROL Take 10 mg by mouth 2 (two) times daily before a meal.   rosuvastatin 10 MG tablet Commonly known as: CRESTOR Take 10 mg by mouth once a week. Wednesday   traMADol 50 MG tablet Commonly known as: ULTRAM Take 50 mg by mouth 2 (two) times daily as needed for pain.         Time coordinating discharge: 32 minutes Signed:  Jennye Boroughs  [facility] 07/04/2019, 2:22 PM

## 2019-07-04 NOTE — Progress Notes (Signed)
[facility], [place] 07/04/19  Subjective:   Hospital day # 1 Patient feels better today Able to eat without nausea or vomiting No leg edema MRI of the kidney is still inconclusive about the renal mass  Renal: 12/08 0701 - 12/09 0700 In: 1202.3 [P.O.:360; I.V.:842.3] Out: 600 [Urine:600] Lab Results  Component Value Date   CREATININE 3.05 (H) 07/04/2019   CREATININE 3.71 (H) 07/03/2019   CREATININE 3.98 (H) 07/02/2019     Objective:  Vital signs in last 24 hours:  Temp:  [97.4 F (36.3 C)-98.4 F (36.9 C)] 97.4 F (36.3 C) (12/09 1213) Pulse Rate:  [68-83] 68 (12/09 1213) Resp:  [17-20] 20 (12/09 1213) BP: (158-176)/(73-85) 158/85 (12/09 1213) SpO2:  [96 %-100 %] 100 % (12/09 1213)  Weight change:  Filed Weights   07/02/19 1305  Weight: 93 kg    Intake/Output:    Intake/Output Summary (Last 24 hours) at 07/04/2019 1809 Last data filed at 07/04/2019 1108 Gross per 24 hour  Intake 994.31 ml  Output 900 ml  Net 94.31 ml    Physical Exam: General:  No acute distress, sitting up on the side of the bed  HEENT  anicteric, moist oral mucous membranes  Neck:  Supple, no masses  Lungs:  Normal breathing effort, clear to auscultation  Heart::  Regular, soft systolic murmur  Abdomen:  Soft, nontender  Extremities:  Left leg 1+ edema   Neurologic:  Alert, oriented, following commands  Skin:  No acute rashes     Basic Metabolic Panel:  Recent Labs  Lab 06/29/19 2002 06/30/19 0128 06/30/19 0500 07/02/19 1317 07/03/19 0728 07/04/19 0402  NA 136  --  138 139 140 140  K 5.0  --  4.0 4.8 5.4* 4.7  CL 102  --  105 110 112* 112*  CO2 18*  --  19* 18* 18* 15*  GLUCOSE 334*  --  290* 297* 301* 193*  BUN 97*  --  97* 76* 70* 52*  CREATININE 4.90* 5.06* 4.56* 3.98* 3.71* 3.05*  CALCIUM 9.1  --  8.3* 8.8* 8.5* 8.1*     CBC: Recent Labs  Lab 06/29/19 1947 06/30/19 0128 07/02/19 1317 07/03/19 0728 07/04/19 0402  WBC 15.3*  12.9* 13.2* 11.8* 9.9  NEUTROABS  --   --  10.7*  --  7.7  HGB 13.7 11.9* 14.2 13.4 11.0*  HCT 40.0 35.3* 42.2 40.1 32.8*  MCV 102.8* 103.2* 103.4* 104.2* 102.2*  PLT 182 157 291 254 241      Lab Results  Component Value Date   HEPBSAG NON REACTIVE 07/03/2019   HEPBSAB NON REACTIVE 07/03/2019      Microbiology:  Recent Results (from the past 240 hour(s))  SARS CORONAVIRUS 2 (TAT 6-24 HRS) Nasopharyngeal Nasopharyngeal Swab     Status: None   Collection Time: 06/29/19 10:17 PM   Specimen: Nasopharyngeal Swab  Result Value Ref Range Status   SARS Coronavirus 2 NEGATIVE NEGATIVE Final    Comment: (NOTE) SARS-CoV-2 target nucleic acids are NOT DETECTED. The SARS-CoV-2 RNA is generally detectable in upper and lower respiratory specimens during the acute phase of infection. Negative results do not preclude SARS-CoV-2 infection, do not rule out co-infections with other pathogens, and should not be used as the sole basis for treatment or other patient management decisions. Negative results must be combined with clinical observations, patient history, and epidemiological information. The expected result is Negative. Fact Sheet for Patients: SugarRoll.be Fact Sheet for Healthcare Providers: https://www.woods-mathews.com/ This test is  not yet approved or cleared by the [place] and  has been authorized for detection and/or diagnosis of SARS-CoV-2 by FDA under an Emergency Use Authorization (EUA). This EUA will remain  in effect (meaning this test can be used) for the duration of the COVID-19 declaration under Section 56 4(b)(1) of the Act, 21 U.S.C. section 360bbb-3(b)(1), unless the authorization is terminated or revoked sooner. Performed at [facility], [place] [address] [place], [place] [address]   Urine culture     Status: None   Collection Time: 07/02/19  5:46 PM   Specimen: Urine, Random  Result Value Ref Range  Status   Specimen Description   Final    URINE, RANDOM Performed at [facility], [address] [place], [place] [address]    Special Requests   Final    NONE Performed at [facility], [address], [place], [place] [address]    Culture   Final    NO GROWTH Performed at [facility], [place] [address] [place], [place] [address]    Report Status 07/04/2019 FINAL  Final  SARS CORONAVIRUS 2 (TAT 6-24 HRS) Nasopharyngeal Nasopharyngeal Swab     Status: None   Collection Time: 07/02/19  6:26 PM   Specimen: Nasopharyngeal Swab  Result Value Ref Range Status   SARS Coronavirus 2 NEGATIVE NEGATIVE Final    Comment: (NOTE) SARS-CoV-2 target nucleic acids are NOT DETECTED. The SARS-CoV-2 RNA is generally detectable in upper and lower respiratory specimens during the acute phase of infection. Negative results do not preclude SARS-CoV-2 infection, do not rule out co-infections with other pathogens, and should not be used as the sole basis for treatment or other patient management decisions. Negative results must be combined with clinical observations, patient history, and epidemiological information. The expected result is Negative. Fact Sheet for Patients: SugarRoll.be Fact Sheet for Healthcare Providers: https://www.woods-mathews.com/ This test is not yet approved or cleared by the [place] FDA and  has been authorized for detection and/or diagnosis of SARS-CoV-2 by FDA under an Emergency Use Authorization (EUA). This EUA will remain  in effect (meaning this test can be used) for the duration of the COVID-19 declaration under Section 56 4(b)(1) of the Act, 21 U.S.C. section 360bbb-3(b)(1), unless the authorization is terminated or revoked sooner. Performed at [facility], [place] [address] [place], [place] [address]     Coagulation Studies: No results for input(s): LABPROT, INR in the last 72  hours.  Urinalysis: Recent Labs    07/02/19 1746  COLORURINE YELLOW*  LABSPEC 1.008  PHURINE 5.0  GLUCOSEU >=500*  HGBUR MODERATE*  BILIRUBINUR NEGATIVE  KETONESUR NEGATIVE  PROTEINUR NEGATIVE  NITRITE NEGATIVE  LEUKOCYTESUR SMALL*      Imaging: Mr Abdomen Wo Contrast  Result Date: 07/03/2019 CLINICAL DATA:  68 year old male with history of renal mass. Renal insufficiency. EXAM: MRI ABDOMEN WITHOUT CONTRAST TECHNIQUE: Multiplanar multisequence MR imaging was performed without the administration of intravenous contrast. COMPARISON:  No priors.  Renal ultrasound 07/03/2019. FINDINGS: Comment: Today's study is limited for detection and characterization of visceral and/or vascular lesions by lack of IV gadolinium. Lower chest: Unremarkable. Hepatobiliary: Liver is incompletely imaged, but within the visualized portions of the liver there are no definite suspicious cystic or solid hepatic lesions confidently identified on today's noncontrast examination. Small filling defects lying dependently in the gallbladder, compatible with cholelithiasis. No evidence to suggest an acute cholecystitis at this time. No intra or extrahepatic biliary ductal dilatation. Pancreas:  In the body of the pancreas (axial image 10 of series 4) there is a well-defined T1 hypointense, T2 hyperintense 5 mm lesion which is incompletely characterized on today's noncontrast examination, but favored to represent a small benign lesions such as a tiny pancreatic pseudocyst. No other definite pancreatic mass or peripancreatic fluid collections or inflammatory changes noted on today's noncontrast examination. Spleen:  Unremarkable. Adrenals/Urinary Tract: Areas of mild increased T2 signal intensity in the retroperitoneal fat adjacent to both kidneys, nonspecific. In the lower pole of the right kidney there is a 9 mm T1 isointense, T2 mildly hyperintense lesion (coronal image 13 of series 2), incompletely characterized on today's  noncontrast examination, but statistically likely a cyst. Left kidney and bilateral adrenal glands are otherwise normal in appearance. No hydroureteronephrosis in the visualized portions of the abdomen. Stomach/Bowel: Visualized portions are unremarkable. Vascular/Lymphatic: No aneurysm identified in the visualized abdominal vasculature. Other: No significant volume of ascites noted in the visualized portions of the peritoneal cavity. Musculoskeletal: No aggressive appearing osseous lesions are noted in the visualized portions of the skeleton on today's noncontrast examination. IMPRESSION: 1. 9 mm lesion in the lower pole the right kidney is incompletely characterized on today's noncontrast examination, but has imaging characteristics favoring a small cyst. If this is of clinical concern, future evaluations could be performed with IV gadolinium to provide definitive characterization. 2. Nonspecific increased T2 signal intensity around the kidneys bilaterally which can be seen in the setting of renal insufficiency. 3. Cholelithiasis without evidence of acute cholecystitis at this time. 4. Benign-appearing 5 mm cystic lesion in the body of the pancreas. Followup abdominal MRI with and without IV gadolinium with MRCP or pancreatic protocol CT scan is recommended in 2 years to ensure the stability or resolution of this finding. This recommendation follows ACR consensus guidelines: Management of Incidental Pancreatic Cysts: A White Paper of the ACR Incidental Findings Committee. Foster [id number];[id number]. Electronically Signed   By: Vinnie Langton M.D.   On: 07/03/2019 14:22   US Renal  Result Date: 07/03/2019 CLINICAL DATA:  Acute kidney injury, diabetes mellitus, hypertension EXAM: RENAL / URINARY TRACT ULTRASOUND COMPLETE COMPARISON:  None FINDINGS: Right Kidney: Renal measurements: 14.0 x 6.3 x 5.7 cm = volume: 260 mL. Normal cortical thickness and echogenicity. Small hypoechoic nodule at lower pole 1.6  x 1.4 x 1.6 cm question complicated cyst versus solid lesion. No hydronephrosis, additional mass or shadowing calcification. Left Kidney: Renal measurements: 13.7 x 6.7 x 6.3 cm = volume: 303 mL. Normal cortical thickness and echogenicity. No mass, hydronephrosis, or shadowing calcification. Bladder: Appears normal for degree of bladder distention. Other: N/A IMPRESSION: 1.6 cm diameter hypoechoic nodule at inferior pole RIGHT kidney question solid mass/tumor versus complex cystic lesion; characterization by MR imaging recommended. Electronically Signed   By: Lavonia Dana M.D.   On: 07/03/2019 08:41     Medications:   . lactated ringers 125 mL/hr at 07/04/19 1149   . amLODipine  5 mg Oral Daily  . aspirin  81 mg Oral QHS  . heparin injection (subcutaneous)  5,000 Units Subcutaneous Q8H  . insulin aspart  0-15 Units Subcutaneous TID WC  . insulin glargine  5 Units Subcutaneous Daily  . pantoprazole  40 mg Oral Daily  . rosuvastatin  10 mg Oral Weekly   labetalol, traMADol  Assessment/ Plan:  68 y.o. male male with diabetes, was admitted on 07/02/2019 with generalized weakness, gross hematuria and dysuria  1.  Acute kidney injury, baseline creatinine unknown 2.  Hematuria and dysuria 3.  Chronic kidney disease unspecified 4.  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  Hypoalbuminemia 6.  MGUS 7.  Renal mass/cyst   Creatinine peaked at 5.06, improved to 3.05 now Work-up so far shows urinalysis with hematuria, glucosuria, negative for protein Renal ultrasound shows 1.6 cm inferior pole right kidney solid tumor versus complex cystic lesion.  MRI shows 0.9 cm lesion which is indeterminate but possibly small cyst. -Serological studies: HCV negative, hepatitis B  Neg, PTH normal, anti-GBM antibody negative, complements normal, kappa to lambda ratio normal, serum protein electrophoresis with a small M spike of 0.4 g, ANA negative, ANCA titers pending  Patient likely has underlying  chronic kidney disease due to diabetes.  Baseline creatinine is unknown at this time.  Last known creatinine was 1.10 in February 2014 Cause for gross hematuria and dysuria is unclear.  Patient will need urology evaluation and cystoscopy as outpatient. -We will follow up with patient in our office next week     LOS: [place] 12/9/20206:09 PM  [place], Cora  Note: This note was prepared with Dragon dictation. Any transcription errors are unintentional

## 2019-07-04 NOTE — Progress Notes (Signed)
[name] to be D/C'd home with wife per MD order.  Discussed prescriptions and follow up appointments with the patient. Prescriptions given to patient, medication list explained in detail. Pt verbalized understanding.  Allergies as of 07/04/2019       Reactions   Levofloxacin Hives   Other         Medication List     STOP taking these medications    lisinopril 10 MG tablet Commonly known as: ZESTRIL   metFORMIN 500 MG tablet Commonly known as: GLUCOPHAGE   omeprazole 20 MG capsule Commonly known as: PRILOSEC       TAKE these medications    amLODipine 10 MG tablet Commonly known as: NORVASC Take 1 tablet (10 mg total) by mouth daily. What changed:  medication strength how much to take Notes to patient: Morning 07/05/19   aspirin 81 MG chewable tablet Chew 81 mg by mouth at bedtime. Notes to patient: Before bed 07/04/19   glipiZIDE 10 MG tablet Commonly known as: GLUCOTROL Take 10 mg by mouth 2 (two) times daily before a meal. Notes to patient: Before evening meal 07/04/19   rosuvastatin 10 MG tablet Commonly known as: CRESTOR Take 10 mg by mouth once a week. Wednesday Notes to patient: 07/11/19   traMADol 50 MG tablet Commonly known as: ULTRAM Take 50 mg by mouth 2 (two) times daily as needed for pain. Notes to patient: As needed        Vitals:   07/04/19 1204 07/04/19 1213  BP: (!) 176/74 (!) 158/85  Pulse: 76 68  Resp: 18 20  Temp: (!) 97.5 F (36.4 C) (!) 97.4 F (36.3 C)  SpO2: 98% 100%    Skin clean, dry and intact without evidence of skin break down, no evidence of skin tears noted. IV catheter discontinued intact. Site without signs and symptoms of complications. Dressing and pressure applied. Pt denies pain at this time. No complaints noted.  An After Visit Summary was printed and given to the patient. Patient escorted via [place], and D/C home via private auto.  Nicholson A Imogene Gravelle

## 2019-07-06 LAB — PROTEIN ELECTRO, RANDOM URINE
Albumin ELP, Urine: 53 %
Alpha-1-Globulin, U: 4.9 %
Alpha-2-Globulin, U: 18.2 %
Beta Globulin, U: 19.2 %
Gamma Globulin, U: 4.7 %
Total Protein, Urine: 15.6 mg/dL

## 2019-07-09 LAB — ANCA TITERS
Atypical P-ANCA titer: <1:20 {titer}
C-ANCA: <1:20 {titer}
P-ANCA: <1:20 {titer}

## 2019-07-11 ENCOUNTER — Telehealth: Payer: Self-pay | Admitting: Hematology and Oncology

## 2019-07-11 NOTE — Telephone Encounter (Signed)
Received a new hem referral from Dr. Forde Dandy for abnormal labs. I cld and spoke to Austin Goodwin' wife and scheduled him to see Dr. Lorenso Courier on 1/4 at [place] to arrive 15 minutes early.

## 2019-07-12 DIAGNOSIS — N179 Acute kidney failure, unspecified: Secondary | ICD-10-CM | POA: Diagnosis not present

## 2019-07-12 DIAGNOSIS — R319 Hematuria, unspecified: Secondary | ICD-10-CM | POA: Diagnosis not present

## 2019-07-12 DIAGNOSIS — D472 Monoclonal gammopathy: Secondary | ICD-10-CM | POA: Diagnosis not present

## 2019-07-12 DIAGNOSIS — I10 Essential (primary) hypertension: Secondary | ICD-10-CM | POA: Diagnosis not present

## 2019-07-23 DIAGNOSIS — E875 Hyperkalemia: Secondary | ICD-10-CM | POA: Diagnosis not present

## 2019-07-23 DIAGNOSIS — N179 Acute kidney failure, unspecified: Secondary | ICD-10-CM | POA: Diagnosis not present

## 2019-07-30 ENCOUNTER — Other Ambulatory Visit: Payer: Self-pay

## 2019-07-30 ENCOUNTER — Inpatient Hospital Stay: Payer: Medicare Other | Attending: Hematology and Oncology | Admitting: Hematology and Oncology

## 2019-07-30 ENCOUNTER — Encounter: Payer: Self-pay | Admitting: *Deleted

## 2019-07-30 ENCOUNTER — Telehealth: Payer: Self-pay

## 2019-07-30 ENCOUNTER — Inpatient Hospital Stay: Payer: Medicare Other

## 2019-07-30 VITALS — BP 148/58 | HR 80 | Temp 98.2°F | Resp 18 | Ht 72.0 in | Wt 186.0 lb

## 2019-07-30 DIAGNOSIS — D472 Monoclonal gammopathy: Secondary | ICD-10-CM

## 2019-07-30 DIAGNOSIS — D539 Nutritional anemia, unspecified: Secondary | ICD-10-CM

## 2019-07-30 DIAGNOSIS — E119 Type 2 diabetes mellitus without complications: Secondary | ICD-10-CM | POA: Diagnosis not present

## 2019-07-30 DIAGNOSIS — N179 Acute kidney failure, unspecified: Secondary | ICD-10-CM

## 2019-07-30 DIAGNOSIS — N289 Disorder of kidney and ureter, unspecified: Secondary | ICD-10-CM

## 2019-07-30 DIAGNOSIS — I10 Essential (primary) hypertension: Secondary | ICD-10-CM

## 2019-07-30 LAB — CMP (CANCER CENTER ONLY)
ALT: 19 U/L (ref 0–44)
AST: 9 U/L — ABNORMAL LOW (ref 15–41)
Albumin: 3.2 g/dL — ABNORMAL LOW (ref 3.5–5.0)
Alkaline Phosphatase: 85 U/L (ref 38–126)
Anion gap: 15 (ref 5–15)
BUN: 92 mg/dL — ABNORMAL HIGH (ref 8–23)
CO2: 16 mmol/L — ABNORMAL LOW (ref 22–32)
Calcium: 9 mg/dL (ref 8.9–10.3)
Chloride: 99 mmol/L (ref 98–111)
Creatinine: 7.85 mg/dL (ref 0.61–1.24)
GFR, Est AFR Am: 7 mL/min — ABNORMAL LOW (ref >60)
GFR, Estimated: 6 mL/min — ABNORMAL LOW (ref >60)
Glucose, Bld: 188 mg/dL — ABNORMAL HIGH (ref 70–99)
Potassium: 5.4 mmol/L — ABNORMAL HIGH (ref 3.5–5.1)
Sodium: 130 mmol/L — ABNORMAL LOW (ref 135–145)
Total Bilirubin: 0.9 mg/dL (ref 0.3–1.2)
Total Protein: 7.6 g/dL (ref 6.5–8.1)

## 2019-07-30 LAB — CBC WITH DIFFERENTIAL (CANCER CENTER ONLY)
Abs Immature Granulocytes: 0.07 10*3/uL (ref 0.00–0.07)
Basophils Absolute: 0 10*3/uL (ref 0.0–0.1)
Basophils Relative: 0 %
Eosinophils Absolute: 0 10*3/uL (ref 0.0–0.5)
Eosinophils Relative: 0 %
HCT: 30.5 % — ABNORMAL LOW (ref 39.0–52.0)
Hemoglobin: 10.1 g/dL — ABNORMAL LOW (ref 13.0–17.0)
Immature Granulocytes: 1 %
Lymphocytes Relative: 9 %
Lymphs Abs: 1.3 10*3/uL (ref 0.7–4.0)
MCH: 33.8 pg (ref 26.0–34.0)
MCHC: 33.1 g/dL (ref 30.0–36.0)
MCV: 102 fL — ABNORMAL HIGH (ref 80.0–100.0)
Monocytes Absolute: 0.9 10*3/uL (ref 0.1–1.0)
Monocytes Relative: 6 %
Neutro Abs: 11.9 10*3/uL — ABNORMAL HIGH (ref 1.7–7.7)
Neutrophils Relative %: 84 %
Platelet Count: 248 10*3/uL (ref 150–400)
RBC: 2.99 MIL/uL — ABNORMAL LOW (ref 4.22–5.81)
RDW: 12.5 % (ref 11.5–15.5)
WBC Count: 14.2 10*3/uL — ABNORMAL HIGH (ref 4.0–10.5)
nRBC: 0 % (ref 0.0–0.2)

## 2019-07-30 LAB — FOLATE: Folate: 12.6 ng/mL (ref >5.9)

## 2019-07-30 LAB — TSH: TSH: 0.5 u[IU]/mL (ref 0.320–4.118)

## 2019-07-30 LAB — VITAMIN B12: Vitamin B-12: 223 pg/mL (ref 180–914)

## 2019-07-30 LAB — RETIC PANEL
Immature Retic Fract: 13.8 % (ref 2.3–15.9)
RBC.: 3.02 MIL/uL — ABNORMAL LOW (ref 4.22–5.81)
Retic Count, Absolute: 31.4 10*3/uL (ref 19.0–186.0)
Retic Ct Pct: 1 % (ref 0.4–3.1)
Reticulocyte Hemoglobin: 35.3 pg (ref >27.9)

## 2019-07-30 LAB — LACTATE DEHYDROGENASE: LDH: 105 U/L (ref 98–192)

## 2019-07-30 LAB — DIRECT ANTIGLOBULIN TEST (NOT AT ARMC)
DAT, IgG: NEGATIVE
DAT, complement: NEGATIVE

## 2019-07-30 LAB — SAVE SMEAR(SSMR), FOR PROVIDER SLIDE REVIEW

## 2019-07-30 NOTE — Telephone Encounter (Signed)
CRITICAL VALUE STICKER  CRITICAL VALUE: Creatinine 7.85  RECEIVER (on-site recipient of call): Lenox Ponds LPN  DATE & TIME NOTIFIED: 07/30/19 1410  MESSENGER (representative from lab):  MD NOTIFIED: Message given to Desk Nurse Drucie Ip RN  TIME OF NOTIFICATION: 0300  RESPONSE:

## 2019-07-30 NOTE — Progress Notes (Signed)
[place] [phone]   Fax:([phone] [place] NOTE  Patient Care Team: Reynold Bowen, MD as PCP - General (Endocrinology)  Hematological/Oncological History # Monoclonal Gammopathy of Undetermined Significance 1) 07/03/2019: SPEP showed M protein 0.4, kappa 51.7, lambda 40.3, ratio 1.28.  2) 07/30/2019: establish care with Dr. Lorenso Courier   #Renal Lesion  1) 07/03/2019: US renal shows 1.6 cm diameter hypoechoic nodule at inferior pole of the right kidney 2) 07/03/2019: MRI abdomen reveals 9 mm lesion in the lower pole the right kidney is incompletely characterized on today's noncontrast examination, but has imagineg characteristics favoring a small cyst. 3) 07/30/2019: establish care with Dr. Lorenso Courier   CHIEF COMPLAINTS/PURPOSE OF CONSULTATION:  MGUS evaluation  HISTORY OF PRESENTING ILLNESS:  Austin Goodwin 69 y.o. 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   On review of the prior records, Austin Goodwin was seen by his PCP on 06/27/2019 for hematuria. He was administered PO antibiotics with some resolution of the hematuria. He began having progressive weakness and reported to [facility] ED on 06/29/2019. He was noted to have an AKI with Cr 5.06. He left AMA at that time and presented to [facility] ED on 07/02/19 with progressive weakness and poor oral intake. He was admitted for his acute AKI. He was d/c on 07/04/2019 after IV hydration with some modest improvement in his Cr down to 3.0. During his inpatient evaluation he was found to have an MGUS and a small sub-centimeter right renal lesion. He was referred to hematology for further evaluation and management of the MGUS.  On discussion today Austin Goodwin notes that he did indeed have painless blood in his urine in early December.  He reports that the hematuria has subsequently resolved.  He reports that he  has not been eating as much and that his appetite has been poor since that time.  He also notes that he has been having increasing fatigue and weight loss with a drop in his weight from 207 down to 186.  Otherwise he denies any overt signs of bleeding, fevers, chills, sweats, nausea, or vomiting.  A full 10 point ROS is listed below.  MEDICAL HISTORY:  Past Medical History:  Diagnosis Date  . Diabetes mellitus without complication ([place])   . Hypertension     SURGICAL HISTORY: Past Surgical History:  Procedure Laterality Date  . CATARACT EXTRACTION, BILATERAL      SOCIAL HISTORY: Social History   Socioeconomic History  . Marital status: Married    Spouse name: Not on file  . Number of children: Not on file  . Years of education: Not on file  . Highest education level: Not on file  Occupational History  . Not on file  Tobacco Use  . Smoking status: Never Smoker  . Smokeless tobacco: Never Used  Substance and Sexual Activity  . Alcohol use: Yes  . Drug use: No  . Sexual activity: Not on file  Other Topics Concern  . Not on file  Social History Narrative  . Not on file   Social Determinants of Health   Financial Resource Strain:   . Difficulty of Paying Living Expenses: Not on file  Food Insecurity:   . Worried About Charity fundraiser in the Last Year: Not on file  . Ran Out of Food in the Last Year: Not on file  Transportation Needs:   .  Lack of Transportation (Medical): Not on file  . Lack of Transportation (Non-Medical): Not on file  Physical Activity:   . Days of Exercise per Week: Not on file  . Minutes of Exercise per Session: Not on file  Stress:   . Feeling of Stress : Not on file  Social Connections:   . Frequency of Communication with Friends and Family: Not on file  . Frequency of Social Gatherings with Friends and Family: Not on file  . Attends Religious Services: Not on file  . Active Member of Clubs or Organizations: Not on file  . Attends Theatre manager Meetings: Not on file  . Marital Status: Not on file  Intimate Partner Violence:   . Fear of Current or Ex-Partner: Not on file  . Emotionally Abused: Not on file  . Physically Abused: Not on file  . Sexually Abused: Not on file    FAMILY HISTORY: Family History  Problem Relation Age of Onset  . Mitral valve prolapse Mother   . Heart disease Father     ALLERGIES:  is allergic to levofloxacin and other.  MEDICATIONS:  Current Outpatient Medications  Medication Sig Dispense Refill  . Multiple Vitamin (MULTIVITAMIN) tablet Take 1 tablet by mouth daily.    Marland Kitchen amLODipine (NORVASC) 10 MG tablet Take 1 tablet (10 mg total) by mouth daily. 30 tablet 0  . aspirin 81 MG chewable tablet Chew 81 mg by mouth at bedtime.    Marland Kitchen glipiZIDE (GLUCOTROL) 10 MG tablet Take 10 mg by mouth 2 (two) times daily before a meal.    . rosuvastatin (CRESTOR) 10 MG tablet Take 10 mg by mouth once a week. Wednesday    . traMADol (ULTRAM) 50 MG tablet Take 50 mg by mouth 2 (two) times daily as needed for pain.     No current facility-administered medications for this visit.    REVIEW OF SYSTEMS:   Constitutional: ( - ) fevers, ( - )  chills , ( - ) night sweats Eyes: ( - ) blurriness of vision, ( - ) double vision, ( - ) watery eyes Ears, nose, mouth, throat, and face: ( - ) mucositis, ( - ) sore throat Respiratory: ( - ) cough, ( - ) dyspnea, ( - ) wheezes Cardiovascular: ( - ) palpitation, ( - ) chest discomfort, ( - ) lower extremity swelling Gastrointestinal:  ( - ) nausea, ( - ) heartburn, ( - ) change in bowel habits Skin: ( - ) abnormal skin rashes Lymphatics: ( - ) new lymphadenopathy, ( - ) easy bruising Neurological: ( - ) numbness, ( - ) tingling, ( - ) new weaknesses Behavioral/Psych: ( - ) mood change, ( - ) new changes  All other systems were reviewed with the patient and are negative.  PHYSICAL EXAMINATION: ECOG PERFORMANCE STATUS: 1 - Symptomatic but completely ambulatory   Vitals:   07/30/19 1005  BP: (!) 148/58  Pulse: 80  Resp: 18  Temp: 98.2 F (36.8 C)  SpO2: 100%   Filed Weights   07/30/19 1005  Weight: 186 lb (84.4 kg)    GENERAL: well appearing elderly Caucasian male in NAD  SKIN: skin color, texture, turgor are normal, no rashes or significant lesions EYES: conjunctiva are pink and non-injected, sclera clear LUNGS: clear to auscultation and percussion with normal breathing effort HEART: regular rate & rhythm and no murmurs and no lower extremity edema ABDOMEN: soft, non-tender, non-distended, normal bowel sounds Musculoskeletal: no cyanosis of digits and no clubbing  PSYCH: alert & oriented x 3, fluent speech NEURO: no focal motor/sensory deficits  LABORATORY DATA:  I have reviewed the data as listed Recent Results (from the past 2160 hour(s))  CBG monitoring, ED     Status: Abnormal   Collection Time: 06/29/19  7:46 PM  Result Value Ref Range   Glucose-Capillary 331 (H) 70 - 99 mg/dL  CBC     Status: Abnormal   Collection Time: 06/29/19  7:47 PM  Result Value Ref Range   WBC 15.3 (H) 4.0 - 10.5 K/uL   RBC 3.89 (L) 4.22 - 5.81 MIL/uL   Hemoglobin 13.7 13.0 - 17.0 g/dL   HCT 40.0 39.0 - 52.0 %   MCV 102.8 (H) 80.0 - 100.0 fL   MCH 35.2 (H) 26.0 - 34.0 pg   MCHC 34.3 30.0 - 36.0 g/dL   RDW 13.2 11.5 - 15.5 %   Platelets 182 150 - 400 K/uL   nRBC 0.0 0.0 - 0.2 %    Comment: Performed at [facility], [place] [address], [place], [place] [address]  Urinalysis, Routine w reflex microscopic     Status: Abnormal   Collection Time: 06/29/19  7:47 PM  Result Value Ref Range   Color, Urine YELLOW YELLOW   APPearance CLEAR CLEAR   Specific Gravity, Urine 1.011 1.005 - 1.030   pH 5.0 5.0 - 8.0   Glucose, UA 150 (A) NEGATIVE mg/dL   Hgb urine dipstick MODERATE (A) NEGATIVE   Bilirubin Urine NEGATIVE NEGATIVE   Ketones, ur NEGATIVE NEGATIVE mg/dL   Protein, ur 30 (A) NEGATIVE mg/dL   Nitrite NEGATIVE NEGATIVE    Leukocytes,Ua SMALL (A) NEGATIVE   RBC / HPF 0-5 0 - 5 RBC/hpf   WBC, UA 11-20 0 - 5 WBC/hpf   Bacteria, UA RARE (A) NONE SEEN    Comment: Performed at [facility], [place] [address] [place], [place] [address]  Comprehensive metabolic panel     Status: Abnormal   Collection Time: 06/29/19  8:02 PM  Result Value Ref Range   Sodium 136 135 - 145 mmol/L   Potassium 5.0 3.5 - 5.1 mmol/L   Chloride 102 98 - 111 mmol/L   CO2 18 (L) 22 - 32 mmol/L   Glucose, Bld 334 (H) 70 - 99 mg/dL   BUN 97 (H) 8 - 23 mg/dL    Comment: RESULTS CONFIRMED BY MANUAL DILUTION   Creatinine, Ser 4.90 (H) 0.61 - 1.24 mg/dL   Calcium 9.1 8.9 - 10.3 mg/dL   Total Protein 7.0 6.5 - 8.1 g/dL   Albumin 2.9 (L) 3.5 - 5.0 g/dL   AST 29 15 - 41 U/L   ALT 26 0 - 44 U/L   Alkaline Phosphatase 112 38 - 126 U/L   Total Bilirubin 1.4 (H) 0.3 - 1.2 mg/dL   GFR calc non Af Amer 11 (L) >60 mL/min   GFR calc Af Amer 13 (L) >60 mL/min   Anion gap 16 (H) 5 - 15    Comment: Performed at [facility], [place] [address] [place],  [address]  CBG monitoring, ED     Status: Abnormal   Collection Time: 06/29/19  8:06 PM  Result Value Ref Range   Glucose-Capillary 297 (H) 70 - 99 mg/dL  SARS CORONAVIRUS 2 (TAT 6-24 HRS) Nasopharyngeal Nasopharyngeal Swab     Status: None   Collection Time: 06/29/19 10:17 PM   Specimen: Nasopharyngeal Swab  Result Value Ref Range   SARS  Coronavirus 2 NEGATIVE NEGATIVE    Comment: (NOTE) SARS-CoV-2 target nucleic acids are NOT DETECTED. The SARS-CoV-2 RNA is generally detectable in upper and lower respiratory specimens during the acute phase of infection. Negative results do not preclude SARS-CoV-2 infection, do not rule out co-infections with other pathogens, and should not be used as the sole basis for treatment or other patient management decisions. Negative results must be combined with clinical observations, patient history, and epidemiological  information. The expected result is Negative. Fact Sheet for Patients: SugarRoll.be Fact Sheet for Healthcare Providers: https://www.woods-mathews.com/ This test is not yet approved or cleared by the [place] FDA and  has been authorized for detection and/or diagnosis of SARS-CoV-2 by FDA under an Emergency Use Authorization (EUA). This EUA will remain  in effect (meaning this test can be used) for the duration of the COVID-19 declaration under Section 56 4(b)(1) of the Act, 21 U.S.C. section 360bbb-3(b)(1), unless the authorization is terminated or revoked sooner. Performed at [facility], [place] [address] [place], [place] [address]   CBG monitoring, ED     Status: Abnormal   Collection Time: 06/29/19 11:31 PM  Result Value Ref Range   Glucose-Capillary 247 (H) 70 - 99 mg/dL  HIV Antibody (routine testing w rflx)     Status: None   Collection Time: 06/30/19  1:28 AM  Result Value Ref Range   HIV Screen 4th Generation wRfx NON REACTIVE NON REACTIVE    Comment: Performed at [facility], [place] [address], [place], [place] [address]  CBC     Status: Abnormal   Collection Time: 06/30/19  1:28 AM  Result Value Ref Range   WBC 12.9 (H) 4.0 - 10.5 K/uL   RBC 3.42 (L) 4.22 - 5.81 MIL/uL   Hemoglobin 11.9 (L) 13.0 - 17.0 g/dL   HCT 35.3 (L) 39.0 - 52.0 %   MCV 103.2 (H) 80.0 - 100.0 fL   MCH 34.8 (H) 26.0 - 34.0 pg   MCHC 33.7 30.0 - 36.0 g/dL   RDW 13.2 11.5 - 15.5 %   Platelets 157 150 - 400 K/uL   nRBC 0.0 0.0 - 0.2 %    Comment: Performed at [facility], [place] [address], [place], [place] [address]  Creatinine, serum     Status: Abnormal   Collection Time: 06/30/19  1:28 AM  Result Value Ref Range   Creatinine, Ser 5.06 (H) 0.61 - 1.24 mg/dL   GFR calc non Af Amer 11 (L) >60 mL/min   GFR calc Af Amer 12 (L) >60 mL/min    Comment: Performed at [facility], [place] [address]  [place], [place] [address]  Hemoglobin A1c     Status: Abnormal   Collection Time: 06/30/19  1:28 AM  Result Value Ref Range   Hgb A1c MFr Bld 7.9 (H) 4.8 - 5.6 %    Comment: (NOTE) Pre diabetes:          5.7%-6.4% Diabetes:              >6.4% Glycemic control for   <7.0% adults with diabetes    Mean Plasma Glucose 180.03 mg/dL    Comment: Performed at [place] [address] [place], [place] [address]  Basic metabolic panel     Status: Abnormal   Collection Time: 06/30/19  5:00 AM  Result Value Ref Range   Sodium 138 135 - 145 mmol/L   Potassium 4.0 3.5 - 5.1 mmol/L  Comment: DELTA CHECK NOTED   Chloride 105 98 - 111 mmol/L   CO2 19 (L) 22 - 32 mmol/L   Glucose, Bld 290 (H) 70 - 99 mg/dL   BUN 97 (H) 8 - 23 mg/dL   Creatinine, Ser 4.56 (H) 0.61 - 1.24 mg/dL   Calcium 8.3 (L) 8.9 - 10.3 mg/dL   GFR calc non Af Amer 12 (L) >60 mL/min   GFR calc Af Amer 14 (L) >60 mL/min   Anion gap 14 5 - 15    Comment: Performed at [facility], [place] [address] [place], [place] [address]  CBG monitoring, ED     Status: Abnormal   Collection Time: 06/30/19  5:05 AM  Result Value Ref Range   Glucose-Capillary 255 (H) 70 - 99 mg/dL  Glucose, capillary     Status: Abnormal   Collection Time: 07/02/19  1:16 PM  Result Value Ref Range   Glucose-Capillary 276 (H) 70 - 99 mg/dL  CBC with Differential     Status: Abnormal   Collection Time: 07/02/19  1:17 PM  Result Value Ref Range   WBC 13.2 (H) 4.0 - 10.5 K/uL   RBC 4.08 (L) 4.22 - 5.81 MIL/uL   Hemoglobin 14.2 13.0 - 17.0 g/dL   HCT 42.2 39.0 - 52.0 %   MCV 103.4 (H) 80.0 - 100.0 fL   MCH 34.8 (H) 26.0 - 34.0 pg   MCHC 33.6 30.0 - 36.0 g/dL   RDW 13.2 11.5 - 15.5 %   Platelets 291 150 - 400 K/uL   nRBC 0.0 0.0 - 0.2 %   Neutrophils Relative % 81 %   Neutro Abs 10.7 (H) 1.7 - 7.7 K/uL   Lymphocytes Relative 9 %   Lymphs Abs 1.2 0.7 - 4.0 K/uL   Monocytes Relative 9 %   Monocytes Absolute 1.2 (H) 0.1 - 1.0  K/uL   Eosinophils Relative 0 %   Eosinophils Absolute 0.0 0.0 - 0.5 K/uL   Basophils Relative 0 %   Basophils Absolute 0.0 0.0 - 0.1 K/uL   Immature Granulocytes 1 %   Abs Immature Granulocytes 0.15 (H) 0.00 - 0.07 K/uL    Comment: Performed at [facility], [place] [place], [place] [address]  Comprehensive metabolic panel     Status: Abnormal   Collection Time: 07/02/19  1:17 PM  Result Value Ref Range   Sodium 139 135 - 145 mmol/L   Potassium 4.8 3.5 - 5.1 mmol/L   Chloride 110 98 - 111 mmol/L   CO2 18 (L) 22 - 32 mmol/L   Glucose, Bld 297 (H) 70 - 99 mg/dL   BUN 76 (H) 8 - 23 mg/dL   Creatinine, Ser 3.98 (H) 0.61 - 1.24 mg/dL   Calcium 8.8 (L) 8.9 - 10.3 mg/dL   Total Protein 7.1 6.5 - 8.1 g/dL   Albumin 3.1 (L) 3.5 - 5.0 g/dL   AST 18 15 - 41 U/L   ALT 28 0 - 44 U/L   Alkaline Phosphatase 100 38 - 126 U/L   Total Bilirubin 0.9 0.3 - 1.2 mg/dL   GFR calc non Af Amer 14 (L) >60 mL/min   GFR calc Af Amer 17 (L) >60 mL/min   Anion gap 11 5 - 15    Comment: Performed at [facility], [place] [place], [place] [address]  Urinalysis, Routine w reflex microscopic     Status: Abnormal   Collection Time: 07/02/19  5:46 PM  Result  Value Ref Range   Color, Urine YELLOW (A) YELLOW   APPearance HAZY (A) CLEAR   Specific Gravity, Urine 1.008 1.005 - 1.030   pH 5.0 5.0 - 8.0   Glucose, UA >=500 (A) NEGATIVE mg/dL   Hgb urine dipstick MODERATE (A) NEGATIVE   Bilirubin Urine NEGATIVE NEGATIVE   Ketones, ur NEGATIVE NEGATIVE mg/dL   Protein, ur NEGATIVE NEGATIVE mg/dL   Nitrite NEGATIVE NEGATIVE   Leukocytes,Ua SMALL (A) NEGATIVE   RBC / HPF 0-5 0 - 5 RBC/hpf   WBC, UA 11-20 0 - 5 WBC/hpf   Bacteria, UA NONE SEEN NONE SEEN   Squamous Epithelial / LPF NONE SEEN 0 - 5   Mucus PRESENT     Comment: Performed at [facility], [address] [place], [place] [address]  Urine culture     Status: None   Collection Time: 07/02/19  5:46 PM    Specimen: Urine, Random  Result Value Ref Range   Specimen Description      URINE, RANDOM Performed at [facility], [address], [place], [place] [address]    Special Requests      NONE Performed at [facility], [address], [place], [place] [address]    Culture      NO GROWTH Performed at [facility], [place] [address], [place], [place] [address]    Report Status 07/04/2019 FINAL   Sodium, urine, random     Status: None   Collection Time: 07/02/19  5:46 PM  Result Value Ref Range   Sodium, Ur 41 mmol/L    Comment: Performed at [facility], [place] [place], [place] [address]  Creatinine, urine, random     Status: None   Collection Time: 07/02/19  5:46 PM  Result Value Ref Range   Creatinine, Urine 59 mg/dL    Comment: Performed at [facility], [place] [place], [place] [address]  Protein Electro, Random Urine     Status: None   Collection Time: 07/02/19  5:46 PM  Result Value Ref Range   Total Protein, Urine 15.6 Not Estab. mg/dL   Albumin ELP, Urine 53.0 %   Alpha-1-Globulin, U 4.9 %   Alpha-2-Globulin, U 18.2 %   Beta Globulin, U 19.2 %   Gamma Globulin, U 4.7 %   M Component, Ur Not Observed Not Observed %   Please Note: Comment     Comment: (NOTE) Protein electrophoresis scan will follow via computer, mail, or courier delivery. Performed At: [facility] [place], [place] [id number] Rush Farmer MD DJ:[id number]   Protein / creatinine ratio, urine     Status: Abnormal   Collection Time: 07/02/19  5:46 PM  Result Value Ref Range   Creatinine, Urine 52 mg/dL   Total Protein, Urine 20 mg/dL    Comment: NO NORMAL RANGE ESTABLISHED FOR THIS TEST   Protein Creatinine Ratio 0.38 (H) 0.00 - 0.15 mg/mg[Cre]    Comment: Performed at [facility], [place], [place] [address]  SARS CORONAVIRUS 2 (TAT 6-24 HRS) Nasopharyngeal Nasopharyngeal Swab     Status:  None   Collection Time: 07/02/19  6:26 PM   Specimen: Nasopharyngeal Swab  Result Value Ref Range   SARS Coronavirus 2 NEGATIVE NEGATIVE    Comment: (NOTE) SARS-CoV-2 target nucleic acids are NOT DETECTED. The SARS-CoV-2 RNA is generally detectable in upper and lower respiratory specimens during the acute phase of infection. Negative results do not  preclude SARS-CoV-2 infection, do not rule out co-infections with other pathogens, and should not be used as the sole basis for treatment or other patient management decisions. Negative results must be combined with clinical observations, patient history, and epidemiological information. The expected result is Negative. Fact Sheet for Patients: SugarRoll.be Fact Sheet for Healthcare Providers: https://www.woods-mathews.com/ This test is not yet approved or cleared by the [place] FDA and  has been authorized for detection and/or diagnosis of SARS-CoV-2 by FDA under an Emergency Use Authorization (EUA). This EUA will remain  in effect (meaning this test can be used) for the duration of the COVID-19 declaration under Section 56 4(b)(1) of the Act, 21 U.S.C. section 360bbb-3(b)(1), unless the authorization is terminated or revoked sooner. Performed at [facility], [place] [address] [place], [place] [address]   Glucose, capillary     Status: Abnormal   Collection Time: 07/02/19 11:40 PM  Result Value Ref Range   Glucose-Capillary 276 (H) 70 - 99 mg/dL  CBC     Status: Abnormal   Collection Time: 07/03/19  7:28 AM  Result Value Ref Range   WBC 11.8 (H) 4.0 - 10.5 K/uL   RBC 3.85 (L) 4.22 - 5.81 MIL/uL   Hemoglobin 13.4 13.0 - 17.0 g/dL   HCT 40.1 39.0 - 52.0 %   MCV 104.2 (H) 80.0 - 100.0 fL   MCH 34.8 (H) 26.0 - 34.0 pg   MCHC 33.4 30.0 - 36.0 g/dL   RDW 13.2 11.5 - 15.5 %   Platelets 254 150 - 400 K/uL   nRBC 0.0 0.0 - 0.2 %    Comment: Performed at [facility], [place] [place], [place] [address]  Basic metabolic panel     Status: Abnormal   Collection Time: 07/03/19  7:28 AM  Result Value Ref Range   Sodium 140 135 - 145 mmol/L   Potassium 5.4 (H) 3.5 - 5.1 mmol/L   Chloride 112 (H) 98 - 111 mmol/L   CO2 18 (L) 22 - 32 mmol/L   Glucose, Bld 301 (H) 70 - 99 mg/dL   BUN 70 (H) 8 - 23 mg/dL   Creatinine, Ser 3.71 (H) 0.61 - 1.24 mg/dL   Calcium 8.5 (L) 8.9 - 10.3 mg/dL   GFR calc non Af Amer 16 (L) >60 mL/min   GFR calc Af Amer 18 (L) >60 mL/min   Anion gap 10 5 - 15    Comment: Performed at [facility], [place] [place], [place] [address]  Glucose, capillary     Status: Abnormal   Collection Time: 07/03/19 10:24 AM  Result Value Ref Range   Glucose-Capillary 335 (H) 70 - 99 mg/dL  Glucose, capillary     Status: Abnormal   Collection Time: 07/03/19 12:01 PM  Result Value Ref Range   Glucose-Capillary 302 (H) 70 - 99 mg/dL   Comment 1 Notify RN   ANA w/Reflex if Positive     Status: None   Collection Time: 07/03/19 12:54 PM  Result Value Ref Range   Anti Nuclear Antibody (ANA) Negative Negative    Comment: (NOTE) Performed At: [facility] [place], [place] [id number] Rush Farmer MD YI:[id number]   ANCA titers     Status: None   Collection Time: 07/03/19 12:54 PM  Result Value Ref Range   C-ANCA <1:20 Neg:<1:20 titer   P-ANCA <1:20 Neg:<1:20 titer    Comment: (NOTE) The presence of positive fluorescence exhibiting P-ANCA or C-ANCA  patterns alone is not specific for the diagnosis of Wegener's Granulomatosis (WG) or microscopic polyangiitis. Decisions about treatment should not be based solely on ANCA IFA results.  The International ANCA Group Consensus recommends follow up testing of positive sera with both PR-3 and MPO-ANCA enzyme immunoassays. As many as 5% serum samples are positive only by EIA. Ref. AM J Clin Pathol 1999;111:507-513.    Atypical P-ANCA titer <1:20 Neg:<1:20 titer     Comment: (NOTE) The atypical pANCA pattern has been observed in a significant percentage of patients with ulcerative colitis, primary sclerosing cholangitis and autoimmune hepatitis. Performed At: [facility] [place], [place] [id number] Rush Farmer MD [id number]   Protein electrophoresis, serum     Status: Abnormal   Collection Time: 07/03/19 12:54 PM  Result Value Ref Range   Total Protein ELP 6.4 6.0 - 8.5 g/dL   Albumin ELP 2.7 (L) 2.9 - 4.4 g/dL   Alpha-1-Globulin 0.5 (H) 0.0 - 0.4 g/dL   Alpha-2-Globulin 1.1 (H) 0.4 - 1.0 g/dL   Beta Globulin 0.9 0.7 - 1.3 g/dL   Gamma Globulin 1.2 0.4 - 1.8 g/dL   M-Spike, % 0.4 (H) Not Observed g/dL   SPE Interp. Comment     Comment: (NOTE) Faint band in gamma region suspicious for monoclonal immunoglobulin. This band may represent a benign spike as seen in older people or could be a paraprotein as seen in Multiple Myeloma, Waldenstrom's Macroglobulinemia or Lymphoma. Depending on clinical circumstances, further diagnostic studies may include serum immunofixation or serum free light chain quantitation. Performed At: [facility] [place], [place] [id number] Rush Farmer MD YI:[id number]    Comment Comment     Comment: (NOTE) Protein electrophoresis scan will follow via computer, mail, or courier delivery.    Globulin, Total 3.7 2.2 - 3.9 g/dL   A/G Ratio 0.7 0.7 - 1.7  Kappa/lambda light chains     Status: Abnormal   Collection Time: 07/03/19 12:54 PM  Result Value Ref Range   Kappa free light chain 51.7 (H) 3.3 - 19.4 mg/L   Lamda free light chains 40.3 (H) 5.7 - 26.3 mg/L   Kappa, lamda light chain ratio 1.28 0.26 - 1.65    Comment: (NOTE) Performed At: [facility] [place], [place] [id number] Rush Farmer MD [id number]   C3 complement     Status: None   Collection Time: 07/03/19 12:54 PM  Result Value Ref Range   C3 Complement 157 82 -  167 mg/dL    Comment: (NOTE) Performed At: [facility] [place], [place] [id number] Rush Farmer MD [id number]   C4 complement     Status: None   Collection Time: 07/03/19 12:54 PM  Result Value Ref Range   Complement C4, Body Fluid 26 12 - 38 mg/dL    Comment: (NOTE) Performed At: [facility] [place], [place] [id number] Rush Farmer MD [id number]   Glomerular basement membrane antibodies     Status: None   Collection Time: 07/03/19 12:54 PM  Result Value Ref Range   GBM Ab 6 0 - 20 units    Comment: (NOTE)                   Negative                   0 - 20  Weak Positive             21 - 30                   Moderate to Strong Positive   >30 Performed At: [facility] [place], [place] [id number] Rush Farmer MD [id number]   Parathyroid hormone, intact (no Ca)     Status: None   Collection Time: 07/03/19 12:54 PM  Result Value Ref Range   PTH 24 15 - 65 pg/mL    Comment: (NOTE) Performed At: [facility] [place], [place] [id number] Rush Farmer MD [id number]   Hepatitis c antibody (reflex)     Status: None   Collection Time: 07/03/19 12:54 PM  Result Value Ref Range   HCV Ab <0.1 0.0 - 0.9 s/co ratio    Comment: (NOTE) Performed At: [facility] [place], [place] [id number] Rush Farmer MD [id number]   Hepatitis B surface antibody     Status: None   Collection Time: 07/03/19 12:54 PM  Result Value Ref Range   Hep B S Ab NON REACTIVE NON REACTIVE    Comment: (NOTE) Inconsistent with immunity, less than 10 mIU/mL. Performed at [facility], [place] [address], [place], [place] [address]   Hepatitis B surface antigen     Status: None   Collection Time: 07/03/19 12:54 PM  Result Value Ref Range   Hepatitis B Surface Ag NON REACTIVE NON REACTIVE    Comment: Performed at [facility] [address], [place], [place] [address]  HCV Comment:     Status: None   Collection Time: 07/03/19 12:54 PM  Result Value Ref Range   Comment: Comment     Comment: (NOTE) Non reactive HCV antibody screen is consistent with no HCV infection, unless recent infection is suspected or other evidence exists to indicate HCV infection. **Effective August 13, 2019 HCV Ab w/Rflx to Verification **  will be made non-orderable.  [facility] offers order code [id number] HCV Antibody reflex to  NAA. Performed At: [facility] [place], [place] [id number] Rush Farmer MD [id number]   Glucose, capillary     Status: Abnormal   Collection Time: 07/03/19  1:17 PM  Result Value Ref Range   Glucose-Capillary 184 (H) 70 - 99 mg/dL  Glucose, capillary     Status: None   Collection Time: 07/03/19  5:09 PM  Result Value Ref Range   Glucose-Capillary 97 70 - 99 mg/dL  Glucose, capillary     Status: Abnormal   Collection Time: 07/03/19  8:31 PM  Result Value Ref Range   Glucose-Capillary 148 (H) 70 - 99 mg/dL  Basic metabolic panel     Status: Abnormal   Collection Time: 07/04/19  4:02 AM  Result Value Ref Range   Sodium 140 135 - 145 mmol/L   Potassium 4.7 3.5 - 5.1 mmol/L   Chloride 112 (H) 98 - 111 mmol/L   CO2 15 (L) 22 - 32 mmol/L   Glucose, Bld 193 (H) 70 - 99 mg/dL   BUN 52 (H) 8 - 23 mg/dL   Creatinine, Ser 3.05 (H) 0.61 - 1.24 mg/dL   Calcium 8.1 (L) 8.9 - 10.3 mg/dL   GFR calc non Af Amer 20 (L) >60 mL/min   GFR calc Af Amer 23 (L) >60 mL/min   Anion gap 13 5 - 15    Comment: Performed at [facility], [address] [place], [place] [address]  CBC with Differential/Platelet     Status: Abnormal   Collection Time: 07/04/19  4:02 AM  Result Value Ref Range   WBC 9.9 4.0 - 10.5 K/uL   RBC 3.21 (L) 4.22 - 5.81 MIL/uL   Hemoglobin 11.0 (L) 13.0 - 17.0 g/dL   HCT 32.8 (L) 39.0 - 52.0 %   MCV 102.2 (H) 80.0 - 100.0 fL   MCH 34.3 (H) 26.0 - 34.0 pg   MCHC 33.5 30.0 -  36.0 g/dL   RDW 13.2 11.5 - 15.5 %   Platelets 241 150 - 400 K/uL   nRBC 0.0 0.0 - 0.2 %   Neutrophils Relative % 77 %   Neutro Abs 7.7 1.7 - 7.7 K/uL   Lymphocytes Relative 13 %   Lymphs Abs 1.3 0.7 - 4.0 K/uL   Monocytes Relative 8 %   Monocytes Absolute 0.8 0.1 - 1.0 K/uL   Eosinophils Relative 1 %   Eosinophils Absolute 0.1 0.0 - 0.5 K/uL   Basophils Relative 0 %   Basophils Absolute 0.0 0.0 - 0.1 K/uL   Immature Granulocytes 1 %   Abs Immature Granulocytes 0.10 (H) 0.00 - 0.07 K/uL    Comment: Performed at [facility], [address] [place], [place] [address]  Vitamin B12     Status: None   Collection Time: 07/04/19  4:02 AM  Result Value Ref Range   Vitamin B-12 357 180 - 914 pg/mL    Comment: (NOTE) This assay is not validated for testing neonatal or myeloproliferative syndrome specimens for Vitamin B12 levels. Performed at [facility], [place] [address] [place], [place] [address]   Folate     Status: None   Collection Time: 07/04/19  4:02 AM  Result Value Ref Range   Folate 18.5 >5.9 ng/mL    Comment: Performed at [facility], [place] [place], [place] [address]  Iron and TIBC     Status: Abnormal   Collection Time: 07/04/19  4:02 AM  Result Value Ref Range   Iron 55 45 - 182 ug/dL   TIBC 161 (L) 250 - 450 ug/dL   Saturation Ratios 34 17.9 - 39.5 %   UIBC 106 ug/dL    Comment: Performed at [facility], [place] [address] [place], [place] [address]  Ferritin     Status: Abnormal   Collection Time: 07/04/19  4:02 AM  Result Value Ref Range   Ferritin 425 (H) 24 - 336 ng/mL    Comment: Performed at [facility], [address] [place], [place] [address]  Glucose, capillary     Status: Abnormal   Collection Time: 07/04/19  7:40 AM  Result Value Ref Range   Glucose-Capillary 230 (H) 70 - 99 mg/dL   Comment 1 Notify RN   Glucose, capillary     Status: Abnormal   Collection Time: 07/04/19 11:38 AM  Result  Value Ref Range   Glucose-Capillary 235 (H) 70 - 99 mg/dL   Comment 1 Notify RN   Direct antiglobulin test ([facility])     Status: None   Collection Time: 07/30/19 11:18 AM  Result Value Ref Range   DAT, complement NEG    DAT, IgG      NEG Performed at [facility] [address], [place],  [address]   Homocysteine, serum     Status: Abnormal   Collection Time: 07/30/19 11:19 AM  Result Value Ref Range  Homocysteine 24.8 (H) 0.0 - 17.2 umol/L    Comment: (NOTE) Performed At: [facility] [place], [place] [id number] Rush Farmer MD [id number]   Kappa/lambda light chains     Status: Abnormal   Collection Time: 07/30/19 11:20 AM  Result Value Ref Range   Kappa free light chain 115.0 (H) 3.3 - 19.4 mg/L   Lamda free light chains 71.9 (H) 5.7 - 26.3 mg/L   Kappa, lamda light chain ratio 1.60 0.26 - 1.65    Comment: (NOTE) Performed At: [facility] [place], [place] [id number] Rush Farmer MD [id number]   Multiple Myeloma Panel (SPEP&IFE w/QIG)     Status: Abnormal   Collection Time: 07/30/19 11:20 AM  Result Value Ref Range   IgG (Immunoglobin G), Serum 1,367 603 - 1,613 mg/dL   IgA 203 61 - 437 mg/dL   IgM (Immunoglobulin M), Srm 158 20 - 172 mg/dL   Total Protein ELP 6.9 6.0 - 8.5 g/dL   Albumin SerPl Elph-Mcnc 2.8 (L) 2.9 - 4.4 g/dL   Alpha 1 0.5 (H) 0.0 - 0.4 g/dL   Alpha2 Glob SerPl Elph-Mcnc 1.2 (H) 0.4 - 1.0 g/dL   B-Globulin SerPl Elph-Mcnc 0.9 0.7 - 1.3 g/dL   Gamma Glob SerPl Elph-Mcnc 1.4 0.4 - 1.8 g/dL   M Protein SerPl Elph-Mcnc Not Observed Not Observed g/dL   Globulin, Total 4.1 (H) 2.2 - 3.9 g/dL   Albumin/Glob SerPl 0.7 0.7 - 1.7   IFE 1 Comment     Comment: (NOTE) The immunofixation pattern appears unremarkable. Evidence of monoclonal protein is not apparent.    Please Note Comment     Comment: (NOTE) Protein electrophoresis scan will follow via computer, mail, or  courier delivery. Performed At: [facility] [address] [place], [place] [id number] Rush Farmer MD [id number]   Haptoglobin     Status: Abnormal   Collection Time: 07/30/19 11:20 AM  Result Value Ref Range   Haptoglobin 512 (H) 32 - 363 mg/dL    Comment: (NOTE) Performed At: [facility] [place], [place] [id number] Rush Farmer MD [id number]   Folate, Serum     Status: None   Collection Time: 07/30/19 11:20 AM  Result Value Ref Range   Folate 12.6 >5.9 ng/mL    Comment: Performed at [facility], [place] [address], [place], [place] [address]  Vitamin B12     Status: None   Collection Time: 07/30/19 11:20 AM  Result Value Ref Range   Vitamin B-12 223 180 - 914 pg/mL    Comment: (NOTE) This assay is not validated for testing neonatal or myeloproliferative syndrome specimens for Vitamin B12 levels. Performed at [facility], [place] [address], [place], [place] [address]   Lactate dehydrogenase (LDH)     Status: None   Collection Time: 07/30/19 11:20 AM  Result Value Ref Range   LDH 105 98 - 192 U/L    Comment: Performed at [facility], [place] [address], [place], [place] [address]  CMP ([place] only)     Status: Abnormal   Collection Time: 07/30/19 11:20 AM  Result Value Ref Range   Sodium 130 (L) 135 - 145 mmol/L   Potassium 5.4 (H) 3.5 - 5.1 mmol/L   Chloride 99 98 - 111 mmol/L   CO2 16 (L) 22 - 32 mmol/L   Glucose, Bld 188 (H) 70 - 99 mg/dL   BUN 92 (H) 8 - 23 mg/dL  Creatinine 7.85 (HH) 0.61 - 1.24 mg/dL    Comment: CRITICAL RESULT CALLED TO, READ BACK BY AND VERIFIED WITH: KERR,M. MT AT 1359 07/30/19 MULLINS,T    Calcium 9.0 8.9 - 10.3 mg/dL   Total Protein 7.6 6.5 - 8.1 g/dL   Albumin 3.2 (L) 3.5 - 5.0 g/dL   AST 9 (L) 15 - 41 U/L   ALT 19 0 - 44 U/L   Alkaline Phosphatase 85 38 - 126 U/L   Total Bilirubin 0.9 0.3 - 1.2 mg/dL   GFR, Est Non Af Am 6 (L) >60  mL/min   GFR, Est AFR Am 7 (L) >60 mL/min   Anion gap 15 5 - 15    Comment: Performed at [facility], [place] [address], [place], [place] [address]  Save Smear [facility])     Status: None   Collection Time: 07/30/19 11:20 AM  Result Value Ref Range   Smear Review SMEAR STAINED AND AVAILABLE FOR REVIEW     Comment: Performed at [facility], [address] [place], [place] [address]  CBC with Differential ([place] Only)     Status: Abnormal   Collection Time: 07/30/19 11:20 AM  Result Value Ref Range   WBC Count 14.2 (H) 4.0 - 10.5 K/uL   RBC 2.99 (L) 4.22 - 5.81 MIL/uL   Hemoglobin 10.1 (L) 13.0 - 17.0 g/dL   HCT 30.5 (L) 39.0 - 52.0 %   MCV 102.0 (H) 80.0 - 100.0 fL   MCH 33.8 26.0 - 34.0 pg   MCHC 33.1 30.0 - 36.0 g/dL   RDW 12.5 11.5 - 15.5 %   Platelet Count 248 150 - 400 K/uL   nRBC 0.0 0.0 - 0.2 %   Neutrophils Relative % 84 %   Neutro Abs 11.9 (H) 1.7 - 7.7 K/uL   Lymphocytes Relative 9 %   Lymphs Abs 1.3 0.7 - 4.0 K/uL   Monocytes Relative 6 %   Monocytes Absolute 0.9 0.1 - 1.0 K/uL   Eosinophils Relative 0 %   Eosinophils Absolute 0.0 0.0 - 0.5 K/uL   Basophils Relative 0 %   Basophils Absolute 0.0 0.0 - 0.1 K/uL   Immature Granulocytes 1 %   Abs Immature Granulocytes 0.07 0.00 - 0.07 K/uL    Comment: Performed at [facility], [place] [address] [place], [place] [address]  TSH     Status: None   Collection Time: 07/30/19 11:21 AM  Result Value Ref Range   TSH 0.500 0.320 - 4.118 uIU/mL    Comment: Performed at [facility], [place] [address] [place], [place] [address]  Retic Panel     Status: Abnormal   Collection Time: 07/30/19 11:21 AM  Result Value Ref Range   Retic Ct Pct 1.0 0.4 - 3.1 %   RBC. 3.02 (L) 4.22 - 5.81 MIL/uL   Retic Count, Absolute 31.4 19.0 - 186.0 K/uL   Immature Retic Fract 13.8 2.3 - 15.9 %   Reticulocyte Hemoglobin 35.3 >27.9 pg    Comment:         Given the high negative predictive value of a RET-He result > 32 pg iron deficiency is essentially excluded. If this patient is anemic other etiologies should be considered. Performed at [facility], [place] [address] [place], [place] [address]     PATHOLOGY: None to review.   BLOOD FILM:  Review of the peripheral blood smear showed normal appearing white  cells with neutrophils that were appropriately lobated and granulated. There was some hyper-segmented neutrophils appreciated. No Dohle bodies were noted. There was some left shifting, but no immature forms or blasts noted. Lymphocytes remain normal in size without any predominance of large granular lymphocytes. Reactive lymphocytes were present. Red cells show no anisopoikilocytosis, macrocytes , microcytes or polychromasia. There were no schistocytes, target cells, echinocytes, acanthocytes, dacrocytes, or stomatocytes.There was no rouleaux formation, nucleated red cells, or intra-cellular inclusions noted. The platelets are normal in size, shape, and color without any clumping evident.  RADIOGRAPHIC STUDIES: I have personally reviewed the radiological images as listed and agreed with the findings in the report: renal lesion in the right lower kidney.  No results found.  ASSESSMENT & PLAN Austin Goodwin 69 y.o. 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  After review of the patient's labs and discussion with the patient he currently has numerous medical issues surrounding his acute kidney injury.  On initial review he does appear to have an MGUS with a monoclonal protein of 0.4 as noted on 07/03/2019.  He also has a marked AKI with a creatinine as high as 5.0 which improved to 3.0 with admission hydration.  Fortunately his serum free light chains though elevated are of normal ratio.  Today we will complete the  MGUS work-up including a bone survey UPEP LDH and repeat SPEP and serum free light chains.  Additionally the patient was noted to have hematuria and a subcentimeter cystic renal lesion on MRI.  I would recommend that this be evaluated by urology, though given it is subcentimeter it would be reasonable to follow with serial imaging.  I am concerned with the hematuria and AKI that potentially this lesion should be addressed.  Additionally his AKI is currently being followed by nephrology and his PCP.  The likelihood that this is being caused by a monoclonal gammopathy is low given his low M protein and normal serum free light chain ratio.  The most definitive route to diagnosis of his AKI would likely be a renal biopsy.  We will complete our work-up to determine if bone marrow biopsy is indicated for his MGUS.  Finally the patient is noted to have a macrocytosis and anemia which is not well explained by his AKI.  As such we will conduct nutritional studies and a lysis work-up to assure no other etiologies for this macrocytosis.  Given these findings we will have the patient follow-up in approximately 6 months, or sooner if labs or imaging indicates need for a bone marrow biopsy.  #Monoclonal Gammopathy of Undetermined Significance --today will repeat SPEP and SFLC. Additionally will order LDH and UPEP.  --will order bone survey. --no clear indication for bone marrow biopsy at this time. The M protein is mild and the SFLC ratio is normal --RTC in 6 months to reassess  #Renal Lesion --noted to be 86m on MRI, has a cystic appearance --recommend consideration of referral to urology given his hematuria. Defer this decision to nephrology who is currently caring for his AKI.  #Acute Kidney Injury, worsening --currently under the care of nephrology and his PCP --recommend considering other causes of AKI other than his MGUS. We are currently evaluating his MGUS to determine if bone marrow biopsy is merited.  Kidney biopsy would more directly yield the cause of his AKI.  #Macrocytic Anemia --today will order nutritional studies and lysis  labs  --likely component of AKI causing his anemia, though that would not explain the macrocytosis --continue to monitor  Orders Placed This Encounter  Procedures  . CBC with Differential ([facility] Only)    Standing Status:   Future    Number of Occurrences:   1    Standing Expiration Date:   07/29/2020  . Retic Panel    Standing Status:   Future    Number of Occurrences:   1    Standing Expiration Date:   07/29/2020  . Save Smear ([facility])    Standing Status:   Future    Number of Occurrences:   1    Standing Expiration Date:   07/29/2020  . CMP ([place] only)    Standing Status:   Future    Number of Occurrences:   1    Standing Expiration Date:   07/29/2020  . Lactate dehydrogenase (LDH)    Standing Status:   Future    Number of Occurrences:   1    Standing Expiration Date:   07/29/2020  . TSH    Standing Status:   Future    Number of Occurrences:   1    Standing Expiration Date:   07/29/2020  . Vitamin B12    Standing Status:   Future    Number of Occurrences:   1    Standing Expiration Date:   07/29/2020  . Folate, Serum    Standing Status:   Future    Number of Occurrences:   1    Standing Expiration Date:   07/29/2020  . Haptoglobin    Standing Status:   Future    Number of Occurrences:   1    Standing Expiration Date:   07/29/2020  . Methylmalonic acid, serum    Standing Status:   Future    Number of Occurrences:   1    Standing Expiration Date:   07/29/2020  . Multiple Myeloma Panel (SPEP&IFE w/QIG)    Standing Status:   Future    Number of Occurrences:   1    Standing Expiration Date:   07/29/2020  . Kappa/lambda light chains    Standing Status:   Future    Number of Occurrences:   1    Standing Expiration Date:   07/29/2020  . 24-Hr Ur UPEP/UIFE/Light Chains/TP    Standing Status:   Future    Standing Expiration Date:   07/29/2020  .  Homocysteine, serum    Standing Status:   Future    Number of Occurrences:   1    Standing Expiration Date:   07/29/2020  . Direct antiglobulin test ([facility])    Standing Status:   Future    Number of Occurrences:   1    Standing Expiration Date:   07/29/2020    All questions were answered. The patient knows to call the clinic with any problems, questions or concerns.  A total of more than 60 minutes were spent on this encounter and over half of that time was spent on counseling and coordination of care as outlined above.   Ledell Peoples, MD Department of Hematology/Oncology [place] at [facility] Phone: [phone] Pager: [phone] Email: [email or web address]  08/02/2019 7:00 PM

## 2019-07-30 NOTE — Progress Notes (Unsigned)
Critical result called Dr Lorenso Courier at 1415 by Prudencio Burly

## 2019-07-31 ENCOUNTER — Other Ambulatory Visit: Payer: Self-pay | Admitting: Nephrology

## 2019-07-31 DIAGNOSIS — N179 Acute kidney failure, unspecified: Secondary | ICD-10-CM

## 2019-07-31 DIAGNOSIS — N2889 Other specified disorders of kidney and ureter: Secondary | ICD-10-CM

## 2019-07-31 LAB — KAPPA/LAMBDA LIGHT CHAINS
Kappa free light chain: 115 mg/L — ABNORMAL HIGH (ref 3.3–19.4)
Kappa, lambda light chain ratio: 1.6 (ref 0.26–1.65)
Lambda free light chains: 71.9 mg/L — ABNORMAL HIGH (ref 5.7–26.3)

## 2019-07-31 LAB — HOMOCYSTEINE: Homocysteine: 24.8 umol/L — ABNORMAL HIGH (ref 0.0–17.2)

## 2019-07-31 LAB — HAPTOGLOBIN: Haptoglobin: 512 mg/dL — ABNORMAL HIGH (ref 32–363)

## 2019-07-31 NOTE — Progress Notes (Signed)
Patient on schedule for Renal Biopsy 08/03/2019, called and spoke with patient regarding time to arrive @0830 , NPO after [place] prior to procedure, and driver after procedure to home. Stated understanding.

## 2019-08-01 ENCOUNTER — Other Ambulatory Visit: Payer: Self-pay | Admitting: Radiology

## 2019-08-01 LAB — MULTIPLE MYELOMA PANEL, SERUM
Albumin SerPl Elph-Mcnc: 2.8 g/dL — ABNORMAL LOW (ref 2.9–4.4)
Albumin/Glob SerPl: 0.7 (ref 0.7–1.7)
Alpha 1: 0.5 g/dL — ABNORMAL HIGH (ref 0.0–0.4)
Alpha2 Glob SerPl Elph-Mcnc: 1.2 g/dL — ABNORMAL HIGH (ref 0.4–1.0)
B-Globulin SerPl Elph-Mcnc: 0.9 g/dL (ref 0.7–1.3)
Gamma Glob SerPl Elph-Mcnc: 1.4 g/dL (ref 0.4–1.8)
Globulin, Total: 4.1 g/dL — ABNORMAL HIGH (ref 2.2–3.9)
IgA: 203 mg/dL (ref 61–437)
IgG (Immunoglobin G), Serum: 1367 mg/dL (ref 603–1613)
IgM (Immunoglobulin M), Srm: 158 mg/dL (ref 20–172)
Total Protein ELP: 6.9 g/dL (ref 6.0–8.5)

## 2019-08-02 ENCOUNTER — Other Ambulatory Visit: Payer: Self-pay | Admitting: Student

## 2019-08-03 ENCOUNTER — Other Ambulatory Visit: Payer: Self-pay

## 2019-08-03 ENCOUNTER — Ambulatory Visit
Admission: RE | Admit: 2019-08-03 | Discharge: 2019-08-03 | Disposition: A | Discharge status: Home / Self Care | Payer: Medicare Other | Source: Ambulatory Visit | Attending: Nephrology | Admitting: Nephrology

## 2019-08-03 ENCOUNTER — Other Ambulatory Visit
Admission: RE | Admit: 2019-08-03 | Discharge: 2019-08-03 | Disposition: A | Discharge status: Home / Self Care | Payer: Medicare Other | Source: Ambulatory Visit | Attending: Hematology and Oncology | Admitting: Hematology and Oncology

## 2019-08-03 DIAGNOSIS — E119 Type 2 diabetes mellitus without complications: Secondary | ICD-10-CM | POA: Insufficient documentation

## 2019-08-03 DIAGNOSIS — I10 Essential (primary) hypertension: Secondary | ICD-10-CM | POA: Insufficient documentation

## 2019-08-03 DIAGNOSIS — Z79899 Other long term (current) drug therapy: Secondary | ICD-10-CM | POA: Insufficient documentation

## 2019-08-03 DIAGNOSIS — Z7984 Long term (current) use of oral hypoglycemic drugs: Secondary | ICD-10-CM | POA: Insufficient documentation

## 2019-08-03 DIAGNOSIS — N179 Acute kidney failure, unspecified: Secondary | ICD-10-CM

## 2019-08-03 DIAGNOSIS — Z7982 Long term (current) use of aspirin: Secondary | ICD-10-CM | POA: Diagnosis not present

## 2019-08-03 DIAGNOSIS — N10 Acute tubulo-interstitial nephritis: Secondary | ICD-10-CM | POA: Diagnosis not present

## 2019-08-03 DIAGNOSIS — N289 Disorder of kidney and ureter, unspecified: Secondary | ICD-10-CM | POA: Diagnosis not present

## 2019-08-03 LAB — PROTEIN, URINE, 24 HOUR
Collection Interval-UPROT: 24 hours
Protein, 24H Urine: 782 mg/d — ABNORMAL HIGH (ref 50–100)
Protein, Urine: 68 mg/dL
Urine Total Volume-UPROT: 1150 mL

## 2019-08-03 LAB — CBC
HCT: 31.6 % — ABNORMAL LOW (ref 39.0–52.0)
Hemoglobin: 10.4 g/dL — ABNORMAL LOW (ref 13.0–17.0)
MCH: 33.2 pg (ref 26.0–34.0)
MCHC: 32.9 g/dL (ref 30.0–36.0)
MCV: 101 fL — ABNORMAL HIGH (ref 80.0–100.0)
Platelets: 325 10*3/uL (ref 150–400)
RBC: 3.13 MIL/uL — ABNORMAL LOW (ref 4.22–5.81)
RDW: 12.7 % (ref 11.5–15.5)
WBC: 23.5 10*3/uL — ABNORMAL HIGH (ref 4.0–10.5)
nRBC: 0 % (ref 0.0–0.2)

## 2019-08-03 LAB — METHYLMALONIC ACID, SERUM: Methylmalonic Acid, Quantitative: 548 nmol/L — ABNORMAL HIGH (ref 0–378)

## 2019-08-03 LAB — PROTIME-INR
INR: 1.3 — ABNORMAL HIGH (ref 0.8–1.2)
Prothrombin Time: 15.6 seconds — ABNORMAL HIGH (ref 11.4–15.2)

## 2019-08-03 MED ORDER — FENTANYL CITRATE (PF) 100 MCG/2ML IJ SOLN
INTRAMUSCULAR | Status: AC
  Filled 2019-08-03: qty 2

## 2019-08-03 MED ORDER — MIDAZOLAM HCL 5 MG/5ML IJ SOLN
INTRAMUSCULAR | Status: AC
  Filled 2019-08-03: qty 5

## 2019-08-03 MED ORDER — SODIUM CHLORIDE 0.9 % IV SOLN: INTRAVENOUS | Status: DC

## 2019-08-03 MED ORDER — MIDAZOLAM HCL 2 MG/2ML IJ SOLN
INTRAMUSCULAR | Status: AC | PRN
  Administered 2019-08-03 (×2): 1 mg via INTRAVENOUS

## 2019-08-03 MED ORDER — FENTANYL CITRATE (PF) 100 MCG/2ML IJ SOLN
INTRAMUSCULAR | Status: AC | PRN
  Administered 2019-08-03 (×2): 50 ug via INTRAVENOUS

## 2019-08-03 NOTE — Procedures (Signed)
Pre Procedure Dx: Renal Insufficiency Post Procedural Dx: Same  Technically successful US guided biopsy of inferior pole of the right kidney.  EBL: None No immediate complications.   Ronny Bacon, MD Pager #: [phone]

## 2019-08-03 NOTE — Consult Note (Signed)
Chief Complaint: Patient was seen in consultation today for No chief complaint on file.  at the request of Singh,Harmeet  Referring Physician(s): Singh,Harmeet  Patient Status: [place] - Out-pt  History of Present Illness: Austin Goodwin is a 69 y.o. male with past medical history significant for diabetes and hypertension who presents today for ultrasound-guided renal biopsy due to renal insufficiency/impending renal failure.  Patient admits to decreased appetite and energy level though is otherwise without complaint. Specifically, no chest pain or shortness of breath. No swelling of the legs or extremities. No cough, fever or chills.  Past Medical History:  Diagnosis Date  . Diabetes mellitus without complication ([place])   . Hypertension     Past Surgical History:  Procedure Laterality Date  . CATARACT EXTRACTION, BILATERAL      Allergies: Levofloxacin and Other  Medications: Prior to Admission medications   Medication Sig Start Date End Date Taking? Authorizing Provider  amLODipine (NORVASC) 10 MG tablet Take 1 tablet (10 mg total) by mouth daily. 07/04/19  Yes Jennye Boroughs, MD  aspirin 81 MG chewable tablet Chew 81 mg by mouth at bedtime.   Yes [provider]  glipiZIDE (GLUCOTROL) 10 MG tablet Take 10 mg by mouth 2 (two) times daily before a meal.   Yes [provider]  Multiple Vitamin (MULTIVITAMIN) tablet Take 1 tablet by mouth daily.   Yes [provider]  rosuvastatin (CRESTOR) 10 MG tablet Take 10 mg by mouth once a week. Wednesday   Yes [provider]  traMADol (ULTRAM) 50 MG tablet Take 50 mg by mouth 2 (two) times daily as needed for pain. 06/18/19  Yes [provider]     Family History  Problem Relation Age of Onset  . Mitral valve prolapse Mother   . Heart disease Father     Social History   Socioeconomic History  . Marital status: Married    Spouse name: Not on file  . Number of children: Not on  file  . Years of education: Not on file  . Highest education level: Not on file  Occupational History  . Not on file  Tobacco Use  . Smoking status: Never Smoker  . Smokeless tobacco: Never Used  Substance and Sexual Activity  . Alcohol use: Yes  . Drug use: No  . Sexual activity: Not on file  Other Topics Concern  . Not on file  Social History Narrative  . Not on file   Social Determinants of Health   Financial Resource Strain:   . Difficulty of Paying Living Expenses: Not on file  Food Insecurity:   . Worried About Charity fundraiser in the Last Year: Not on file  . Ran Out of Food in the Last Year: Not on file  Transportation Needs:   . Lack of Transportation (Medical): Not on file  . Lack of Transportation (Non-Medical): Not on file  Physical Activity:   . Days of Exercise per Week: Not on file  . Minutes of Exercise per Session: Not on file  Stress:   . Feeling of Stress : Not on file  Social Connections:   . Frequency of Communication with Friends and Family: Not on file  . Frequency of Social Gatherings with Friends and Family: Not on file  . Attends Religious Services: Not on file  . Active Member of Clubs or Organizations: Not on file  . Attends Archivist Meetings: Not on file  . Marital Status: Not on file  ECOG Status: 1 - Symptomatic but completely ambulatory  Review of Systems: A 12 point ROS discussed and pertinent positives are indicated in the HPI above.  All other systems are negative.  Review of Systems  Vital Signs: BP 137/68   Pulse 96   Resp 20   Ht 6' (1.829 m)   Wt 83.9 kg   SpO2 100%   BMI 25.09 kg/m   Physical Exam  Imaging:  Abdominal MRI-07/03/2019; renal ultrasound-07/03/2019  Labs:  CBC: Recent Labs    07/03/19 0728 07/04/19 0402 07/30/19 1120 08/03/19 0847  WBC 11.8* 9.9 14.2* 23.5*  HGB 13.4 11.0* 10.1* 10.4*  HCT 40.1 32.8* 30.5* 31.6*  PLT 254 241 248 325    COAGS: Recent Labs     08/03/19 0847  INR 1.3*    BMP: Recent Labs    07/02/19 1317 07/03/19 0728 07/04/19 0402 07/30/19 1120  NA 139 140 140 130*  K 4.8 5.4* 4.7 5.4*  CL 110 112* 112* 99  CO2 18* 18* 15* 16*  GLUCOSE 297* 301* 193* 188*  BUN 76* 70* 52* 92*  CALCIUM 8.8* 8.5* 8.1* 9.0  CREATININE 3.98* 3.71* 3.05* 7.85*  GFRNONAA 14* 16* 20* 6*  GFRAA 17* 18* 23* 7*    LIVER FUNCTION TESTS: Recent Labs    06/29/19 2002 07/02/19 1317 07/30/19 1120  BILITOT 1.4* 0.9 0.9  AST 29 18 9*  ALT 26 28 19   ALKPHOS 112 100 85  PROT 7.0 7.1 7.6  ALBUMIN 2.9* 3.1* 3.2*    TUMOR MARKERS: No results for input(s): AFPTM, CEA, CA199, CHROMGRNA in the last 8760 hours.  Assessment and Plan:  KIEGAN MACARAEG is a 69 y.o. male with past medical history significant for diabetes and hypertension who presents today for ultrasound-guided renal biopsy due to renal insufficiency/impending renal failure.  Risks and benefits of ultrasound-guided renal biopsy was discussed with the patient and/or patient's family including, but not limited to bleeding, infection, damage to adjacent structures or low yield requiring additional tests.  All of the questions were answered and there is agreement to proceed.  Consent signed and in chart.  Thank you for this interesting consult.  I greatly enjoyed meeting DAMYAN CORNE and look forward to participating in their care.  A copy of this report was sent to the requesting provider on this date.  Electronically Signed: Sandi Mariscal, MD 08/03/2019, 9:17 AM   I spent a total of 15 Minutes in face to face in clinical consultation, greater than 50% of which was counseling/coordinating care for ultrasound-guided renal biopsy.

## 2019-08-03 NOTE — Discharge Instructions (Signed)
Percutaneous Kidney Biopsy, Care After This sheet gives you information about how to care for yourself after your procedure. Your health care provider may also give you more specific instructions. If you have problems or questions, contact your health care provider. What can I expect after the procedure? After the procedure, it is common to have:  Pain or soreness near the biopsy site.  Pink or cloudy urine for 24 hours after the procedure. Follow these instructions at home: Activity  Return to your normal activities as told by your health care provider. Ask your health care provider what activities are safe for you.  If you were given a sedative during the procedure, it can affect you for several hours. Do not drive or operate machinery until your health care provider says that it is safe.  Do not lift anything that is heavier than 10 lb (4.5 kg), or the limit that you are told, until your health care provider says that it is safe.  Avoid activities that take a lot of effort (are strenuous) until your health care provider approves. Most people will have to wait 2 weeks before returning to activities such as exercise or sex. General instructions   Take over-the-counter and prescription medicines only as told by your health care provider.  You may eat and drink after your procedure. Follow instructions from your health care provider about eating or drinking restrictions.  Check your biopsy site every day for signs of infection. Check for: ? More redness, swelling, or pain. ? Fluid or blood. ? Warmth. ? Pus or a bad smell.  Keep all follow-up visits as told by your health care provider. This is important. Contact a health care provider if:  You have more redness, swelling, or pain around your biopsy site.  You have fluid or blood coming from your biopsy site.  Your biopsy site feels warm to the touch.  You have pus or a bad smell coming from your biopsy site.  You have blood  in your urine more than 24 hours after your procedure.  You have a fever. Get help right away if:  Your urine is dark red or brown.  You cannot urinate.  It burns when you urinate.  You feel dizzy or light-headed.  You have severe pain in your abdomen or side. Summary  After the procedure, it is common to have pain or soreness at the biopsy site and pink or cloudy urine for the first 24 hours.  Check your biopsy site each day for signs of infection, such as more redness, swelling, or pain; fluid, blood, pus or a bad smell coming from the biopsy site; or the biopsy site feeling warm to touch.  Return to your normal activities as told by your health care provider. This information is not intended to replace advice given to you by your health care provider. Make sure you discuss any questions you have with your health care provider. Document Revised: 03/15/2019 Document Reviewed: 03/15/2019 Elsevier Patient Education  2020 Elsevier Inc.    Moderate Conscious Sedation, Adult, Care After These instructions provide you with information about caring for yourself after your procedure. Your health care provider may also give you more specific instructions. Your treatment has been planned according to current medical practices, but problems sometimes occur. Call your health care provider if you have any problems or questions after your procedure. What can I expect after the procedure? After your procedure, it is common:  To feel sleepy for several hours.  To   feel clumsy and have poor balance for several hours.  To have poor judgment for several hours.  To vomit if you eat too soon. Follow these instructions at home: For at least 24 hours after the procedure:   Do not: ? Participate in activities where you could fall or become injured. ? Drive. ? Use heavy machinery. ? Drink alcohol. ? Take sleeping pills or medicines that cause drowsiness. ? Make important decisions or sign  legal documents. ? Take care of children on your own.  Rest. Eating and drinking  Follow the diet recommended by your health care provider.  If you vomit: ? Drink water, juice, or soup when you can drink without vomiting. ? Make sure you have little or no nausea before eating solid foods. General instructions  Have a responsible adult stay with you until you are awake and alert.  Take over-the-counter and prescription medicines only as told by your health care provider.  If you smoke, do not smoke without supervision.  Keep all follow-up visits as told by your health care provider. This is important. Contact a health care provider if:  You keep feeling nauseous or you keep vomiting.  You feel light-headed.  You develop a rash.  You have a fever. Get help right away if:  You have trouble breathing. This information is not intended to replace advice given to you by your health care provider. Make sure you discuss any questions you have with your health care provider. Document Revised: 06/24/2017 Document Reviewed: 11/01/2015 Elsevier Patient Education  2020 Elsevier Inc.  

## 2019-08-03 NOTE — Progress Notes (Signed)
Patient clinically stable post Renal biopsy per Dr Pascal Lux, tolerated well with vitals stable throughout. Awake/alert and oriented post procedure. Spoke with wife regarding procedure with questions answered. Received Versed 2mg  along with Fentanyl 154mcg IV for procedure. Report given to [facility] RN with questions answered.

## 2019-08-06 LAB — IFE AND PE, RANDOM URINE
% BETA, Urine: 19.9 %
ALPHA 1 URINE: 3.3 %
Albumin, U: 42.6 %
Alpha 2, Urine: 12 %
GAMMA GLOBULIN URINE: 22.2 %
Total Protein, Urine: 55.6 mg/dL

## 2019-08-07 ENCOUNTER — Other Ambulatory Visit: Payer: Self-pay | Admitting: Nephrology

## 2019-08-07 DIAGNOSIS — N179 Acute kidney failure, unspecified: Secondary | ICD-10-CM

## 2019-08-08 ENCOUNTER — Other Ambulatory Visit
Admission: RE | Admit: 2019-08-08 | Discharge: 2019-08-08 | Disposition: A | Discharge status: Home / Self Care | Payer: Medicare Other | Source: Ambulatory Visit | Attending: Vascular Surgery | Admitting: Vascular Surgery

## 2019-08-08 ENCOUNTER — Other Ambulatory Visit: Payer: Self-pay | Admitting: Hematology and Oncology

## 2019-08-08 ENCOUNTER — Telehealth: Payer: Self-pay | Admitting: Hematology and Oncology

## 2019-08-08 ENCOUNTER — Telehealth (INDEPENDENT_AMBULATORY_CARE_PROVIDER_SITE_OTHER): Payer: Self-pay

## 2019-08-08 ENCOUNTER — Other Ambulatory Visit: Payer: Self-pay

## 2019-08-08 DIAGNOSIS — Z20822 Contact with and (suspected) exposure to covid-19: Secondary | ICD-10-CM | POA: Diagnosis not present

## 2019-08-08 DIAGNOSIS — Z01812 Encounter for preprocedural laboratory examination: Secondary | ICD-10-CM | POA: Insufficient documentation

## 2019-08-08 MED ORDER — VITAMIN B-12 1000 MCG PO TABS: 1000.0000 ug | ORAL_TABLET | Freq: Every day | ORAL | 1 refills | Status: DC

## 2019-08-08 NOTE — Telephone Encounter (Signed)
Called Mr. Lofquist to discuss the results of his blood work from the prior week.  I spoke with both the patient and his wife regarding the findings.  It looks at this time as though he has a vitamin B12 deficiency.  This would explain his mild macrocytic anemia.  In the meantime I will recommend him taking 1000 mcg of vitamin B12 daily.  After further review of the multiple myeloma labs I do not believe that monoclonal protein is associated with his rapid decline in kidney function.  He has had a kidney biopsy performed last Friday which would confirm if he has any form of amyloidosis or myelomatous buildup, though I do find this to be markedly unlikely.  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  In the unlikely event that his kidney dysfunction was found to be related to amyloidosis or multiple myeloma please have him referred back to our clinic as soon as possible for discussion of treatment.  Ledell Peoples, MD Department of Hematology/Oncology [place] at [facility] Phone: [phone] Pager: [phone] Email: [email or web address]

## 2019-08-08 NOTE — Telephone Encounter (Signed)
Spoke with the patient's wife and let her know the patient is scheduled for a permcath insertion on 08/10/19 with Dr. Lucky Cowboy with a 11:45 am arrival time to the [facility]. Patient will do covid testing today 08/08/19 between 12:30-2:30 pm at the [place]. Pre-procedure instructions were discussed with the patient's wife.

## 2019-08-09 DIAGNOSIS — N179 Acute kidney failure, unspecified: Secondary | ICD-10-CM | POA: Diagnosis not present

## 2019-08-09 DIAGNOSIS — I10 Essential (primary) hypertension: Secondary | ICD-10-CM | POA: Diagnosis not present

## 2019-08-09 DIAGNOSIS — D472 Monoclonal gammopathy: Secondary | ICD-10-CM | POA: Diagnosis not present

## 2019-08-09 LAB — SARS CORONAVIRUS 2 (TAT 6-24 HRS): SARS Coronavirus 2: NEGATIVE

## 2019-08-10 ENCOUNTER — Other Ambulatory Visit: Payer: Self-pay

## 2019-08-10 ENCOUNTER — Other Ambulatory Visit (INDEPENDENT_AMBULATORY_CARE_PROVIDER_SITE_OTHER): Payer: Self-pay | Admitting: Nurse Practitioner

## 2019-08-10 ENCOUNTER — Encounter: Payer: Self-pay | Admitting: Vascular Surgery

## 2019-08-10 ENCOUNTER — Encounter: Payer: Self-pay | Admitting: Nephrology

## 2019-08-10 ENCOUNTER — Ambulatory Visit
Admission: RE | Admit: 2019-08-10 | Discharge: 2019-08-10 | Disposition: A | Discharge status: Home / Self Care | Payer: Medicare Other | Attending: Vascular Surgery | Admitting: Vascular Surgery

## 2019-08-10 ENCOUNTER — Encounter
Admission: RE | Disposition: A | Discharge status: Home / Self Care | Payer: Self-pay | Source: Home / Self Care | Attending: Vascular Surgery

## 2019-08-10 ENCOUNTER — Other Ambulatory Visit (INDEPENDENT_AMBULATORY_CARE_PROVIDER_SITE_OTHER): Payer: Self-pay | Admitting: Vascular Surgery

## 2019-08-10 DIAGNOSIS — Z7982 Long term (current) use of aspirin: Secondary | ICD-10-CM | POA: Insufficient documentation

## 2019-08-10 DIAGNOSIS — I129 Hypertensive chronic kidney disease with stage 1 through stage 4 chronic kidney disease, or unspecified chronic kidney disease: Secondary | ICD-10-CM | POA: Diagnosis not present

## 2019-08-10 DIAGNOSIS — N185 Chronic kidney disease, stage 5: Secondary | ICD-10-CM | POA: Diagnosis not present

## 2019-08-10 DIAGNOSIS — N186 End stage renal disease: Secondary | ICD-10-CM

## 2019-08-10 DIAGNOSIS — N189 Chronic kidney disease, unspecified: Secondary | ICD-10-CM | POA: Insufficient documentation

## 2019-08-10 DIAGNOSIS — Z7984 Long term (current) use of oral hypoglycemic drugs: Secondary | ICD-10-CM | POA: Insufficient documentation

## 2019-08-10 DIAGNOSIS — E1122 Type 2 diabetes mellitus with diabetic chronic kidney disease: Secondary | ICD-10-CM | POA: Insufficient documentation

## 2019-08-10 DIAGNOSIS — Z79899 Other long term (current) drug therapy: Secondary | ICD-10-CM | POA: Diagnosis not present

## 2019-08-10 HISTORY — PX: DIALYSIS/PERMA CATHETER INSERTION: CATH118288

## 2019-08-10 LAB — SURGICAL PATHOLOGY

## 2019-08-10 LAB — GLUCOSE, CAPILLARY: Glucose-Capillary: 125 mg/dL — ABNORMAL HIGH (ref 70–99)

## 2019-08-10 SURGERY — DIALYSIS/PERMA CATHETER INSERTION
Anesthesia: Moderate Sedation

## 2019-08-10 MED ORDER — MIDAZOLAM HCL 5 MG/5ML IJ SOLN
INTRAMUSCULAR | Status: AC
  Filled 2019-08-10: qty 5

## 2019-08-10 MED ORDER — MIDAZOLAM HCL 2 MG/2ML IJ SOLN
INTRAMUSCULAR | Status: DC | PRN
  Administered 2019-08-10: 2 mg via INTRAVENOUS

## 2019-08-10 MED ORDER — MIDAZOLAM HCL 2 MG/ML PO SYRP: 8.0000 mg | ORAL_SOLUTION | Freq: Once | ORAL | Status: DC | PRN

## 2019-08-10 MED ORDER — SODIUM CHLORIDE 0.9 % IV SOLN: INTRAVENOUS | Status: DC

## 2019-08-10 MED ORDER — FENTANYL CITRATE (PF) 100 MCG/2ML IJ SOLN
INTRAMUSCULAR | Status: DC | PRN
  Administered 2019-08-10: 50 ug via INTRAVENOUS

## 2019-08-10 MED ORDER — METHYLPREDNISOLONE SODIUM SUCC 125 MG IJ SOLR: 125.0000 mg | Freq: Once | INTRAMUSCULAR | Status: DC | PRN

## 2019-08-10 MED ORDER — DIPHENHYDRAMINE HCL 50 MG/ML IJ SOLN: 50.0000 mg | Freq: Once | INTRAMUSCULAR | Status: DC | PRN

## 2019-08-10 MED ORDER — HYDROMORPHONE HCL 1 MG/ML IJ SOLN: 1.0000 mg | Freq: Once | INTRAMUSCULAR | Status: DC | PRN

## 2019-08-10 MED ORDER — FAMOTIDINE 20 MG PO TABS: 40.0000 mg | ORAL_TABLET | Freq: Once | ORAL | Status: DC | PRN

## 2019-08-10 MED ORDER — FENTANYL CITRATE (PF) 100 MCG/2ML IJ SOLN
INTRAMUSCULAR | Status: AC
  Filled 2019-08-10: qty 2

## 2019-08-10 MED ORDER — CEFAZOLIN SODIUM-DEXTROSE 1-4 GM/50ML-% IV SOLN: 1.0000 g | Freq: Once | INTRAVENOUS | Status: AC

## 2019-08-10 MED ORDER — ONDANSETRON HCL 4 MG/2ML IJ SOLN: 4.0000 mg | Freq: Four times a day (QID) | INTRAMUSCULAR | Status: DC | PRN

## 2019-08-10 MED ORDER — CEFAZOLIN SODIUM-DEXTROSE 1-4 GM/50ML-% IV SOLN
INTRAVENOUS | Status: AC
  Administered 2019-08-10: 1 g via INTRAVENOUS
  Filled 2019-08-10: qty 50

## 2019-08-10 SURGICAL SUPPLY — 6 items
CATH PALINDROME RT-P 15FX19CM (CATHETERS) ×1 IMPLANT
DERMABOND ADVANCED (GAUZE/BANDAGES/DRESSINGS) ×1
DERMABOND ADVANCED .7 DNX12 (GAUZE/BANDAGES/DRESSINGS) IMPLANT
PACK ANGIOGRAPHY (CUSTOM PROCEDURE TRAY) ×1 IMPLANT
SUT MNCRL AB 4-0 PS2 18 (SUTURE) ×1 IMPLANT
SUT PROLENE 0 CT 1 30 (SUTURE) ×1 IMPLANT

## 2019-08-10 NOTE — H&P (Signed)
[facility] History & Physical Update  The patient was interviewed and re-examined.  The patient's previous History and Physical has been reviewed and is unchanged.  There is no change in the plan of care. We plan to proceed with the scheduled procedure.  Leotis Pain, MD  08/10/2019, 12:39 PM

## 2019-08-10 NOTE — Op Note (Signed)
OPERATIVE NOTE    PRE-OPERATIVE DIAGNOSIS: 1. ESRD   POST-OPERATIVE DIAGNOSIS: same as above  PROCEDURE: 1. Ultrasound guidance for vascular access to the right internal jugular vein 2. Fluoroscopic guidance for placement of catheter 3. Placement of a 19 cm tip to cuff tunneled hemodialysis catheter via the right internal jugular vein  SURGEON: Leotis Pain, MD  ANESTHESIA:  Local with Moderate conscious sedation for approximately 15 minutes using 2 mg of Versed and 50 mcg of Fentanyl  ESTIMATED BLOOD LOSS: 3 cc  FLUORO TIME: less than one minute  CONTRAST: none  FINDING(S): 1.  Patent right internal jugular vein  SPECIMEN(S):  None  INDICATIONS:   Austin Goodwin is a 69 y.o.male who presents with renal failure.  The patient needs long term dialysis access for their ESRD, and a Permcath is necessary.  Risks and benefits are discussed and informed consent is obtained.    DESCRIPTION: After obtaining full informed written consent, the patient was brought back to the vascular suited. The patient's right neck and chest were sterilely prepped and draped in a sterile surgical field was created. Moderate conscious sedation was administered during a face to face encounter with the patient throughout the procedure with my supervision of the RN administering medicines and monitoring the patient's vital signs, pulse oximetry, telemetry and mental status throughout from the start of the procedure until the patient was taken to the recovery room.  The right internal jugular vein was visualized with ultrasound and found to be patent. It was then accessed under direct ultrasound guidance and a permanent image was recorded. A wire was placed. After skin nick and dilatation, the peel-away sheath was placed over the wire. I then turned my attention to an area under the clavicle. Approximately 1-2 fingerbreadths below the clavicle a small counterincision was created and tunneled from the subclavicular  incision to the access site. Using fluoroscopic guidance, a 19 centimeter tip to cuff tunneled hemodialysis catheter was selected, and tunneled from the subclavicular incision to the access site. It was then placed through the peel-away sheath and the peel-away sheath was removed. Using fluoroscopic guidance the catheter tips were parked in the right atrium. The appropriate distal connectors were placed. It withdrew blood well and flushed easily with heparinized saline and a concentrated heparin solution was then placed. It was secured to the chest wall with 2 Prolene sutures. The access incision was closed single 4-0 Monocryl. A 4-0 Monocryl pursestring suture was placed around the exit site. Sterile dressings were placed. The patient tolerated the procedure well and was taken to the recovery room in stable condition.  COMPLICATIONS: None  CONDITION: Stable  Leotis Pain, MD 08/10/2019 1:30 PM   This note was created with Dragon Medical transcription system. Any errors in dictation are purely unintentional.

## 2019-08-12 ENCOUNTER — Other Ambulatory Visit: Payer: Self-pay | Admitting: Nephrology

## 2019-08-12 DIAGNOSIS — N179 Acute kidney failure, unspecified: Secondary | ICD-10-CM

## 2019-08-13 ENCOUNTER — Encounter: Payer: Self-pay | Admitting: Cardiology

## 2019-08-13 ENCOUNTER — Ambulatory Visit
Admission: RE | Admit: 2019-08-13 | Discharge: 2019-08-13 | Disposition: A | Discharge status: Home / Self Care | Payer: Medicare Other | Source: Ambulatory Visit | Attending: Nephrology | Admitting: Nephrology

## 2019-08-13 ENCOUNTER — Other Ambulatory Visit: Payer: Self-pay

## 2019-08-13 ENCOUNTER — Other Ambulatory Visit
Admission: RE | Admit: 2019-08-13 | Discharge: 2019-08-13 | Disposition: A | Discharge status: Home / Self Care | Payer: Medicare Other | Source: Ambulatory Visit | Attending: Nephrology | Admitting: Nephrology

## 2019-08-13 ENCOUNTER — Telehealth (INDEPENDENT_AMBULATORY_CARE_PROVIDER_SITE_OTHER): Payer: Self-pay

## 2019-08-13 DIAGNOSIS — N179 Acute kidney failure, unspecified: Secondary | ICD-10-CM

## 2019-08-13 DIAGNOSIS — N289 Disorder of kidney and ureter, unspecified: Secondary | ICD-10-CM | POA: Diagnosis not present

## 2019-08-13 LAB — RENAL FUNCTION PANEL
Albumin: 3 g/dL — ABNORMAL LOW (ref 3.5–5.0)
Anion gap: 16 — ABNORMAL HIGH (ref 5–15)
BUN: 138 mg/dL — ABNORMAL HIGH (ref 8–23)
CO2: 11 mmol/L — ABNORMAL LOW (ref 22–32)
Calcium: 8.6 mg/dL — ABNORMAL LOW (ref 8.9–10.3)
Chloride: 102 mmol/L (ref 98–111)
Creatinine, Ser: 14.66 mg/dL — ABNORMAL HIGH (ref 0.61–1.24)
GFR calc Af Amer: 3 mL/min — ABNORMAL LOW (ref >60)
GFR calc non Af Amer: 3 mL/min — ABNORMAL LOW (ref >60)
Glucose, Bld: 332 mg/dL — ABNORMAL HIGH (ref 70–99)
Phosphorus: 7 mg/dL — ABNORMAL HIGH (ref 2.5–4.6)
Potassium: 6.3 mmol/L (ref 3.5–5.1)
Sodium: 129 mmol/L — ABNORMAL LOW (ref 135–145)

## 2019-08-13 LAB — HEPATITIS B SURFACE ANTIBODY,QUALITATIVE: Hep B S Ab: NONREACTIVE

## 2019-08-13 LAB — HEPATITIS B SURFACE ANTIGEN: Hepatitis B Surface Ag: NONREACTIVE

## 2019-08-15 LAB — QUANTIFERON-TB GOLD PLUS (RQFGPL)
QuantiFERON Mitogen Value: 5.37 IU/mL
QuantiFERON Nil Value: 0.06 IU/mL
QuantiFERON TB1 Ag Value: 0.09 IU/mL
QuantiFERON TB2 Ag Value: 0.09 IU/mL

## 2019-08-15 LAB — QUANTIFERON-TB GOLD PLUS: QuantiFERON-TB Gold Plus: NEGATIVE

## 2019-08-16 NOTE — Telephone Encounter (Signed)
Patient spouse instructed to wait until he goes to dialysis to have bandage removed.

## 2019-08-21 DIAGNOSIS — N179 Acute kidney failure, unspecified: Secondary | ICD-10-CM | POA: Diagnosis not present

## 2019-08-21 DIAGNOSIS — N10 Acute tubulo-interstitial nephritis: Secondary | ICD-10-CM | POA: Diagnosis not present

## 2019-08-21 DIAGNOSIS — I10 Essential (primary) hypertension: Secondary | ICD-10-CM | POA: Diagnosis not present

## 2019-08-21 DIAGNOSIS — D472 Monoclonal gammopathy: Secondary | ICD-10-CM | POA: Diagnosis not present

## 2019-08-28 DIAGNOSIS — N178 Other acute kidney failure: Secondary | ICD-10-CM | POA: Diagnosis not present

## 2019-08-28 DIAGNOSIS — D472 Monoclonal gammopathy: Secondary | ICD-10-CM | POA: Diagnosis not present

## 2019-08-28 DIAGNOSIS — Z992 Dependence on renal dialysis: Secondary | ICD-10-CM | POA: Diagnosis not present

## 2019-08-28 DIAGNOSIS — D649 Anemia, unspecified: Secondary | ICD-10-CM | POA: Diagnosis not present

## 2019-08-28 DIAGNOSIS — E441 Mild protein-calorie malnutrition: Secondary | ICD-10-CM | POA: Diagnosis not present

## 2019-08-28 DIAGNOSIS — N2581 Secondary hyperparathyroidism of renal origin: Secondary | ICD-10-CM | POA: Diagnosis not present

## 2019-08-30 DIAGNOSIS — I10 Essential (primary) hypertension: Secondary | ICD-10-CM | POA: Diagnosis not present

## 2019-08-30 DIAGNOSIS — D472 Monoclonal gammopathy: Secondary | ICD-10-CM | POA: Diagnosis not present

## 2019-08-30 DIAGNOSIS — N179 Acute kidney failure, unspecified: Secondary | ICD-10-CM | POA: Diagnosis not present

## 2019-08-30 DIAGNOSIS — N10 Acute tubulo-interstitial nephritis: Secondary | ICD-10-CM | POA: Diagnosis not present

## 2019-08-31 ENCOUNTER — Encounter (INDEPENDENT_AMBULATORY_CARE_PROVIDER_SITE_OTHER): Payer: Medicare Other

## 2019-08-31 ENCOUNTER — Ambulatory Visit (INDEPENDENT_AMBULATORY_CARE_PROVIDER_SITE_OTHER): Payer: Self-pay | Admitting: Nurse Practitioner

## 2019-08-31 ENCOUNTER — Other Ambulatory Visit (INDEPENDENT_AMBULATORY_CARE_PROVIDER_SITE_OTHER): Payer: Medicare Other

## 2019-09-10 DIAGNOSIS — N179 Acute kidney failure, unspecified: Secondary | ICD-10-CM | POA: Diagnosis not present

## 2019-09-11 DIAGNOSIS — E291 Testicular hypofunction: Secondary | ICD-10-CM | POA: Diagnosis not present

## 2019-09-11 DIAGNOSIS — E7849 Other hyperlipidemia: Secondary | ICD-10-CM | POA: Diagnosis not present

## 2019-09-11 DIAGNOSIS — Z0000 Encounter for general adult medical examination without abnormal findings: Secondary | ICD-10-CM | POA: Diagnosis not present

## 2019-09-11 DIAGNOSIS — E1151 Type 2 diabetes mellitus with diabetic peripheral angiopathy without gangrene: Secondary | ICD-10-CM | POA: Diagnosis not present

## 2019-09-11 DIAGNOSIS — Z125 Encounter for screening for malignant neoplasm of prostate: Secondary | ICD-10-CM | POA: Diagnosis not present

## 2019-09-17 DIAGNOSIS — N186 End stage renal disease: Secondary | ICD-10-CM | POA: Diagnosis not present

## 2019-09-17 DIAGNOSIS — N179 Acute kidney failure, unspecified: Secondary | ICD-10-CM | POA: Diagnosis not present

## 2019-09-17 DIAGNOSIS — N2889 Other specified disorders of kidney and ureter: Secondary | ICD-10-CM | POA: Diagnosis not present

## 2019-09-17 DIAGNOSIS — Z0000 Encounter for general adult medical examination without abnormal findings: Secondary | ICD-10-CM | POA: Diagnosis not present

## 2019-09-17 DIAGNOSIS — I10 Essential (primary) hypertension: Secondary | ICD-10-CM | POA: Diagnosis not present

## 2019-09-17 DIAGNOSIS — D472 Monoclonal gammopathy: Secondary | ICD-10-CM | POA: Diagnosis not present

## 2019-09-17 DIAGNOSIS — N10 Acute tubulo-interstitial nephritis: Secondary | ICD-10-CM | POA: Diagnosis not present

## 2019-09-19 ENCOUNTER — Telehealth (INDEPENDENT_AMBULATORY_CARE_PROVIDER_SITE_OTHER): Payer: Self-pay

## 2019-09-19 NOTE — Telephone Encounter (Signed)
Spoke with the patient's wife and he is now scheduled with Dr. Lucky Cowboy for a permcath removal on 09/24/19 with a 9:30 am arrival time to the [facility]. Patient will do covid testing on 09/20/19 between 12:30-2:30 pm at the [facility].pre-procedure instructions will be mailed to the patient.

## 2019-09-20 ENCOUNTER — Other Ambulatory Visit: Payer: Self-pay

## 2019-09-20 ENCOUNTER — Other Ambulatory Visit
Admission: RE | Admit: 2019-09-20 | Discharge: 2019-09-20 | Disposition: A | Discharge status: Home / Self Care | Payer: Medicare Other | Source: Ambulatory Visit | Attending: Vascular Surgery | Admitting: Vascular Surgery

## 2019-09-20 DIAGNOSIS — Z20822 Contact with and (suspected) exposure to covid-19: Secondary | ICD-10-CM | POA: Diagnosis not present

## 2019-09-20 DIAGNOSIS — Z01812 Encounter for preprocedural laboratory examination: Secondary | ICD-10-CM | POA: Insufficient documentation

## 2019-09-21 LAB — SARS CORONAVIRUS 2 (TAT 6-24 HRS): SARS Coronavirus 2: NEGATIVE

## 2019-09-24 ENCOUNTER — Encounter
Admission: RE | Disposition: A | Discharge status: Home / Self Care | Payer: Self-pay | Source: Home / Self Care | Attending: Vascular Surgery

## 2019-09-24 ENCOUNTER — Other Ambulatory Visit (INDEPENDENT_AMBULATORY_CARE_PROVIDER_SITE_OTHER): Payer: Self-pay | Admitting: Nurse Practitioner

## 2019-09-24 ENCOUNTER — Ambulatory Visit
Admission: RE | Admit: 2019-09-24 | Discharge: 2019-09-24 | Disposition: A | Discharge status: Home / Self Care | Payer: Medicare Other | Attending: Vascular Surgery | Admitting: Vascular Surgery

## 2019-09-24 DIAGNOSIS — E11649 Type 2 diabetes mellitus with hypoglycemia without coma: Secondary | ICD-10-CM | POA: Insufficient documentation

## 2019-09-24 DIAGNOSIS — E1122 Type 2 diabetes mellitus with diabetic chronic kidney disease: Secondary | ICD-10-CM | POA: Insufficient documentation

## 2019-09-24 DIAGNOSIS — Z4901 Encounter for fitting and adjustment of extracorporeal dialysis catheter: Secondary | ICD-10-CM | POA: Diagnosis not present

## 2019-09-24 DIAGNOSIS — N179 Acute kidney failure, unspecified: Secondary | ICD-10-CM | POA: Insufficient documentation

## 2019-09-24 DIAGNOSIS — N186 End stage renal disease: Secondary | ICD-10-CM | POA: Insufficient documentation

## 2019-09-24 DIAGNOSIS — Z87891 Personal history of nicotine dependence: Secondary | ICD-10-CM | POA: Diagnosis not present

## 2019-09-24 DIAGNOSIS — I129 Hypertensive chronic kidney disease with stage 1 through stage 4 chronic kidney disease, or unspecified chronic kidney disease: Secondary | ICD-10-CM | POA: Insufficient documentation

## 2019-09-24 DIAGNOSIS — Z8249 Family history of ischemic heart disease and other diseases of the circulatory system: Secondary | ICD-10-CM | POA: Diagnosis not present

## 2019-09-24 HISTORY — PX: DIALYSIS/PERMA CATHETER REMOVAL: CATH118289

## 2019-09-24 LAB — GLUCOSE, CAPILLARY: Glucose-Capillary: 111 mg/dL — ABNORMAL HIGH (ref 70–99)

## 2019-09-24 SURGERY — DIALYSIS/PERMA CATHETER REMOVAL
Anesthesia: LOCAL

## 2019-09-24 SURGICAL SUPPLY — 2 items
FORCEPS HALSTEAD CVD 5IN STRL (INSTRUMENTS) ×2 IMPLANT
TRAY LACERAT/PLASTIC (MISCELLANEOUS) ×2 IMPLANT

## 2019-09-24 NOTE — Op Note (Signed)
Operative Note  Preoperative diagnosis:    1. ESRD with functional permanent access  Postoperative diagnosis:   1. ESRD with functional permanent access  Procedure:  Removal of Right Permcath  Physician Assistant: Hezzie Bump PA-C  Supervision Surgeon:  Leotis Pain, MD  Anesthesia:  Local  EBL:  Minimal  Indication for the Procedure:  The patient has a functional permanent dialysis access and no longer needs their permcath.  This can be removed.  Risks and benefits are discussed and informed consent is obtained.  Description of the Procedure:  The patient's right neck, chest and existing catheter were sterilely prepped and draped. The area around the catheter was anesthetized copiously with 1% lidocaine. The catheter was dissected out with curved hemostats until the cuff was freed from the surrounding fibrous sheath. The fiber sheath was transected, and the catheter was then removed in its entirety using gentle traction. Pressure was held and sterile dressings were placed. The patient tolerated the procedure well and was taken to the recovery room in stable condition.  Amylee Lodato A Hadyn Blanck  09/24/2019, 11:46 AM  This note was created with Dragon Medical transcription system. Any errors in dictation are purely unintentional.

## 2019-09-24 NOTE — Discharge Instructions (Signed)
Tunneled Catheter Removal, Care After °Refer to this sheet in the next few weeks. These instructions provide you with information about caring for yourself after your procedure. Your health care provider may also give you more specific instructions. Your treatment has been planned according to current medical practices, but problems sometimes occur. Call your health care provider if you have any problems or questions after your procedure. °What can I expect after the procedure? °After the procedure, it is common to have: °· Some mild redness, swelling, and pain around your catheter site. ° ° °Follow these instructions at home: °Incision care  °· Check your removal site  every day for signs of infection. Check for: °¨ More redness, swelling, or pain. °¨ More fluid or blood. °¨ Warmth. °¨ Pus or a bad smell. °· Follow instructions from your health care provider about how to take care of your removal site. Make sure you: °¨ Wash your hands with soap and water before you change your bandages (dressings). If soap and water are not available, use hand sanitizer. °Activity  °· Return to your normal activities as told by your health care provider. Ask your health care provider what activities are safe for you. °· Do not lift anything that is heavier than 10 lb (4.5 kg) for 3 weeks or as long as told by your health care provider. ° °Contact a health care provider if: °· You have more fluid or blood coming from your removal site °· You have more redness, swelling, or pain at your incisions or around the area where your catheter was removed °· Your removal site feel warm to the touch. °· You feel unusually weak. °· You feel nauseous.. °· Get help right away if °· You have swelling in your arm, shoulder, neck, or face. °· You develop chest pain. °· You have difficulty breathing. °· You feel dizzy or light-headed. °· You have pus or a bad smell coming from your removal site °· You have a fever. °· You develop bleeding from your  removal site, and your bleeding does not stop. °This information is not intended to replace advice given to you by your health care provider. Make sure you discuss any questions you have with your health care provider. °Document Released: 06/28/2012 Document Revised: 03/14/2016 Document Reviewed: 04/07/2015 °Elsevier Interactive Patient Education © 2017 Elsevier Inc. ° °

## 2019-09-24 NOTE — H&P (Signed)
[facility] Admission History & Physical  MRN : [id number]  Austin Goodwin is a 69 y.o. ([birth year]) male who presents with chief complaint of No chief complaint on file. Marland Kitchen  History of Present Illness: I am asked to evaluate the patient by the dialysis center. The patient was sent here because they no longer need their PermCath.  A little over a month ago, he was in the hospital with acute kidney injury.  He required dialysis for a couple of weeks, but has not needed dialysis for about 2 weeks now and his nephrologist is comfortable removing his PermCath.  He still has chronic kidney disease present, but dialysis is not currently necessary.  Patient denies pain or tenderness overlying the access.  There is no pain with dialysis.  The patient denies hand pain or finger pain consistent with steal syndrome.  No fevers or chills while on dialysis.   No current facility-administered medications for this encounter.    Past Medical History:  Diagnosis Date  . Diabetes mellitus without complication ([place])   . Hypertension     Past Surgical History:  Procedure Laterality Date  . CATARACT EXTRACTION, BILATERAL    . DIALYSIS/PERMA CATHETER INSERTION N/A 08/10/2019   Procedure: DIALYSIS/PERMA CATHETER INSERTION;  Surgeon: Algernon Huxley, MD;  Location: [facility];  Service: Cardiovascular;  Laterality: N/A;     Social History   Tobacco Use  . Smoking status: Former Smoker    Types: Cigarettes    Quit date: 07/27/1999    Years since quitting: 20.1  . Smokeless tobacco: Never Used  Substance Use Topics  . Alcohol use: Yes  . Drug use: No     Family History  Problem Relation Age of Onset  . Mitral valve prolapse Mother   . Heart disease Father     No family history of bleeding or clotting disorders, autoimmune disease or porphyria  No Known Allergies   REVIEW OF SYSTEMS (Negative unless checked)  Constitutional: [] Weight loss  [] Fever   [] Chills Cardiac: [] Chest pain   [] Chest pressure   [] Palpitations   [] Shortness of breath when laying flat   [] Shortness of breath at rest   [x] Shortness of breath with exertion. Vascular:  [] Pain in legs with walking   [] Pain in legs at rest   [] Pain in legs when laying flat   [] Claudication   [] Pain in feet when walking  [] Pain in feet at rest  [] Pain in feet when laying flat   [] History of DVT   [] Phlebitis   [] Swelling in legs   [] Varicose veins   [] Non-healing ulcers Pulmonary:   [] Uses home oxygen   [] Productive cough   [] Hemoptysis   [] Wheeze  [] COPD   [] Asthma Neurologic:  [] Dizziness  [] Blackouts   [] Seizures   [] History of stroke   [] History of TIA  [] Aphasia   [] Temporary blindness   [] Dysphagia   [] Weakness or numbness in arms   [] Weakness or numbness in legs Musculoskeletal:  [] Arthritis   [] Joint swelling   [] Joint pain   [] Low back pain Hematologic:  [] Easy bruising  [] Easy bleeding   [] Hypercoagulable state   [] Anemic  [] Hepatitis Gastrointestinal:  [] Blood in stool   [] Vomiting blood  [] Gastroesophageal reflux/heartburn   [] Difficulty swallowing. Genitourinary:  [x] Chronic kidney disease   [] Difficult urination  [] Frequent urination  [] Burning with urination   [] Blood in urine Skin:  [] Rashes   [] Ulcers   [] Wounds Psychological:  [] History of anxiety   []  History of major depression.  Physical Examination  Vitals:   09/24/19 0931  BP: (!) 156/97  Pulse: 88  Resp: 12  SpO2: 100%   There is no height or weight on file to calculate BMI. Gen: WD/WN, NAD Head: [place]/AT, No temporalis wasting Ear/Nose/Throat: Hearing grossly intact, nares w/o erythema or drainage, oropharynx w/o Erythema/Exudate,  Eyes: Conjunctiva clear, sclera non-icteric Neck: Trachea midline.  No JVD.  Pulmonary:  Good air movement, respirations not labored, no use of accessory muscles.  Cardiac: RRR, normal S1, S2. Vascular: PermCath present right chest Vessel Right Left  Radial Palpable Palpable    Musculoskeletal: M/S 5/5 throughout.  Extremities without ischemic changes.  No deformity or atrophy.  Neurologic: Sensation grossly intact in extremities.  Symmetrical.  Speech is fluent. Motor exam as listed above. Psychiatric: Judgment intact, Mood & affect appropriate for pt's clinical situation. Dermatologic: No rashes or ulcers noted.  No cellulitis or open wounds.    CBC Lab Results  Component Value Date   WBC 23.5 (H) 08/03/2019   HGB 10.4 (L) 08/03/2019   HCT 31.6 (L) 08/03/2019   MCV 101.0 (H) 08/03/2019   PLT 325 08/03/2019    BMET    Component Value Date/Time   NA 129 (L) 08/13/2019 1306   K 6.3 (HH) 08/13/2019 1306   CL 102 08/13/2019 1306   CO2 11 (L) 08/13/2019 1306   GLUCOSE 332 (H) 08/13/2019 1306   BUN 138 (H) 08/13/2019 1306   CREATININE 14.66 (H) 08/13/2019 1306   CREATININE 7.85 (HH) 07/30/2019 1120   CALCIUM 8.6 (L) 08/13/2019 1306   GFRNONAA 3 (L) 08/13/2019 1306   GFRNONAA 6 (L) 07/30/2019 1120   GFRAA 3 (L) 08/13/2019 1306   GFRAA 7 (L) 07/30/2019 1120   CrCl cannot be calculated (Patient's most recent lab result is older than the maximum 21 days allowed.).  [facility]AG Lab Results  Component Value Date   INR 1.3 (H) 08/03/2019    Radiology No results found.  Assessment/Plan 1.  Acute kidney injury with return of renal function.  Although chronic kidney disease is still present, he does not require dialysis so his PermCath be removed.  Risks and benefits are discussed.  Informed consent is obtained. 2. Hypertension:  Patient will continue medical management; nephrology is following no changes in oral medications. 3. Diabetes mellitus:  Glucose will be monitored and oral medications been held this morning once the patient has undergone the patient's procedure po intake will be reinitiated and again Accu-Cheks will be used to assess the blood glucose level and treat as needed. The patient will be restarted on the patient's usual hypoglycemic  regime     Leotis Pain, MD  09/24/2019 9:32 AM

## 2019-09-25 ENCOUNTER — Encounter: Payer: Self-pay | Admitting: Cardiology

## 2019-10-03 DIAGNOSIS — M170 Bilateral primary osteoarthritis of knee: Secondary | ICD-10-CM | POA: Diagnosis not present

## 2019-10-03 DIAGNOSIS — M25561 Pain in right knee: Secondary | ICD-10-CM | POA: Diagnosis not present

## 2019-10-03 DIAGNOSIS — M25562 Pain in left knee: Secondary | ICD-10-CM | POA: Diagnosis not present

## 2019-10-10 DIAGNOSIS — M25562 Pain in left knee: Secondary | ICD-10-CM | POA: Diagnosis not present

## 2019-10-10 DIAGNOSIS — M170 Bilateral primary osteoarthritis of knee: Secondary | ICD-10-CM | POA: Diagnosis not present

## 2019-10-10 DIAGNOSIS — M25561 Pain in right knee: Secondary | ICD-10-CM | POA: Diagnosis not present

## 2019-10-12 DIAGNOSIS — H43821 Vitreomacular adhesion, right eye: Secondary | ICD-10-CM | POA: Diagnosis not present

## 2019-10-12 DIAGNOSIS — E113293 Type 2 diabetes mellitus with mild nonproliferative diabetic retinopathy without macular edema, bilateral: Secondary | ICD-10-CM | POA: Diagnosis not present

## 2019-10-12 DIAGNOSIS — H43813 Vitreous degeneration, bilateral: Secondary | ICD-10-CM | POA: Diagnosis not present

## 2019-10-17 DIAGNOSIS — M25562 Pain in left knee: Secondary | ICD-10-CM | POA: Diagnosis not present

## 2019-10-17 DIAGNOSIS — M25561 Pain in right knee: Secondary | ICD-10-CM | POA: Diagnosis not present

## 2019-10-17 DIAGNOSIS — M1712 Unilateral primary osteoarthritis, left knee: Secondary | ICD-10-CM | POA: Diagnosis not present

## 2019-10-17 DIAGNOSIS — M1711 Unilateral primary osteoarthritis, right knee: Secondary | ICD-10-CM | POA: Diagnosis not present

## 2019-11-13 DIAGNOSIS — Z0120 Encounter for dental examination and cleaning without abnormal findings: Secondary | ICD-10-CM | POA: Diagnosis not present

## 2019-11-16 ENCOUNTER — Inpatient Hospital Stay: Admit: 2019-11-16 | Payer: Medicare Other | Admitting: Family Medicine

## 2019-11-16 DIAGNOSIS — K8020 Calculus of gallbladder without cholecystitis without obstruction: Secondary | ICD-10-CM | POA: Diagnosis not present

## 2019-11-16 DIAGNOSIS — I700 Atherosclerosis of aorta: Secondary | ICD-10-CM | POA: Diagnosis not present

## 2019-11-16 DIAGNOSIS — I10 Essential (primary) hypertension: Secondary | ICD-10-CM | POA: Diagnosis not present

## 2019-11-16 DIAGNOSIS — N1330 Unspecified hydronephrosis: Secondary | ICD-10-CM | POA: Diagnosis not present

## 2019-11-16 DIAGNOSIS — Z7982 Long term (current) use of aspirin: Secondary | ICD-10-CM | POA: Diagnosis not present

## 2019-11-16 DIAGNOSIS — Z7984 Long term (current) use of oral hypoglycemic drugs: Secondary | ICD-10-CM | POA: Diagnosis not present

## 2019-11-16 DIAGNOSIS — D72829 Elevated white blood cell count, unspecified: Secondary | ICD-10-CM | POA: Diagnosis not present

## 2019-11-16 DIAGNOSIS — Z8673 Personal history of transient ischemic attack (TIA), and cerebral infarction without residual deficits: Secondary | ICD-10-CM | POA: Diagnosis not present

## 2019-11-16 DIAGNOSIS — N12 Tubulo-interstitial nephritis, not specified as acute or chronic: Secondary | ICD-10-CM | POA: Diagnosis not present

## 2019-11-16 DIAGNOSIS — Z87891 Personal history of nicotine dependence: Secondary | ICD-10-CM | POA: Diagnosis not present

## 2019-11-16 DIAGNOSIS — R1110 Vomiting, unspecified: Secondary | ICD-10-CM | POA: Diagnosis not present

## 2019-11-16 DIAGNOSIS — E7800 Pure hypercholesterolemia, unspecified: Secondary | ICD-10-CM | POA: Diagnosis not present

## 2019-11-16 DIAGNOSIS — R1032 Left lower quadrant pain: Secondary | ICD-10-CM | POA: Diagnosis not present

## 2019-11-16 DIAGNOSIS — E119 Type 2 diabetes mellitus without complications: Secondary | ICD-10-CM | POA: Diagnosis not present

## 2019-11-16 DIAGNOSIS — Z79899 Other long term (current) drug therapy: Secondary | ICD-10-CM | POA: Diagnosis not present

## 2019-11-16 DIAGNOSIS — Z20822 Contact with and (suspected) exposure to covid-19: Secondary | ICD-10-CM | POA: Diagnosis not present

## 2019-11-17 ENCOUNTER — Inpatient Hospital Stay
Admission: AD | Admit: 2019-11-17 | Discharge: 2019-11-21 | DRG: 872 | Disposition: A | Discharge status: Home / Self Care | Payer: Medicare Other | Source: Other Acute Inpatient Hospital | Attending: Internal Medicine | Admitting: Internal Medicine

## 2019-11-17 ENCOUNTER — Ambulatory Visit (HOSPITAL_COMMUNITY)
Admission: AD | Admit: 2019-11-17 | Discharge: 2019-11-17 | Disposition: A | Discharge status: Home / Self Care | Payer: Medicare Other | Source: Other Acute Inpatient Hospital | Attending: Family Medicine | Admitting: Family Medicine

## 2019-11-17 ENCOUNTER — Encounter: Payer: Self-pay | Admitting: Family Medicine

## 2019-11-17 ENCOUNTER — Other Ambulatory Visit: Payer: Self-pay

## 2019-11-17 DIAGNOSIS — Z7289 Other problems related to lifestyle: Secondary | ICD-10-CM

## 2019-11-17 DIAGNOSIS — I700 Atherosclerosis of aorta: Secondary | ICD-10-CM | POA: Diagnosis not present

## 2019-11-17 DIAGNOSIS — N2581 Secondary hyperparathyroidism of renal origin: Secondary | ICD-10-CM | POA: Diagnosis not present

## 2019-11-17 DIAGNOSIS — Z79899 Other long term (current) drug therapy: Secondary | ICD-10-CM | POA: Diagnosis not present

## 2019-11-17 DIAGNOSIS — N390 Urinary tract infection, site not specified: Secondary | ICD-10-CM | POA: Diagnosis not present

## 2019-11-17 DIAGNOSIS — N10 Acute tubulo-interstitial nephritis: Secondary | ICD-10-CM | POA: Diagnosis not present

## 2019-11-17 DIAGNOSIS — A4150 Gram-negative sepsis, unspecified: Secondary | ICD-10-CM | POA: Diagnosis not present

## 2019-11-17 DIAGNOSIS — Z87891 Personal history of nicotine dependence: Secondary | ICD-10-CM

## 2019-11-17 DIAGNOSIS — Z7984 Long term (current) use of oral hypoglycemic drugs: Secondary | ICD-10-CM | POA: Diagnosis not present

## 2019-11-17 DIAGNOSIS — E1122 Type 2 diabetes mellitus with diabetic chronic kidney disease: Secondary | ICD-10-CM | POA: Diagnosis present

## 2019-11-17 DIAGNOSIS — Z79891 Long term (current) use of opiate analgesic: Secondary | ICD-10-CM

## 2019-11-17 DIAGNOSIS — D631 Anemia in chronic kidney disease: Secondary | ICD-10-CM | POA: Diagnosis present

## 2019-11-17 DIAGNOSIS — R7881 Bacteremia: Secondary | ICD-10-CM

## 2019-11-17 DIAGNOSIS — N179 Acute kidney failure, unspecified: Secondary | ICD-10-CM | POA: Diagnosis not present

## 2019-11-17 DIAGNOSIS — I160 Hypertensive urgency: Secondary | ICD-10-CM

## 2019-11-17 DIAGNOSIS — N12 Tubulo-interstitial nephritis, not specified as acute or chronic: Secondary | ICD-10-CM | POA: Diagnosis not present

## 2019-11-17 DIAGNOSIS — E1165 Type 2 diabetes mellitus with hyperglycemia: Secondary | ICD-10-CM | POA: Diagnosis not present

## 2019-11-17 DIAGNOSIS — A419 Sepsis, unspecified organism: Principal | ICD-10-CM | POA: Diagnosis present

## 2019-11-17 DIAGNOSIS — N185 Chronic kidney disease, stage 5: Secondary | ICD-10-CM | POA: Diagnosis not present

## 2019-11-17 DIAGNOSIS — E785 Hyperlipidemia, unspecified: Secondary | ICD-10-CM

## 2019-11-17 DIAGNOSIS — D472 Monoclonal gammopathy: Secondary | ICD-10-CM | POA: Diagnosis present

## 2019-11-17 DIAGNOSIS — Z8249 Family history of ischemic heart disease and other diseases of the circulatory system: Secondary | ICD-10-CM

## 2019-11-17 DIAGNOSIS — E875 Hyperkalemia: Secondary | ICD-10-CM | POA: Diagnosis not present

## 2019-11-17 DIAGNOSIS — N189 Chronic kidney disease, unspecified: Secondary | ICD-10-CM | POA: Diagnosis not present

## 2019-11-17 DIAGNOSIS — Z7982 Long term (current) use of aspirin: Secondary | ICD-10-CM

## 2019-11-17 DIAGNOSIS — Z794 Long term (current) use of insulin: Secondary | ICD-10-CM | POA: Diagnosis not present

## 2019-11-17 DIAGNOSIS — B957 Other staphylococcus as the cause of diseases classified elsewhere: Secondary | ICD-10-CM

## 2019-11-17 DIAGNOSIS — N3080 Other cystitis without hematuria: Secondary | ICD-10-CM | POA: Diagnosis not present

## 2019-11-17 DIAGNOSIS — K8020 Calculus of gallbladder without cholecystitis without obstruction: Secondary | ICD-10-CM | POA: Diagnosis not present

## 2019-11-17 DIAGNOSIS — I120 Hypertensive chronic kidney disease with stage 5 chronic kidney disease or end stage renal disease: Secondary | ICD-10-CM | POA: Diagnosis present

## 2019-11-17 DIAGNOSIS — A4151 Sepsis due to Escherichia coli [E. coli]: Secondary | ICD-10-CM

## 2019-11-17 DIAGNOSIS — R0600 Dyspnea, unspecified: Secondary | ICD-10-CM

## 2019-11-17 DIAGNOSIS — Z8673 Personal history of transient ischemic attack (TIA), and cerebral infarction without residual deficits: Secondary | ICD-10-CM

## 2019-11-17 DIAGNOSIS — I10 Essential (primary) hypertension: Secondary | ICD-10-CM

## 2019-11-17 DIAGNOSIS — E119 Type 2 diabetes mellitus without complications: Secondary | ICD-10-CM

## 2019-11-17 DIAGNOSIS — E538 Deficiency of other specified B group vitamins: Secondary | ICD-10-CM | POA: Diagnosis not present

## 2019-11-17 DIAGNOSIS — N1330 Unspecified hydronephrosis: Secondary | ICD-10-CM | POA: Diagnosis not present

## 2019-11-17 DIAGNOSIS — E7800 Pure hypercholesterolemia, unspecified: Secondary | ICD-10-CM | POA: Diagnosis not present

## 2019-11-17 DIAGNOSIS — Z20822 Contact with and (suspected) exposure to covid-19: Secondary | ICD-10-CM | POA: Diagnosis present

## 2019-11-17 DIAGNOSIS — D72829 Elevated white blood cell count, unspecified: Secondary | ICD-10-CM | POA: Diagnosis not present

## 2019-11-17 DIAGNOSIS — I129 Hypertensive chronic kidney disease with stage 1 through stage 4 chronic kidney disease, or unspecified chronic kidney disease: Secondary | ICD-10-CM | POA: Diagnosis not present

## 2019-11-17 DIAGNOSIS — R6520 Severe sepsis without septic shock: Secondary | ICD-10-CM | POA: Diagnosis not present

## 2019-11-17 DIAGNOSIS — R0602 Shortness of breath: Secondary | ICD-10-CM | POA: Diagnosis not present

## 2019-11-17 DIAGNOSIS — E872 Acidosis: Secondary | ICD-10-CM | POA: Diagnosis not present

## 2019-11-17 DIAGNOSIS — N184 Chronic kidney disease, stage 4 (severe): Secondary | ICD-10-CM | POA: Diagnosis not present

## 2019-11-17 LAB — BASIC METABOLIC PANEL
Anion gap: 10 (ref 5–15)
BUN: 53 mg/dL — ABNORMAL HIGH (ref 8–23)
CO2: 20 mmol/L — ABNORMAL LOW (ref 22–32)
Calcium: 8 mg/dL — ABNORMAL LOW (ref 8.9–10.3)
Chloride: 106 mmol/L (ref 98–111)
Creatinine, Ser: 4.13 mg/dL — ABNORMAL HIGH (ref 0.61–1.24)
GFR calc Af Amer: 16 mL/min — ABNORMAL LOW (ref >60)
GFR calc non Af Amer: 14 mL/min — ABNORMAL LOW (ref >60)
Glucose, Bld: 166 mg/dL — ABNORMAL HIGH (ref 70–99)
Potassium: 5.2 mmol/L — ABNORMAL HIGH (ref 3.5–5.1)
Sodium: 136 mmol/L (ref 135–145)

## 2019-11-17 LAB — PROTIME-INR
INR: 1.3 — ABNORMAL HIGH (ref 0.8–1.2)
Prothrombin Time: 16 seconds — ABNORMAL HIGH (ref 11.4–15.2)

## 2019-11-17 LAB — CBC WITH DIFFERENTIAL/PLATELET
Abs Immature Granulocytes: 0.15 10*3/uL — ABNORMAL HIGH (ref 0.00–0.07)
Basophils Absolute: 0.1 10*3/uL (ref 0.0–0.1)
Basophils Relative: 0 %
Eosinophils Absolute: 0 10*3/uL (ref 0.0–0.5)
Eosinophils Relative: 0 %
HCT: 32.6 % — ABNORMAL LOW (ref 39.0–52.0)
Hemoglobin: 10.3 g/dL — ABNORMAL LOW (ref 13.0–17.0)
Immature Granulocytes: 1 %
Lymphocytes Relative: 3 %
Lymphs Abs: 0.7 10*3/uL (ref 0.7–4.0)
MCH: 33.2 pg (ref 26.0–34.0)
MCHC: 31.6 g/dL (ref 30.0–36.0)
MCV: 105.2 fL — ABNORMAL HIGH (ref 80.0–100.0)
Monocytes Absolute: 1.2 10*3/uL — ABNORMAL HIGH (ref 0.1–1.0)
Monocytes Relative: 6 %
Neutro Abs: 18.2 10*3/uL — ABNORMAL HIGH (ref 1.7–7.7)
Neutrophils Relative %: 90 %
Platelets: 146 10*3/uL — ABNORMAL LOW (ref 150–400)
RBC: 3.1 MIL/uL — ABNORMAL LOW (ref 4.22–5.81)
RDW: 15 % (ref 11.5–15.5)
Smear Review: NORMAL
WBC Morphology: INCREASED
WBC: 20.3 10*3/uL — ABNORMAL HIGH (ref 4.0–10.5)
nRBC: 0 % (ref 0.0–0.2)

## 2019-11-17 LAB — GLUCOSE, CAPILLARY
Glucose-Capillary: 145 mg/dL — ABNORMAL HIGH (ref 70–99)
Glucose-Capillary: 154 mg/dL — ABNORMAL HIGH (ref 70–99)
Glucose-Capillary: 179 mg/dL — ABNORMAL HIGH (ref 70–99)
Glucose-Capillary: 169 mg/dL — ABNORMAL HIGH (ref 70–99)
Glucose-Capillary: 89 mg/dL (ref 70–99)

## 2019-11-17 LAB — HEMOGLOBIN A1C
Hgb A1c MFr Bld: 6.5 % — ABNORMAL HIGH (ref 4.8–5.6)
Mean Plasma Glucose: 139.85 mg/dL

## 2019-11-17 LAB — SARS CORONAVIRUS 2 (TAT 6-24 HRS): SARS Coronavirus 2: NEGATIVE

## 2019-11-17 MED ORDER — SODIUM CHLORIDE 0.9 % IV SOLN
125.00 | INTRAVENOUS | Status: DC
Start: ? — End: 2019-11-17

## 2019-11-17 MED ORDER — ROSUVASTATIN CALCIUM 10 MG PO TABS: 10.0000 mg | ORAL_TABLET | ORAL | Status: DC

## 2019-11-17 MED ORDER — ACETAMINOPHEN 650 MG RE SUPP: 650.0000 mg | Freq: Four times a day (QID) | RECTAL | Status: DC | PRN

## 2019-11-17 MED ORDER — ENOXAPARIN SODIUM 30 MG/0.3ML ~~LOC~~ SOLN
30.0000 mg | SUBCUTANEOUS | Status: DC
  Administered 2019-11-17 – 2019-11-21 (×5): 30 mg via SUBCUTANEOUS
  Filled 2019-11-17 (×6): qty 0.3

## 2019-11-17 MED ORDER — SODIUM POLYSTYRENE SULFONATE 15 GM/60ML PO SUSP
30.0000 g | Freq: Once | ORAL | Status: AC
  Administered 2019-11-17: 30 g via ORAL
  Filled 2019-11-17: qty 120

## 2019-11-17 MED ORDER — ADULT MULTIVITAMIN W/MINERALS CH
1.0000 | ORAL_TABLET | Freq: Every day | ORAL | Status: DC
  Administered 2019-11-17 – 2019-11-21 (×5): 1 via ORAL
  Filled 2019-11-17 (×5): qty 1

## 2019-11-17 MED ORDER — INSULIN DETEMIR 100 UNIT/ML ~~LOC~~ SOLN
5.0000 [IU] | Freq: Every day | SUBCUTANEOUS | Status: DC
  Administered 2019-11-17: 5 [IU] via SUBCUTANEOUS
  Filled 2019-11-17 (×2): qty 0.05

## 2019-11-17 MED ORDER — ENOXAPARIN SODIUM 40 MG/0.4ML ~~LOC~~ SOLN: 40.0000 mg | SUBCUTANEOUS | Status: DC

## 2019-11-17 MED ORDER — SODIUM CHLORIDE 0.9 % IV SOLN
2.0000 g | INTRAVENOUS | Status: DC
  Filled 2019-11-17: qty 20

## 2019-11-17 MED ORDER — ONDANSETRON HCL 4 MG/2ML IJ SOLN
4.0000 mg | Freq: Four times a day (QID) | INTRAMUSCULAR | Status: DC | PRN
  Administered 2019-11-17: 4 mg via INTRAVENOUS
  Filled 2019-11-17: qty 2

## 2019-11-17 MED ORDER — ONDANSETRON HCL 4 MG PO TABS: 4.0000 mg | ORAL_TABLET | Freq: Four times a day (QID) | ORAL | Status: DC | PRN

## 2019-11-17 MED ORDER — VITAMIN B-12 1000 MCG PO TABS
1000.0000 ug | ORAL_TABLET | Freq: Every day | ORAL | Status: DC
  Administered 2019-11-17 – 2019-11-21 (×5): 1000 ug via ORAL
  Filled 2019-11-17 (×5): qty 1

## 2019-11-17 MED ORDER — INSULIN ASPART 100 UNIT/ML ~~LOC~~ SOLN
0.0000 [IU] | Freq: Four times a day (QID) | SUBCUTANEOUS | Status: DC
  Administered 2019-11-17 (×2): 2 [IU] via SUBCUTANEOUS
  Administered 2019-11-17: 1 [IU] via SUBCUTANEOUS
  Administered 2019-11-18: 2 [IU] via SUBCUTANEOUS
  Filled 2019-11-17 (×4): qty 1

## 2019-11-17 MED ORDER — SODIUM CHLORIDE 0.9 % IV SOLN: INTRAVENOUS | Status: DC

## 2019-11-17 MED ORDER — TRAZODONE HCL 50 MG PO TABS: 25.0000 mg | ORAL_TABLET | Freq: Every evening | ORAL | Status: DC | PRN

## 2019-11-17 MED ORDER — MAGNESIUM HYDROXIDE 400 MG/5ML PO SUSP: 30.0000 mL | Freq: Every day | ORAL | Status: DC | PRN

## 2019-11-17 MED ORDER — AMLODIPINE BESYLATE 10 MG PO TABS
10.0000 mg | ORAL_TABLET | Freq: Every day | ORAL | Status: DC
  Administered 2019-11-17 – 2019-11-21 (×5): 10 mg via ORAL
  Filled 2019-11-17 (×5): qty 1

## 2019-11-17 MED ORDER — TRAMADOL HCL 50 MG PO TABS
50.0000 mg | ORAL_TABLET | Freq: Two times a day (BID) | ORAL | Status: DC | PRN
  Administered 2019-11-17 – 2019-11-19 (×2): 50 mg via ORAL
  Filled 2019-11-17 (×2): qty 1

## 2019-11-17 MED ORDER — ACETAMINOPHEN 325 MG PO TABS
650.0000 mg | ORAL_TABLET | Freq: Four times a day (QID) | ORAL | Status: DC | PRN
  Administered 2019-11-19: 650 mg via ORAL
  Filled 2019-11-17: qty 2

## 2019-11-17 MED ORDER — SODIUM CHLORIDE 0.9 % IV SOLN
2.0000 g | INTRAVENOUS | Status: DC
  Administered 2019-11-17 – 2019-11-21 (×5): 2 g via INTRAVENOUS
  Filled 2019-11-17 (×5): qty 2

## 2019-11-17 MED ORDER — GLIPIZIDE 10 MG PO TABS: 10.0000 mg | ORAL_TABLET | Freq: Two times a day (BID) | ORAL | Status: DC

## 2019-11-17 NOTE — Progress Notes (Addendum)
[facility] PROGRESS NOTE    Progress Note  Austin Goodwin  SLH:[id number] DOB: [birth year] DOA: 11/17/2019 PCP: Reynold Bowen, MD     Brief Narrative:   Austin Goodwin is an 69 y.o. male past medical history of diabetes mellitus type 2, hypertension chronic kidney disease to which at one point he needed hemodialysis for 2 months and then renal function return she status post removal of a dialysis catheter who presented [facility] with acute onset of fever and severe flank pain was found to be febrile with a white count of 18 left shift.  She had an abdominal CT that revealed hydronephrosis with diffuse left perinephric stranding  Assessment/Plan:   Sepsis secondary to UTI ([place]) 2/2 to acute pyelonephritis: He has been fluid resuscitated she was given IV vancomycin and Zosyn at [facility] ER. White count of 17.8 with a left shift at [facility] ER. Blood cultures from [facility] ED grew gram-negative rods Switched to IV Rocephin and continue with fluid resuscitation urology was consulted conservative management for acute emphysematous bilateral pyelonephritis. Urology was consulted and recommended to continue to monitor fever curve, and observe if no improvement repeat a CT scan without contrast to evaluate for renal abscess.   They also recommended aggressive diabetes control. He has defervesced he continues to have an elevated white blood cell count.  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  Hyperkalemia: Likely due to acute renal failure give more Kayexalate and IV fluids recheck tomorrow morning.  Uncontrolled diabetes mellitus type 2 with hyperglycemia: Likely secondary to #1. Fairly controlled continue long-acting insulin plus sliding scale.  Hypertensive urgency/essential HTN: Likely related to pain her lisinopril  was held due to her acute renal failure. She was continued on amlodipine, will add hydralazine IV.  Dyslipidemia: Continue statin therapy.  Vitamin B12 deficiency: Continue supplementation.    DVT prophylaxis: lovenox Family Communication:none Status is: Inpatient  Remains inpatient appropriate because:Hemodynamically unstable   Dispo: The patient is from: Home              Anticipated d/c is to: Home              Anticipated d/c date is: 3 days              Patient currently is not medically stable to d/c.   Code Status:     Code Status Orders  (From admission, onward)         Start     Ordered   11/17/19 0512  Full code  Continuous     11/17/19 0513        Code Status History    Date Active Date Inactive Code Status Order ID Comments User Context   07/02/2019 1922 07/04/2019 1910 Full Code [id number]  Orene Desanctis, DO ED   06/30/2019 0105 06/30/2019 1135 Full Code [id number]  Pearlean Brownie, MD ED   Advance Care Planning Activity        IV Access:    Peripheral IV   Procedures and diagnostic studies:   No results found.   Medical Consultants:    None.  Anti-Infectives:   IV Rocephin  Subjective:    [name] relates he continues to have pain in his left lower quadrant.  Objective:    Vitals:   11/17/19 0510  BP: 118/72  Pulse: 99  Resp:  20  Temp: 98.7 F (37.1 C)  TempSrc: Oral  Weight: 86.6 kg  Height: 6' (1.829 m)       Intake/Output Summary (Last 24 hours) at 11/17/2019 0701 Last data filed at 11/17/2019 0607 Gross per 24 hour  Intake 0 ml  Output --  Net 0 ml   Filed Weights   11/17/19 0510  Weight: 86.6 kg    Exam: General exam: In no acute distress. Respiratory system: Good air movement and clear to auscultation. Cardiovascular system: S1 & S2 heard, RRR. No JVD. Gastrointestinal system: Abdomen is soft left lower quadrant tenderness no rebound or guarding positive bowel sounds Central nervous system: Alert  and oriented. No focal neurological deficits. Extremities: No pedal edema. Skin: No rashes, lesions or ulcers   Data Reviewed:    Labs: Basic Metabolic Panel: No results for input(s): NA, K, CL, CO2, GLUCOSE, BUN, CREATININE, CALCIUM, MG, PHOS in the last 168 hours. GFR CrCl cannot be calculated (Patient's most recent lab result is older than the maximum 21 days allowed.). Liver Function Tests: No results for input(s): AST, ALT, ALKPHOS, BILITOT, PROT, ALBUMIN in the last 168 hours. No results for input(s): LIPASE, AMYLASE in the last 168 hours. No results for input(s): AMMONIA in the last 168 hours. Coagulation profile No results for input(s): INR, PROTIME in the last 168 hours. COVID-19 Labs  No results for input(s): DDIMER, FERRITIN, LDH, CRP in the last 72 hours.  Lab Results  Component Value Date   SARSCOV2NAA NEGATIVE 09/20/2019   SARSCOV2NAA NEGATIVE 08/08/2019   [place] NEGATIVE 07/02/2019   [place] NEGATIVE 06/29/2019    CBC: No results for input(s): WBC, NEUTROABS, HGB, HCT, MCV, PLT in the last 168 hours. Cardiac Enzymes: No results for input(s): CKTOTAL, CKMB, CKMBINDEX, TROPONINI in the last 168 hours. BNP (last 3 results) No results for input(s): PROBNP in the last 8760 hours. CBG: Recent Labs  Lab 11/17/19 0643  GLUCAP 145*   D-Dimer: No results for input(s): DDIMER in the last 72 hours. Hgb A1c: No results for input(s): HGBA1C in the last 72 hours. Lipid Profile: No results for input(s): CHOL, HDL, LDLCALC, TRIG, CHOLHDL, LDLDIRECT in the last 72 hours. Thyroid function studies: No results for input(s): TSH, T4TOTAL, T3FREE, THYROIDAB in the last 72 hours.  Invalid input(s): FREET3 Anemia work up: No results for input(s): VITAMINB12, FOLATE, FERRITIN, TIBC, IRON, RETICCTPCT in the last 72 hours. Sepsis Labs: No results for input(s): PROCALCITON, WBC, LATICACIDVEN in the last 168 hours. Microbiology No results found for this or any  previous visit (from the past 240 hour(s)).   Medications:   . amLODipine  10 mg Oral Daily  . enoxaparin (LOVENOX) injection  40 mg Subcutaneous Q24H  . insulin aspart  0-9 Units Subcutaneous Q6H  . multivitamin with minerals  1 tablet Oral Daily  . [START ON 11/21/2019] rosuvastatin  10 mg Oral Weekly  . vitamin B-12  1,000 mcg Oral Daily   Continuous Infusions: . sodium chloride 100 mL/hr at 11/17/19 0607  . cefTRIAXone (ROCEPHIN)  IV        LOS: 0 days   Charlynne Cousins  [facility]  11/17/2019, 7:01 AM

## 2019-11-17 NOTE — Progress Notes (Addendum)
Urology Consult   I have been asked to see the patient by Dr. Sidney Ace, for evaluation and management of UTI with gas in the bladder and collecting systems bilaterally.  Chief Complaint: Left flank and abdominal pain  HPI:  LANGSTON TUBERVILLE is a 69 y.o. year old with past medical history notable for poorly controlled type 2 diabetes, hypertension, and CKD with baseline creatinine of 3-4 who presented to the [facility] ED last night with subjective fevers and 12 hours of left-sided flank and abdominal pain.  His history is interesting in that in December 2020 he was diagnosed with acute renal failure requiring dialysis.  Imaging showed no hydronephrosis or significant renal abnormalities at that time, and he was managed by nephrology.  He underwent a kidney biopsy in January 2021 that showed plasma cell rich acute tubulointerstitial nephritis, which was thought to be drug-induced as opposed to from an autoimmune disease.  Renal function recovered, and he ultimately no longer required dialysis.  Hemoglobin A1c in December 2020 was 7.9.  He reports the 12 hours of left-sided flank and abdominal pain with subjective fevers and some nausea that caused him to present to the [facility] ED last night.  He was febrile to 103 with low-grade tachycardia and stable blood pressure, urinalysis grossly concerning for infection with bacteria, WBC clumps, greater than 20 WBCs, greater than 10 RBCs, nitrite negative, moderate leuk esterase, leukocytosis to 17.8k, creatinine stable at 3.1, blood culture PCR showing E. coli, and CT showing significant gas in the bladder and bilateral collecting systems.  The CT was notable for some mild bilateral hydronephrosis with gas in the collecting systems bilaterally and significant left perinephric stranding.  There were no obstructing stones.  Bladder was only mildly full.  He refused catheter placement at outside hospital.  He was given Vanc/Zosyn and transferred to [facility] for further  evaluation.  This morning, he reports he is having some mild left-sided flank and abdominal pain but overall feels well.  He denies any urinary symptoms of dysuria, urgency/frequency, weak stream, or feeling of incomplete emptying.  He denies any history of urinary tract infections.  He denies any gross hematuria.  He does admit to pneumaturia over the last 3 to 4 weeks.  He is afebrile and hemodynamically stable this morning.  PMH: Past Medical History:  Diagnosis Date  . Diabetes mellitus without complication ([place])   . Hypertension     Surgical History: Past Surgical History:  Procedure Laterality Date  . CATARACT EXTRACTION, BILATERAL    . DIALYSIS/PERMA CATHETER INSERTION N/A 08/10/2019   Procedure: DIALYSIS/PERMA CATHETER INSERTION;  Surgeon: Algernon Huxley, MD;  Location: [place] [facility];  Service: Cardiovascular;  Laterality: N/A;  . DIALYSIS/PERMA CATHETER REMOVAL N/A 09/24/2019   Procedure: DIALYSIS/PERMA CATHETER REMOVAL;  Surgeon: Algernon Huxley, MD;  Location: [place] [facility];  Service: Cardiovascular;  Laterality: N/A;    Allergies: No Known Allergies  Family History: Family History  Problem Relation Age of Onset  . Mitral valve prolapse Mother   . Heart disease Father     Social History:  reports that he quit smoking about 20 years ago. His smoking use included cigarettes. He has never used smokeless tobacco. He reports current alcohol use of about 1.0 standard drinks of alcohol per week. He reports that he does not use drugs.  ROS: Negative aside from those stated in the HPI.  Physical Exam: BP 112/69 (BP Location: Left Arm)   Pulse 98   Temp  98.9 F (37.2 C)   Resp 18   Ht 6' (1.829 m)   Wt 86.6 kg   SpO2 97%   BMI 25.90 kg/m    Constitutional:  Alert and oriented, No acute distress.  Disheveled and chronically ill-appearing Cardiovascular: No clubbing, cyanosis, or edema. Respiratory: Normal respiratory effort, no increased work of breathing. GI:  Abdomen is soft, nontender, nondistended, no abdominal masses GU: Mild left CVA tenderness, no right CVA tenderness.  Circumcised phallus with widely patent meatus.  Laboratory Data: Reviewed in epic, see HPI  Pertinent Imaging: I have personally reviewed the CT stone protocol from the [facility].  This is notable for significant air in the bladder and collecting systems bilaterally, as well as mild left hydronephrosis with significant left perinephric stranding and edema.  There is no obstructing stones noted.  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  His baseline renal function is creatinine of 3-4 and he has been off dialysis for the last few months.  Urinalysis concerning for infection, blood culture PCR showing E. 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  Possible etiologies include ascending infection in the setting of his poorly controlled diabetes or colovesical/enterovesical fistula.  He is currently afebrile and hemodynamically stable.   We discussed the treatment options for emphysematous pyelitis range from antibiotics and conservative management, to need for drainage with a percutaneous nephrostomy tube, versus historically even nephrectomy in rare cases.  I again recommended a Foley catheter to maximally drain his infected system, but he refused.  Recommendations: -Continue broad-spectrum antibiotics, narrow as able pending culture data.  Recommend 2-week course total -If persistent fevers >48 hours or not clinically improving would recommend re-imaging with either renal ultrasound or repeat CT without contrast to evaluate for renal abscess versus hydronephrosis that would require  percutaneous nephrostomy tube versus percutaneous drain by interventional radiology -Close DM control -Consider follow-up imaging with CT abdomen pelvis with oral contrast to evaluate for bowel/urologic fistula -Call if questions  Billey Co, MD  Total time spent on the floor was 90 minutes, with greater than 50% spent in counseling and coordination of care with the patient regarding bilateral emphysematous pyelitis and possible etiologies as well as treatment strategies.  [facility] [address] [place], [place] [address] [phone]

## 2019-11-17 NOTE — H&P (Signed)
[facility] [place] NAME: Austin Goodwin    MR#:  [id number]  DATE OF BIRTH:  1951-04-01  DATE OF ADMISSION:  11/17/2019  PRIMARY CARE PHYSICIAN: Reynold Bowen, MD   REQUESTING/REFERRING PHYSICIAN:  Will Bonnet, MD [facility] ER) CHIEF COMPLAINT:  Fever, left flank pain  HISTORY OF PRESENT ILLNESS:  Austin Goodwin  is a 69 y.o. male with a known history of type II diabetes mellitus and hypertension, chronic kidney disease, status post hemodialysis for 2 months and then removal of dialysis catheter, who presented to [facility] ER with acute onset of fever and severe left flank pain.  The patient admits to urinary frequency and urgency without significant dysuria.  He had nausea and vomiting after dinner last night and admits to left lower quadrant abdominal pain he denied any bilious vomitus or hematemesis.  No diarrhea or constipation.  He denied any cough or dyspnea or wheezing.  No chest pain or palpitations.  No COVID-19 exposure.  When he came to the ER his blood pressure was 187/78 with a heart rate of 102 and temperature was 101 and reportedly is cultures been as high as 103).  97% on room air with respiratory rate of 18.  CBC showed Total of 17.8 with neutrophilia of 88.5% and hemoglobin was 11.4 with hematocrit of 35.9 and platelets 163.  CMP revealed a sodium of 139, potassium 4.4, chloride 106, anion gap 10, CO2 23, BUN 49 and creatinine 3.1, glucose 385, calcium 9.1, albumin 4, total protein 6.8, total bili 0.5, AST of 24, ALT 18 alk phos 76.  Serum lipase was 164.  He had a spiral abdominal and pelvic CT scan that revealed mild left hydronephrosis with diffuse left perinephric stranding and edema with air in the renal collecting system bilaterally as well as within the urinary bladder that may be related to recent instrumentation or infection with gas-forming agent.  The patient denied any recent instrumentation or catheterization.  It showed no  evidence for urolithiasis.  His appendix was normal and there was no bowel obstruction.  It showed cholelithiasis and aortic atherosclerosis.  He was given 650 mg p.o. Tylenol, 4 mg of IV morphine sulfate and 4 mg of IV Zofran, 1 L bolus of IV normal saline, IV vancomycin and Zosyn, another 500 mL of IV normal saline bolus and 650 mg of p.o. Tylenol, 0.5 mg of IV Ativan was placed on maintenance hydration with IV normal saline at 125 mL/h.  The patient has been following with his nephrologist here at [facility] specially while having hemodialysis in the recent past.  He was initially accepted for transfer at [facility] but the patient preferred to come to [facility].  I discussed his case with Dr. Livia Snellen and recommended urology approval.  She discussed the case with Dr. Caprice Beaver who accepted to see the patient here.  Upon arrival to the floor here at [facility], blood pressure was 118/72 with a heart rate of 99 respiratory rate of 20 and temperature of 98.7.  The patient is directly admitted to the medical monitored bed for further evaluation and management. PAST MEDICAL HISTORY:   Past Medical History:  Diagnosis Date  . Diabetes mellitus without complication ([place])   . Hypertension   -Dyslipidemia -Chronic kidney disease (plasma cell rich acute tubulointerstitial nephritis) status post 2 months of hemodialysis in early 2021 -TIA PAST SURGICAL HISTORY:   Past Surgical History:  Procedure Laterality Date  . CATARACT EXTRACTION, BILATERAL    .  DIALYSIS/PERMA CATHETER INSERTION N/A 08/10/2019   Procedure: DIALYSIS/PERMA CATHETER INSERTION;  Surgeon: Algernon Huxley, MD;  Location: [place] [facility];  Service: Cardiovascular;  Laterality: N/A;  . DIALYSIS/PERMA CATHETER REMOVAL N/A 09/24/2019   Procedure: DIALYSIS/PERMA CATHETER REMOVAL;  Surgeon: Algernon Huxley, MD;  Location: [place] [facility];  Service: Cardiovascular;  Laterality: N/A;    SOCIAL HISTORY:   Social History   Tobacco Use  . Smoking  status: Former Smoker    Types: Cigarettes    Quit date: 07/27/1999    Years since quitting: 20.3  . Smokeless tobacco: Never Used  Substance Use Topics  . Alcohol use: Yes    Alcohol/week: 1.0 standard drinks    Types: 1 Cans of beer per week    Comment: occasionally, Last drink 1 beer 11/15/19    FAMILY HISTORY:   Family History  Problem Relation Age of Onset  . Mitral valve prolapse Mother   . Heart disease Father     DRUG ALLERGIES:  No Known Allergies  REVIEW OF SYSTEMS:   ROS As per history of present illness. All pertinent systems were reviewed above. Constitutional,  HEENT, cardiovascular, respiratory, GI, GU, musculoskeletal, neuro, psychiatric, endocrine,  integumentary and hematologic systems were reviewed and are otherwise  negative/unremarkable except for positive findings mentioned above in the HPI.   MEDICATIONS AT HOME:   Prior to Admission medications   Medication Sig Start Date End Date Taking? Authorizing Provider  amLODipine (NORVASC) 10 MG tablet Take 1 tablet (10 mg total) by mouth daily. 07/04/19   Jennye Boroughs, MD  aspirin 81 MG chewable tablet Chew 81 mg by mouth at bedtime.    [provider]  glipiZIDE (GLUCOTROL) 10 MG tablet Take 10 mg by mouth 2 (two) times daily before a meal.    [provider]  Multiple Vitamin (MULTIVITAMIN) tablet Take 1 tablet by mouth daily.    [provider]  rosuvastatin (CRESTOR) 10 MG tablet Take 10 mg by mouth once a week. Wednesday    [provider]  traMADol (ULTRAM) 50 MG tablet Take 50 mg by mouth 2 (two) times daily as needed for pain. 06/18/19   [provider]  vitamin B-12 (CYANOCOBALAMIN) 1000 MCG tablet Take 1 tablet (1,000 mcg total) by mouth daily. 08/08/19   Orson Slick, MD      VITAL SIGNS:  Blood pressure 118/72, pulse 99, temperature 98.7 F (37.1 C), temperature source Oral, resp. rate 20.  PHYSICAL EXAMINATION:  Physical Exam  GENERAL:  69  y.o.-year-old patient lying in the bed with no acute distress.  EYES: Pupils equal, round, reactive to light and accommodation. No scleral icterus. Extraocular muscles intact.  HEENT: Head atraumatic, normocephalic. Oropharynx and nasopharynx clear.  NECK:  Supple, no jugular venous distention. No thyroid enlargement, no tenderness.  LUNGS: Normal breath sounds bilaterally, no wheezing, rales,rhonchi or crepitation. No use of accessory muscles of respiration.  CARDIOVASCULAR: Regular rate and rhythm, S1, S2 normal. No murmurs, rubs, or gallops.  ABDOMEN: Soft, nondistended, nontender. Bowel sounds present. No organomegaly or mass.  EXTREMITIES: No pedal edema, cyanosis, or clubbing.  NEUROLOGIC: Cranial nerves II through XII are intact. Muscle strength 5/5 in all extremities. Sensation intact. Gait not checked.  PSYCHIATRIC: The patient is alert and oriented x 3.  Normal affect and good eye contact. SKIN: No obvious rash, lesion, or ulcer.   LABORATORY PANEL:   CBC No results for input(s): WBC, HGB, HCT, PLT in the last 168 hours. ------------------------------------------------------------------------------------------------------------------  Chemistries  No results for input(s): NA, K, CL, CO2, GLUCOSE, BUN, CREATININE, CALCIUM, MG, AST, ALT, ALKPHOS, BILITOT in the last 168 hours.  Invalid input(s): GFRCGP ------------------------------------------------------------------------------------------------------------------  Cardiac Enzymes No results for input(s): TROPONINI in the last 168 hours. ------------------------------------------------------------------------------------------------------------------  RADIOLOGY:  No results found.    IMPRESSION AND PLAN:   1.  Acute left pyelonephritis with associated left hydronephrosis. -The patient is directly admitted to a medical monitored bed. -We will continue antibiotic therapy with IV Rocephin. -We will continue hydration with  IV normal saline. -A urology consultation will be obtained this a.m. -Dr. Caprice Beaver was notified and is aware about the patient. -I kept the patient n.p.o. for now pending urologic evaluation.  2.  Sepsis secondary to UTI and pyelonephritis.  This is manifested by leukocytosis, fever and tachycardia.  No evidence for severe sepsis or septic shock. -We will follow urine and blood cultures. -We will continue him on IV Rocephin. -We will follow CBC.  3.  Uncontrolled type 2 diabetes mellitus with hyperglycemia. -This likely secondary to #1 and #2. -The patient will be placed on supplemental coverage with NovoLog. -His glipizide can be held off when he is n.p.o.  4.  Hypertensive urgency. -This could have been related to pain. -His blood pressure is  much better here. -Continue his amlodipine and hold off his lisinopril pending further renal functions.  5.  Stage IV-V chronic kidney disease status post 2 months of hemodialysis. -We will obtain a follow-up nephrology consultation. -I notified Dr. Holley Raring about the patient. -We will follow renal functions with hydration.  6.  Dyslipidemia. -We will continue statin therapy.  7.  Vitamin B12 deficiency. -We will continue his vitamin B12.  8.  DVT prophylaxis. -Subcutaneous Lovenox  All the records are reviewed and case discussed with ED provider. The plan of care was discussed in details with the patient (and family). I answered all questions. The patient agreed to proceed with the above mentioned plan. Further management will depend upon hospital course.   CODE STATUS: Full code  Status is: Inpatient  Remains inpatient appropriate because:Ongoing diagnostic testing needed not appropriate for outpatient work up, Unsafe d/c plan, IV treatments appropriate due to intensity of illness or inability to take PO and Inpatient level of care appropriate due to severity of illness   Dispo: The patient is from: Home              Anticipated  d/c is to: Home              Anticipated d/c date is: 2 days              Patient currently is not medically stable to d/c.  TOTAL TIME TAKING CARE OF THIS PATIENT:55 minutes.    Christel Mormon M.D on 11/17/2019 at 5:15 AM  [facility]   From 7 PM-7 AM, contact night-coverage [email or web address]  CC: Primary care physician; Reynold Bowen, MD   Note: This dictation was prepared with Dragon dictation along with smaller phrase technology. Any transcriptional errors that result from this process are unintentional.

## 2019-11-18 DIAGNOSIS — N12 Tubulo-interstitial nephritis, not specified as acute or chronic: Secondary | ICD-10-CM | POA: Diagnosis not present

## 2019-11-18 DIAGNOSIS — N179 Acute kidney failure, unspecified: Secondary | ICD-10-CM | POA: Diagnosis not present

## 2019-11-18 DIAGNOSIS — N10 Acute tubulo-interstitial nephritis: Secondary | ICD-10-CM | POA: Diagnosis not present

## 2019-11-18 DIAGNOSIS — R7881 Bacteremia: Secondary | ICD-10-CM

## 2019-11-18 DIAGNOSIS — B957 Other staphylococcus as the cause of diseases classified elsewhere: Secondary | ICD-10-CM

## 2019-11-18 DIAGNOSIS — I10 Essential (primary) hypertension: Secondary | ICD-10-CM | POA: Diagnosis not present

## 2019-11-18 LAB — CBC WITH DIFFERENTIAL/PLATELET
Abs Immature Granulocytes: 0.26 10*3/uL — ABNORMAL HIGH (ref 0.00–0.07)
Basophils Absolute: 0 10*3/uL (ref 0.0–0.1)
Basophils Relative: 0 %
Eosinophils Absolute: 0 10*3/uL (ref 0.0–0.5)
Eosinophils Relative: 0 %
HCT: 28.9 % — ABNORMAL LOW (ref 39.0–52.0)
Hemoglobin: 9.2 g/dL — ABNORMAL LOW (ref 13.0–17.0)
Immature Granulocytes: 2 %
Lymphocytes Relative: 5 %
Lymphs Abs: 0.7 10*3/uL (ref 0.7–4.0)
MCH: 33.7 pg (ref 26.0–34.0)
MCHC: 31.8 g/dL (ref 30.0–36.0)
MCV: 105.9 fL — ABNORMAL HIGH (ref 80.0–100.0)
Monocytes Absolute: 0.6 10*3/uL (ref 0.1–1.0)
Monocytes Relative: 4 %
Neutro Abs: 12.2 10*3/uL — ABNORMAL HIGH (ref 1.7–7.7)
Neutrophils Relative %: 89 %
Platelets: 114 10*3/uL — ABNORMAL LOW (ref 150–400)
RBC: 2.73 MIL/uL — ABNORMAL LOW (ref 4.22–5.81)
RDW: 14.8 % (ref 11.5–15.5)
WBC Morphology: INCREASED
WBC: 13.8 10*3/uL — ABNORMAL HIGH (ref 4.0–10.5)
nRBC: 0 % (ref 0.0–0.2)

## 2019-11-18 LAB — GLUCOSE, CAPILLARY
Glucose-Capillary: 182 mg/dL — ABNORMAL HIGH (ref 70–99)
Glucose-Capillary: 170 mg/dL — ABNORMAL HIGH (ref 70–99)
Glucose-Capillary: 99 mg/dL (ref 70–99)
Glucose-Capillary: 105 mg/dL — ABNORMAL HIGH (ref 70–99)

## 2019-11-18 LAB — BASIC METABOLIC PANEL
Anion gap: 6 (ref 5–15)
BUN: 57 mg/dL — ABNORMAL HIGH (ref 8–23)
CO2: 21 mmol/L — ABNORMAL LOW (ref 22–32)
Calcium: 7.5 mg/dL — ABNORMAL LOW (ref 8.9–10.3)
Chloride: 109 mmol/L (ref 98–111)
Creatinine, Ser: 4.46 mg/dL — ABNORMAL HIGH (ref 0.61–1.24)
GFR calc Af Amer: 15 mL/min — ABNORMAL LOW (ref >60)
GFR calc non Af Amer: 13 mL/min — ABNORMAL LOW (ref >60)
Glucose, Bld: 193 mg/dL — ABNORMAL HIGH (ref 70–99)
Potassium: 4.4 mmol/L (ref 3.5–5.1)
Sodium: 136 mmol/L (ref 135–145)

## 2019-11-18 MED ORDER — INSULIN ASPART 100 UNIT/ML ~~LOC~~ SOLN
0.0000 [IU] | Freq: Three times a day (TID) | SUBCUTANEOUS | Status: DC
  Administered 2019-11-18: 2 [IU] via SUBCUTANEOUS
  Administered 2019-11-19: 1 [IU] via SUBCUTANEOUS
  Administered 2019-11-19: 2 [IU] via SUBCUTANEOUS
  Administered 2019-11-20: 3 [IU] via SUBCUTANEOUS
  Administered 2019-11-20 – 2019-11-21 (×3): 1 [IU] via SUBCUTANEOUS
  Filled 2019-11-18 (×7): qty 1

## 2019-11-18 MED ORDER — INSULIN DETEMIR 100 UNIT/ML ~~LOC~~ SOLN
5.0000 [IU] | Freq: Two times a day (BID) | SUBCUTANEOUS | Status: DC
  Administered 2019-11-18 – 2019-11-21 (×6): 5 [IU] via SUBCUTANEOUS
  Filled 2019-11-18 (×8): qty 0.05

## 2019-11-18 MED ORDER — INSULIN ASPART 100 UNIT/ML ~~LOC~~ SOLN
3.0000 [IU] | Freq: Three times a day (TID) | SUBCUTANEOUS | Status: DC
  Administered 2019-11-18 – 2019-11-21 (×9): 3 [IU] via SUBCUTANEOUS
  Filled 2019-11-18 (×10): qty 1

## 2019-11-18 MED ORDER — INSULIN ASPART 100 UNIT/ML ~~LOC~~ SOLN: 0.0000 [IU] | Freq: Every day | SUBCUTANEOUS | Status: DC

## 2019-11-18 MED ORDER — PANTOPRAZOLE SODIUM 40 MG PO TBEC
40.0000 mg | DELAYED_RELEASE_TABLET | Freq: Two times a day (BID) | ORAL | Status: DC
  Administered 2019-11-18 – 2019-11-21 (×7): 40 mg via ORAL
  Filled 2019-11-18 (×9): qty 1

## 2019-11-18 NOTE — Progress Notes (Signed)
[facility] PROGRESS NOTE    Progress Note  Austin Goodwin  GYI:[id number] DOB: 1950/08/11 DOA: 11/17/2019 PCP: Reynold Bowen, MD     Brief Narrative:   [facility] is an 69 y.o. male past medical history of diabetes mellitus type 2, hypertension chronic kidney disease to which at one point he needed hemodialysis for 2 months and then renal function return she status post removal of a dialysis catheter who presented [facility] with acute onset of fever and severe flank pain was found to be febrile with a white count of 18 left shift.  She had an abdominal CT that revealed hydronephrosis with diffuse left perinephric stranding  Assessment/Plan:    Sepsis secondary to UTI ([place]) 2/2 to acute pyelonephritis and bacteremia due to staph epidermidis: He was started empirically on IV Rocephin blood cultures and urine cultures from [place] grew staph epidermidis. He has remained afebrile leukocytosis improving on IV Rocephin and he relates he feels better. He was consulted recommended to continue to treat conservatively.  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  Hyperkalemia: Likely due to acute renal failure he was given Kayexalate had 3-4 bowel movements, basic metabolic panel is pending.  Uncontrolled diabetes mellitus type 2 with hyperglycemia: Likely secondary to #1. Fairly controlled continue long-acting insulin plus sliding scale.  Hypertensive urgency/essential HTN: Likely due to pain, continue to hold lisinopril continue hydralazine IV as needed.  Dyslipidemia: Continue statin therapy.  Vitamin B12 deficiency: Continue supplementation.    DVT prophylaxis: lovenox Family Communication:none Status is: Inpatient  Remains inpatient appropriate because:Hemodynamically unstable   Dispo: The patient is from: Home            Anticipated d/c is to: Home              Anticipated d/c date is: 3 days              Patient currently is not medically stable to d/c.   Code Status:     Code Status Orders  (From admission, onward)         Start     Ordered   11/17/19 0512  Full code  Continuous     11/17/19 0513        Code Status History    Date Active Date Inactive Code Status Order ID Comments User Context   07/02/2019 1922 07/04/2019 1910 Full Code [id number]  Orene Desanctis, DO ED   06/30/2019 0105 06/30/2019 1135 Full Code [id number]  Pearlean Brownie, MD ED   Advance Care Planning Activity        IV Access:    Peripheral IV   Procedures and diagnostic studies:   No results found.   Medical Consultants:    None.  Anti-Infectives:   IV Rocephin  Subjective:    Austin Goodwin relates his pain is improved  Objective:    Vitals:   11/17/19 0805 11/17/19 1712 11/17/19 2356 11/18/19 0727  BP: 112/69 (!) 144/72 (!) 143/69 (!) 143/71  Pulse: 98 (!) 108 (!) 104 93  Resp: 18 18 16 17   Temp: 98.9 F (37.2 C) 98.4 F (36.9 C) 98.8 F (37.1 C) 98.7 F (37.1 C)  TempSrc:   Oral Oral  SpO2: 97% 100% 98% 97%  Weight:      Height:       SpO2: 97 %  Intake/Output Summary (Last 24 hours) at 11/18/2019 0944 Last data filed at 11/18/2019 0300 Gross per 24 hour  Intake 1909.96 ml  Output --  Net 1909.96 ml   Filed Weights   11/17/19 0510  Weight: 86.6 kg    Exam: General exam: In no acute distress. Respiratory system: Good air movement and clear to auscultation. Cardiovascular system: S1 & S2 heard, RRR. No JVD. Gastrointestinal system: Abdomen is nondistended, soft and nontender.  Extremities: No pedal edema. Skin: No rashes, lesions or ulcers  Data Reviewed:    Labs: Basic Metabolic Panel: Recent Labs  Lab 11/17/19 0708  NA 136  K 5.2*  CL 106  CO2 20*  GLUCOSE 166*  BUN 53*  CREATININE 4.13*  CALCIUM 8.0*   GFR Estimated Creatinine Clearance: 18.5  mL/min (A) (by C-G formula based on SCr of 4.13 mg/dL (H)). Liver Function Tests: No results for input(s): AST, ALT, ALKPHOS, BILITOT, PROT, ALBUMIN in the last 168 hours. No results for input(s): LIPASE, AMYLASE in the last 168 hours. No results for input(s): AMMONIA in the last 168 hours. Coagulation profile Recent Labs  Lab 11/17/19 0708  INR 1.3*   COVID-19 Labs  No results for input(s): DDIMER, FERRITIN, LDH, CRP in the last 72 hours.  Lab Results  Component Value Date   SARSCOV2NAA NEGATIVE 11/17/2019   [place] NEGATIVE 09/20/2019   [employer] NEGATIVE 08/08/2019   [place] NEGATIVE 07/02/2019    CBC: Recent Labs  Lab 11/17/19 0708 11/18/19 0451  WBC 20.3* 13.8*  NEUTROABS 18.2* 12.2*  HGB 10.3* 9.2*  HCT 32.6* 28.9*  MCV 105.2* 105.9*  PLT 146* 114*   Cardiac Enzymes: No results for input(s): CKTOTAL, CKMB, CKMBINDEX, TROPONINI in the last 168 hours. BNP (last 3 results) No results for input(s): PROBNP in the last 8760 hours. CBG: Recent Labs  Lab 11/17/19 0802 11/17/19 1203 11/17/19 1710 11/17/19 2300 11/18/19 0516  GLUCAP 154* 179* 169* 89 182*   D-Dimer: No results for input(s): DDIMER in the last 72 hours. Hgb A1c: Recent Labs    11/17/19 0708  HGBA1C 6.5*   Lipid Profile: No results for input(s): CHOL, HDL, LDLCALC, TRIG, CHOLHDL, LDLDIRECT in the last 72 hours. Thyroid function studies: No results for input(s): TSH, T4TOTAL, T3FREE, THYROIDAB in the last 72 hours.  Invalid input(s): FREET3 Anemia work up: No results for input(s): VITAMINB12, FOLATE, FERRITIN, TIBC, IRON, RETICCTPCT in the last 72 hours. Sepsis Labs: Recent Labs  Lab 11/17/19 0708 11/18/19 0451  WBC 20.3* 13.8*   Microbiology Recent Results (from the past 240 hour(s))  SARS CORONAVIRUS 2 (TAT 6-24 HRS) Nasopharyngeal Nasopharyngeal Swab     Status: None   Collection Time: 11/17/19 10:42 AM   Specimen: Nasopharyngeal Swab  Result Value Ref Range Status    SARS Coronavirus 2 NEGATIVE NEGATIVE Final    Comment: (NOTE) SARS-CoV-2 target nucleic acids are NOT DETECTED. The SARS-CoV-2 RNA is generally detectable in upper and lower respiratory specimens during the acute phase of infection. Negative results do not preclude SARS-CoV-2 infection, do not rule out co-infections with other pathogens, and should not be used as the sole basis for treatment or other patient management decisions. Negative results must be combined with clinical observations, patient history, and epidemiological information. The expected result is Negative. Fact Sheet for Patients: SugarRoll.be Fact Sheet for Healthcare Providers: https://www.woods-mathews.com/ This test is not yet approved or cleared by the [place] FDA and  has been authorized for detection and/or diagnosis of SARS-CoV-2 by FDA under an Emergency  Use Authorization (EUA). This EUA will remain  in effect (meaning this test can be used) for the duration of the COVID-19 declaration under Section 56 4(b)(1) of the Act, 21 U.S.C. section 360bbb-3(b)(1), unless the authorization is terminated or revoked sooner. Performed at [facility], [place] [address], [place], [place] [address]      Medications:   . amLODipine  10 mg Oral Daily  . enoxaparin (LOVENOX) injection  30 mg Subcutaneous Q24H  . insulin aspart  0-9 Units Subcutaneous Q6H  . insulin detemir  5 Units Subcutaneous Daily  . multivitamin with minerals  1 tablet Oral Daily  . [START ON 11/21/2019] rosuvastatin  10 mg Oral Weekly  . vitamin B-12  1,000 mcg Oral Daily   Continuous Infusions: . sodium chloride 100 mL/hr at 11/18/19 0300  . cefTRIAXone (ROCEPHIN)  IV 2 g (11/18/19 6045)      LOS: 1 day   Charlynne Cousins  [facility]  11/18/2019, 9:44 AM

## 2019-11-19 ENCOUNTER — Inpatient Hospital Stay: Payer: Medicare Other

## 2019-11-19 DIAGNOSIS — N179 Acute kidney failure, unspecified: Secondary | ICD-10-CM | POA: Diagnosis not present

## 2019-11-19 DIAGNOSIS — N10 Acute tubulo-interstitial nephritis: Secondary | ICD-10-CM | POA: Diagnosis not present

## 2019-11-19 DIAGNOSIS — N12 Tubulo-interstitial nephritis, not specified as acute or chronic: Secondary | ICD-10-CM | POA: Diagnosis not present

## 2019-11-19 DIAGNOSIS — I10 Essential (primary) hypertension: Secondary | ICD-10-CM | POA: Diagnosis not present

## 2019-11-19 LAB — CBC WITH DIFFERENTIAL/PLATELET
Abs Immature Granulocytes: 0.13 10*3/uL — ABNORMAL HIGH (ref 0.00–0.07)
Basophils Absolute: 0 10*3/uL (ref 0.0–0.1)
Basophils Relative: 0 %
Eosinophils Absolute: 0 10*3/uL (ref 0.0–0.5)
Eosinophils Relative: 0 %
HCT: 27.9 % — ABNORMAL LOW (ref 39.0–52.0)
Hemoglobin: 9.2 g/dL — ABNORMAL LOW (ref 13.0–17.0)
Immature Granulocytes: 1 %
Lymphocytes Relative: 6 %
Lymphs Abs: 0.8 10*3/uL (ref 0.7–4.0)
MCH: 34.2 pg — ABNORMAL HIGH (ref 26.0–34.0)
MCHC: 33 g/dL (ref 30.0–36.0)
MCV: 103.7 fL — ABNORMAL HIGH (ref 80.0–100.0)
Monocytes Absolute: 0.6 10*3/uL (ref 0.1–1.0)
Monocytes Relative: 5 %
Neutro Abs: 11.4 10*3/uL — ABNORMAL HIGH (ref 1.7–7.7)
Neutrophils Relative %: 88 %
Platelets: 107 10*3/uL — ABNORMAL LOW (ref 150–400)
RBC: 2.69 MIL/uL — ABNORMAL LOW (ref 4.22–5.81)
RDW: 14.5 % (ref 11.5–15.5)
WBC: 13.1 10*3/uL — ABNORMAL HIGH (ref 4.0–10.5)
nRBC: 0 % (ref 0.0–0.2)

## 2019-11-19 LAB — GLUCOSE, CAPILLARY
Glucose-Capillary: 114 mg/dL — ABNORMAL HIGH (ref 70–99)
Glucose-Capillary: 146 mg/dL — ABNORMAL HIGH (ref 70–99)
Glucose-Capillary: 161 mg/dL — ABNORMAL HIGH (ref 70–99)
Glucose-Capillary: 140 mg/dL — ABNORMAL HIGH (ref 70–99)

## 2019-11-19 LAB — BASIC METABOLIC PANEL
Anion gap: 10 (ref 5–15)
BUN: 53 mg/dL — ABNORMAL HIGH (ref 8–23)
CO2: 16 mmol/L — ABNORMAL LOW (ref 22–32)
Calcium: 7.9 mg/dL — ABNORMAL LOW (ref 8.9–10.3)
Chloride: 108 mmol/L (ref 98–111)
Creatinine, Ser: 4.59 mg/dL — ABNORMAL HIGH (ref 0.61–1.24)
GFR calc Af Amer: 14 mL/min — ABNORMAL LOW (ref >60)
GFR calc non Af Amer: 12 mL/min — ABNORMAL LOW (ref >60)
Glucose, Bld: 123 mg/dL — ABNORMAL HIGH (ref 70–99)
Potassium: 4.2 mmol/L (ref 3.5–5.1)
Sodium: 134 mmol/L — ABNORMAL LOW (ref 135–145)

## 2019-11-19 NOTE — Progress Notes (Signed)
[facility]  ROUNDING NOTE   Subjective:   [name] admitted to [facility] on 11/17/2019 for UTI (urinary tract infection) [N39.0] Fever [R50.9] Acute pyelonephritis [N10]  Wife at bedside.  Objective:  Vital signs in last 24 hours:  Temp:  [98.7 F (37.1 C)-99.8 F (37.7 C)] 98.9 F (37.2 C) (04/26 0754) Pulse Rate:  [85-94] 85 (04/26 0754) Resp:  [16-18] 18 (04/26 0754) BP: (150-155)/(73-81) 154/73 (04/26 0754) SpO2:  [96 %-99 %] 99 % (04/26 0754)  Weight change:  Filed Weights   11/17/19 0510  Weight: 86.6 kg    Intake/Output: I/O last 3 completed shifts: In: 2656.6 [P.O.:480; I.V.:2076.6; IV Piggyback:100] Out: 900 [Urine:900]   Intake/Output this shift:  Total I/O In: 240 [P.O.:240] Out: 300 [Urine:300]  Physical Exam: General: NAD, sitting up in bed  Head: Normocephalic, atraumatic. Moist oral mucosal membranes  Eyes: Anicteric, PERRL  Neck: Supple, trachea midline  Lungs:  Clear to auscultation  Heart: Regular rate and rhythm  Abdomen:  +flank tenderness R>L  Extremities:  no peripheral edema.  Neurologic: Nonfocal, moving all four extremities  Skin: No lesions  Access: none    Basic Metabolic Panel: Recent Labs  Lab 11/17/19 0708 11/18/19 0451  NA 136 136  K 5.2* 4.4  CL 106 109  CO2 20* 21*  GLUCOSE 166* 193*  BUN 53* 57*  CREATININE 4.13* 4.46*  CALCIUM 8.0* 7.5*    Liver Function Tests: No results for input(s): AST, ALT, ALKPHOS, BILITOT, PROT, ALBUMIN in the last 168 hours. No results for input(s): LIPASE, AMYLASE in the last 168 hours. No results for input(s): AMMONIA in the last 168 hours.  CBC: Recent Labs  Lab 11/17/19 0708 11/18/19 0451 11/19/19 0602  WBC 20.3* 13.8* 13.1*  NEUTROABS 18.2* 12.2* 11.4*  HGB 10.3* 9.2* 9.2*  HCT 32.6* 28.9* 27.9*  MCV 105.2* 105.9* 103.7*  PLT 146* 114* 107*    Cardiac Enzymes: No results for input(s): CKTOTAL, CKMB, CKMBINDEX, TROPONINI in the last 168  hours.  BNP: Invalid input(s): POCBNP  CBG: Recent Labs  Lab 11/18/19 1149 11/18/19 1632 11/18/19 2124 11/19/19 0755 11/19/19 1153  GLUCAP 170* 99 105* 114* 146*    Microbiology: Results for orders placed or performed during the hospital encounter of 11/17/19  SARS CORONAVIRUS 2 (TAT 6-24 HRS) Nasopharyngeal Nasopharyngeal Swab     Status: None   Collection Time: 11/17/19 10:42 AM   Specimen: Nasopharyngeal Swab  Result Value Ref Range Status   SARS Coronavirus 2 NEGATIVE NEGATIVE Final    Comment: (NOTE) SARS-CoV-2 target nucleic acids are NOT DETECTED. The SARS-CoV-2 RNA is generally detectable in upper and lower respiratory specimens during the acute phase of infection. Negative results do not preclude SARS-CoV-2 infection, do not rule out co-infections with other pathogens, and should not be used as the sole basis for treatment or other patient management decisions. Negative results must be combined with clinical observations, patient history, and epidemiological information. The expected result is Negative. Fact Sheet for Patients: SugarRoll.be Fact Sheet for Healthcare Providers: https://www.woods-mathews.com/ This test is not yet approved or cleared by the [place] FDA and  has been authorized for detection and/or diagnosis of SARS-CoV-2 by FDA under an Emergency Use Authorization (EUA). This EUA will remain  in effect (meaning this test can be used) for the duration of the COVID-19 declaration under Section 56 4(b)(1) of the Act, 21 U.S.C. section 360bbb-3(b)(1), unless the authorization is terminated or revoked sooner. Performed at [facility], [address] [place], [place] [address]     Coagulation Studies: Recent Labs    11/17/19 0708  LABPROT 16.0*  INR 1.3*    Urinalysis: No results for input(s): COLORURINE, LABSPEC, PHURINE, GLUCOSEU, HGBUR, BILIRUBINUR, KETONESUR, PROTEINUR, UROBILINOGEN,  NITRITE, LEUKOCYTESUR in the last 72 hours.  Invalid input(s): APPERANCEUR    Imaging: No results found.   Medications:   . sodium chloride 100 mL/hr at 11/19/19 0909  . cefTRIAXone (ROCEPHIN)  IV 2 g (11/19/19 0913)   . amLODipine  10 mg Oral Daily  . enoxaparin (LOVENOX) injection  30 mg Subcutaneous Q24H  . insulin aspart  0-5 Units Subcutaneous QHS  . insulin aspart  0-9 Units Subcutaneous TID WC  . insulin aspart  3 Units Subcutaneous TID WC  . insulin detemir  5 Units Subcutaneous BID  . multivitamin with minerals  1 tablet Oral Daily  . pantoprazole  40 mg Oral BID  . [START ON 11/21/2019] rosuvastatin  10 mg Oral Weekly  . vitamin B-12  1,000 mcg Oral Daily   acetaminophen **OR** acetaminophen, ondansetron **OR** ondansetron (ZOFRAN) IV, traMADol, traZODone  Assessment/ Plan:  [name] is a 68 y.o. 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   1. Acute renal failure with hyperkalemia on chronic kidney disease stage IV: with history of acute tubulointerstitial nephritis requiring dialysis in the past.  Chronic kidney disease is secondary to acute kidney injury with limited recovery, diabetes and hypertension Acute renal failure secondary to pyelonephritis and obstructive uropathy.  No acute indication for dialysis.   2. Urinary tract infection with septicemia/bacteremia: E. Coli on 4/23 blood cultures - ceftriaxone.  - Appreciate urology input.   3. Hypertension: 154/73. Continue home regimen of amlodipine.   4. Secondary Hyperparathyroidism: PTH 156 on 08/30/19.  Not currently on vitamin D agent or binders.   5. Anemia of chronic kidney disease: hemoglobin 9.2. Macrocytic.    LOS: 2 Favor Kreh 4/26/202112:21 PM

## 2019-11-19 NOTE — Progress Notes (Signed)
[facility] PROGRESS NOTE    Progress Note  Austin Goodwin  HKV:[id number] DOB: Nov 27, 1950 DOA: 11/17/2019 PCP: Reynold Bowen, MD     Brief Narrative:   Austin Goodwin is an 69 y.o. male past medical history of diabetes mellitus type 2, hypertension chronic kidney disease to which at one point he needed hemodialysis for 2 months and then renal function return she status post removal of a dialysis catheter who presented [facility] with acute onset of fever and severe flank pain was found to be febrile with a white count of 18 left shift.  She had an abdominal CT that revealed hydronephrosis with diffuse left perinephric stranding  Assessment/Plan:    Sepsis secondary to UTI ([place]) 2/2 to acute pyelonephritis and bacteremia due to staph epidermidis: He was started empirically on IV Rocephin blood cultures and urine cultures from [place] grew staph epidermidis. He has remained afebrile leukocytosis improving on IV Rocephin and he relates he feels better. He was consulted recommended to continue to treat conservatively. 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 Fairly controlled continue long-acting insulin plus sliding scale.  Hypertensive urgency/essential HTN: Likely due to pain, continue to hold lisinopril continue hydralazine IV as needed.  Dyslipidemia: Continue statin therapy.  Vitamin B12 deficiency: Continue supplementation.    DVT prophylaxis: lovenox Family Communication:none Status is: Inpatient  Remains inpatient appropriate  because:Hemodynamically unstable   Dispo: The patient is from: Home              Anticipated d/c is to: Home              Anticipated d/c date is: 3 days              Patient currently is not medically stable to d/c.   Code Status:     Code Status Orders  (From admission, onward)         Start     Ordered   11/17/19 0512  Full code  Continuous     11/17/19 0513        Code Status History    Date Active Date Inactive Code Status Order ID Comments User Context   07/02/2019 1922 07/04/2019 1910 Full Code [id number]  Orene Desanctis, DO ED   06/30/2019 0105 06/30/2019 1135 Full Code [id number]  Pearlean Brownie, MD ED   Advance Care Planning Activity        IV Access:    Peripheral IV   Procedures and diagnostic studies:   No results found.   Medical Consultants:    None.  Anti-Infectives:   IV Rocephin  Subjective:    Austin Goodwin tolerating his diet he relates he feels great.  Objective:    Vitals:   11/18/19 0727 11/18/19 1631 11/18/19 2317 11/19/19 0754  BP: (!) 143/71 (!) 150/81 (!) 155/73 (!) 154/73  Pulse: 93 94 93 85  Resp: 17 16 17 18   Temp: 98.7 F (37.1 C) 98.7 F (37.1 C) 99.8 F (37.7 C) 98.9 F (37.2 C)  TempSrc: Oral Oral Oral Oral  SpO2: 97% 96% 96% 99%  Weight:  Height:       SpO2: 99 %   Intake/Output Summary (Last 24 hours) at 11/19/2019 1152 Last data filed at 11/19/2019 0900 Gross per 24 hour  Intake 1860.88 ml  Output 1200 ml  Net 660.88 ml   Filed Weights   11/17/19 0510  Weight: 86.6 kg    Exam: General exam: In no acute distress. Respiratory system: Good air movement and clear to auscultation. Cardiovascular system: S1 & S2 heard, RRR. No JVD. Gastrointestinal system: Abdomen is nondistended, soft and nontender.  Extremities: No pedal edema. Skin: No rashes, lesions or ulcers  Data Reviewed:    Labs: Basic Metabolic Panel: Recent Labs  Lab 11/17/19 0708 11/18/19 0451  NA 136 136  K 5.2* 4.4  CL  106 109  CO2 20* 21*  GLUCOSE 166* 193*  BUN 53* 57*  CREATININE 4.13* 4.46*  CALCIUM 8.0* 7.5*   GFR Estimated Creatinine Clearance: 17.2 mL/min (A) (by C-G formula based on SCr of 4.46 mg/dL (H)). Liver Function Tests: No results for input(s): AST, ALT, ALKPHOS, BILITOT, PROT, ALBUMIN in the last 168 hours. No results for input(s): LIPASE, AMYLASE in the last 168 hours. No results for input(s): AMMONIA in the last 168 hours. Coagulation profile Recent Labs  Lab 11/17/19 0708  INR 1.3*   COVID-19 Labs  No results for input(s): DDIMER, FERRITIN, LDH, CRP in the last 72 hours.  Lab Results  Component Value Date   SARSCOV2NAA NEGATIVE 11/17/2019   [place] NEGATIVE 09/20/2019   [place] NEGATIVE 08/08/2019   [place] NEGATIVE 07/02/2019    CBC: Recent Labs  Lab 11/17/19 0708 11/18/19 0451 11/19/19 0602  WBC 20.3* 13.8* 13.1*  NEUTROABS 18.2* 12.2* 11.4*  HGB 10.3* 9.2* 9.2*  HCT 32.6* 28.9* 27.9*  MCV 105.2* 105.9* 103.7*  PLT 146* 114* 107*   Cardiac Enzymes: No results for input(s): CKTOTAL, CKMB, CKMBINDEX, TROPONINI in the last 168 hours. BNP (last 3 results) No results for input(s): PROBNP in the last 8760 hours. CBG: Recent Labs  Lab 11/18/19 0516 11/18/19 1149 11/18/19 1632 11/18/19 2124 11/19/19 0755  GLUCAP 182* 170* 99 105* 114*   D-Dimer: No results for input(s): DDIMER in the last 72 hours. Hgb A1c: Recent Labs    11/17/19 0708  HGBA1C 6.5*   Lipid Profile: No results for input(s): CHOL, HDL, LDLCALC, TRIG, CHOLHDL, LDLDIRECT in the last 72 hours. Thyroid function studies: No results for input(s): TSH, T4TOTAL, T3FREE, THYROIDAB in the last 72 hours.  Invalid input(s): FREET3 Anemia work up: No results for input(s): VITAMINB12, FOLATE, FERRITIN, TIBC, IRON, RETICCTPCT in the last 72 hours. Sepsis Labs: Recent Labs  Lab 11/17/19 0708 11/18/19 0451 11/19/19 0602  WBC 20.3* 13.8* 13.1*   Microbiology Recent Results  (from the past 240 hour(s))  SARS CORONAVIRUS 2 (TAT 6-24 HRS) Nasopharyngeal Nasopharyngeal Swab     Status: None   Collection Time: 11/17/19 10:42 AM   Specimen: Nasopharyngeal Swab  Result Value Ref Range Status   SARS Coronavirus 2 NEGATIVE NEGATIVE Final    Comment: (NOTE) SARS-CoV-2 target nucleic acids are NOT DETECTED. The SARS-CoV-2 RNA is generally detectable in upper and lower respiratory specimens during the acute phase of infection. Negative results do not preclude SARS-CoV-2 infection, do not rule out co-infections with other pathogens, and should not be used as the sole basis for treatment or other patient management decisions. Negative results must be combined with clinical observations, patient history, and epidemiological information. The expected result is Negative. Fact Sheet for Patients: SugarRoll.be Fact  Sheet for Healthcare Providers: https://www.woods-mathews.com/ This test is not yet approved or cleared by the [place] FDA and  has been authorized for detection and/or diagnosis of SARS-CoV-2 by FDA under an Emergency Use Authorization (EUA). This EUA will remain  in effect (meaning this test can be used) for the duration of the COVID-19 declaration under Section 56 4(b)(1) of the Act, 21 U.S.C. section 360bbb-3(b)(1), unless the authorization is terminated or revoked sooner. Performed at [facility], [place] [address], [place], [place] [address]      Medications:   . amLODipine  10 mg Oral Daily  . enoxaparin (LOVENOX) injection  30 mg Subcutaneous Q24H  . insulin aspart  0-5 Units Subcutaneous QHS  . insulin aspart  0-9 Units Subcutaneous TID WC  . insulin aspart  3 Units Subcutaneous TID WC  . insulin detemir  5 Units Subcutaneous BID  . multivitamin with minerals  1 tablet Oral Daily  . pantoprazole  40 mg Oral BID  . [START ON 11/21/2019] rosuvastatin  10 mg Oral Weekly  . vitamin B-12  1,000 mcg  Oral Daily   Continuous Infusions: . sodium chloride 100 mL/hr at 11/19/19 0909  . cefTRIAXone (ROCEPHIN)  IV 2 g (11/19/19 0913)      LOS: 2 days   Austin Goodwin  [facility]  11/19/2019, 11:52 AM

## 2019-11-19 NOTE — Progress Notes (Signed)
Pt ambulated to the bathroom HR up to 140's sustained in the 130-140's for few min. He has been for about 28min in the bed now HR 117. Low grade fever, oral temp 99.9 and some LLQ pain. Tylenol given. Pt has labor breathing with exsertion. Lungs clear. Stopped the IVF. Currently not in distress.  Dr. Aileen Fass notified and requested to d/c IVF.

## 2019-11-20 LAB — RENAL FUNCTION PANEL
Albumin: 2.7 g/dL — ABNORMAL LOW (ref 3.5–5.0)
Anion gap: 9 (ref 5–15)
BUN: 53 mg/dL — ABNORMAL HIGH (ref 8–23)
CO2: 17 mmol/L — ABNORMAL LOW (ref 22–32)
Calcium: 8.2 mg/dL — ABNORMAL LOW (ref 8.9–10.3)
Chloride: 109 mmol/L (ref 98–111)
Creatinine, Ser: 4.72 mg/dL — ABNORMAL HIGH (ref 0.61–1.24)
GFR calc Af Amer: 14 mL/min — ABNORMAL LOW (ref >60)
GFR calc non Af Amer: 12 mL/min — ABNORMAL LOW (ref >60)
Glucose, Bld: 127 mg/dL — ABNORMAL HIGH (ref 70–99)
Phosphorus: 2.8 mg/dL (ref 2.5–4.6)
Potassium: 3.9 mmol/L (ref 3.5–5.1)
Sodium: 135 mmol/L (ref 135–145)

## 2019-11-20 LAB — IRON AND TIBC
Iron: 14 ug/dL — ABNORMAL LOW (ref 45–182)
Saturation Ratios: 7 % — ABNORMAL LOW (ref 17.9–39.5)
TIBC: 195 ug/dL — ABNORMAL LOW (ref 250–450)
UIBC: 181 ug/dL

## 2019-11-20 LAB — FERRITIN: Ferritin: 355 ng/mL — ABNORMAL HIGH (ref 24–336)

## 2019-11-20 LAB — GLUCOSE, CAPILLARY
Glucose-Capillary: 132 mg/dL — ABNORMAL HIGH (ref 70–99)
Glucose-Capillary: 129 mg/dL — ABNORMAL HIGH (ref 70–99)
Glucose-Capillary: 204 mg/dL — ABNORMAL HIGH (ref 70–99)
Glucose-Capillary: 121 mg/dL — ABNORMAL HIGH (ref 70–99)

## 2019-11-20 LAB — FOLATE: Folate: 12 ng/mL (ref >5.9)

## 2019-11-20 LAB — VITAMIN B12: Vitamin B-12: 421 pg/mL (ref 180–914)

## 2019-11-20 MED ORDER — STERILE WATER FOR INJECTION IV SOLN
INTRAVENOUS | Status: AC
  Filled 2019-11-20 (×2): qty 850

## 2019-11-20 MED ORDER — SODIUM CHLORIDE 0.9 % IV SOLN: INTRAVENOUS | Status: DC

## 2019-11-20 MED ORDER — SODIUM CHLORIDE 0.9 % IV SOLN
100.0000 mg | INTRAVENOUS | Status: DC
  Administered 2019-11-20: 100 mg via INTRAVENOUS
  Filled 2019-11-20 (×2): qty 5

## 2019-11-20 MED ORDER — POLYVINYL ALCOHOL 1.4 % OP SOLN
1.0000 [drp] | OPHTHALMIC | Status: DC | PRN
  Administered 2019-11-20: 1 [drp] via OPHTHALMIC
  Filled 2019-11-20: qty 15

## 2019-11-20 NOTE — Progress Notes (Signed)
Urology Inpatient Progress Note  Subjective: Austin Goodwin is a 69 y.o. male with PMH DM 2, hypertension, CKD with baseline creatinine 3-4, and recent AKI due to acute tubulointerstitial nephritis of unclear etiology transferred from OSH on 11/17/2019 with bilateral emphysematous pyelitis and emphysematous cystitis without evidence of obstructing stone or lesion.  Patient refused Foley placement.  Creatinine up today, 4.72.  Overnight temp 37.2-37.9C, VSS.  Blood cultures from outside hospital have since returned positive for E. coli.  Blood and urine cultures from his admission at [facility] are pending.  On antibiotics as below in addition to Vanco/Zosyn at OSH.  Today patient reports no flank pain.  He states he is urinating and feels he is emptying his bladder.  No suprapubic tenderness on deep palpation.  He states he is ready to go home.  Anti-infectives: Anti-infectives (From admission, onward)   Start     Dose/Rate Route Frequency Ordered Stop   11/17/19 1000  cefTRIAXone (ROCEPHIN) 2 g in sodium chloride 0.9 % 100 mL IVPB  Status:  Discontinued     2 g 200 mL/hr over 30 Minutes Intravenous Every 24 hours 11/17/19 0607 11/17/19 0647   11/17/19 0647  cefTRIAXone (ROCEPHIN) 2 g in sodium chloride 0.9 % 100 mL IVPB     2 g 200 mL/hr over 30 Minutes Intravenous Every 24 hours 11/17/19 0647        Current Facility-Administered Medications  Medication Dose Route Frequency Provider Last Rate Last Admin  . 0.9 %  sodium chloride infusion   Intravenous Continuous Charlynne Cousins, MD 10 mL/hr at 11/20/19 0833 New Bag at 11/20/19 4709  . acetaminophen (TYLENOL) tablet 650 mg  650 mg Oral Q6H PRN Mansy, Jan A, MD   650 mg at 11/19/19 1704   Or  . acetaminophen (TYLENOL) suppository 650 mg  650 mg Rectal Q6H PRN Mansy, Jan A, MD      . amLODipine (NORVASC) tablet 10 mg  10 mg Oral Daily Mansy, Jan A, MD   10 mg at 11/20/19 6283  . cefTRIAXone (ROCEPHIN) 2 g in sodium chloride 0.9 % 100 mL  IVPB  2 g Intravenous Q24H Charlynne Cousins, MD 200 mL/hr at 11/20/19 0834 2 g at 11/20/19 0834  . enoxaparin (LOVENOX) injection 30 mg  30 mg Subcutaneous Q24H Dallie Piles, RPH   30 mg at 11/20/19 0830  . insulin aspart (novoLOG) injection 0-5 Units  0-5 Units Subcutaneous QHS Charlynne Cousins, MD      . insulin aspart (novoLOG) injection 0-9 Units  0-9 Units Subcutaneous TID WC Charlynne Cousins, MD   1 Units at 11/20/19 [phone]  . insulin aspart (novoLOG) injection 3 Units  3 Units Subcutaneous TID WC Charlynne Cousins, MD   3 Units at 11/20/19 0825  . insulin detemir (LEVEMIR) injection 5 Units  5 Units Subcutaneous BID Charlynne Cousins, MD   5 Units at 11/20/19 0827  . multivitamin with minerals tablet 1 tablet  1 tablet Oral Daily Mansy, Jan A, MD   1 tablet at 11/20/19 0827  . ondansetron (ZOFRAN) tablet 4 mg  4 mg Oral Q6H PRN Mansy, Jan A, MD       Or  . ondansetron [facility]) injection 4 mg  4 mg Intravenous Q6H PRN Mansy, Jan A, MD   4 mg at 11/17/19 1804  . pantoprazole (PROTONIX) EC tablet 40 mg  40 mg Oral BID Charlynne Cousins, MD   40 mg at 11/20/19 4765  . [  START ON 11/21/2019] rosuvastatin (CRESTOR) tablet 10 mg  10 mg Oral Weekly Mansy, Jan A, MD      . traMADol Veatrice Bourbon) tablet 50 mg  50 mg Oral Q12H PRN Mansy, Jan A, MD   50 mg at 11/19/19 0335  . traZODone (DESYREL) tablet 25 mg  25 mg Oral QHS PRN Mansy, Jan A, MD      . vitamin B-12 (CYANOCOBALAMIN) tablet 1,000 mcg  1,000 mcg Oral Daily Mansy, Jan A, MD   1,000 mcg at 11/20/19 4782   Objective: Vital signs in last 24 hours: Temp:  [99.1 F (37.3 C)-100.2 F (37.9 C)] 99.4 F (37.4 C) (04/27 0737) Pulse Rate:  [97-117] 97 (04/27 0737) Resp:  [17-19] 19 (04/27 0737) BP: (145-160)/(70-85) 154/78 (04/27 0737) SpO2:  [93 %-98 %] 97 % (04/27 0737)  Intake/Output from previous day: 04/26 0701 - 04/27 0700 In: 2561.4 [P.O.:240; I.V.:2221.4; IV Piggyback:100] Out: 475 [Urine:475] Intake/Output this  shift: No intake/output data recorded.  Physical Exam Vitals and nursing note reviewed.  Constitutional:      General: He is not in acute distress.    Appearance: He is not ill-appearing, toxic-appearing or diaphoretic.  HENT:     Head: Normocephalic and atraumatic.  Pulmonary:     Effort: Pulmonary effort is normal. No respiratory distress.  Abdominal:     Palpations: Abdomen is soft.     Tenderness: There is no abdominal tenderness. There is no guarding or rebound.  Skin:    General: Skin is warm and dry.  Neurological:     Mental Status: He is alert and oriented to person, place, and time.  Psychiatric:        Mood and Affect: Mood normal.        Behavior: Behavior normal.    Lab Results:  Recent Labs    11/18/19 0451 11/19/19 0602  WBC 13.8* 13.1*  HGB 9.2* 9.2*  HCT 28.9* 27.9*  PLT 114* 107*   BMET Recent Labs    11/19/19 0602 11/20/19 0645  NA 134* 135  K 4.2 3.9  CL 108 109  CO2 16* 17*  GLUCOSE 123* 127*  BUN 53* 53*  CREATININE 4.59* 4.72*  CALCIUM 7.9* 8.2*   Assessment & Plan: 69 year old comorbid male admitted with bilateral emphysematous pyelitis and emphysematous cystitis on ceftriaxone with outside blood cultures positive for E. Coli. Patient has had low-grade temps overnight without true fever.  Recommendations: -Continue broad-spectrum antibiotics for total of a 2-week course, continue to follow urine and blood cultures and narrow as possible -Consider PVR to ensure appropriate bladder emptying and infection source control given patient refused Foley -If patient develops fever >101.3 C, consider repeat imaging with renal ultrasound or CT without contrast to evaluate for renal abscess versus hydronephrosis  Debroah Loop, PA-C 11/20/2019

## 2019-11-20 NOTE — Progress Notes (Signed)
[facility]  ROUNDING NOTE   Subjective:   Wife at bedside.   Creatinine 4.72 (4.59) UOP 448mL recorded  Objective:  Vital signs in last 24 hours:  Temp:  [99.1 F (37.3 C)-100.2 F (37.9 C)] 99.4 F (37.4 C) (04/27 0737) Pulse Rate:  [97-117] 97 (04/27 0737) Resp:  [17-19] 19 (04/27 0737) BP: (145-160)/(70-85) 154/78 (04/27 0737) SpO2:  [93 %-98 %] 97 % (04/27 0737)  Weight change:  Filed Weights   11/17/19 0510  Weight: 86.6 kg    Intake/Output: I/O last 3 completed shifts: In: 2561.4 [P.O.:240; I.V.:2221.4; IV Piggyback:100] Out: 1375 [UVOZD:6644]   Intake/Output this shift:  Total I/O In: 240 [P.O.:240] Out: -   Physical Exam: General: NAD, sitting up in bed  Head: Normocephalic, atraumatic. Moist oral mucosal membranes  Eyes: Anicteric, PERRL  Neck: Supple, trachea midline  Lungs:  Clear to auscultation  Heart: Regular rate and rhythm  Abdomen:  +flank tenderness R>L  Extremities:  no peripheral edema.  Neurologic: Nonfocal, moving all four extremities  Skin: No lesions  Access: none    Basic Metabolic Panel: Recent Labs  Lab 11/17/19 0708 11/17/19 0708 11/18/19 0451 11/19/19 0602 11/20/19 0645  NA 136  --  136 134* 135  K 5.2*  --  4.4 4.2 3.9  CL 106  --  109 108 109  CO2 20*  --  21* 16* 17*  GLUCOSE 166*  --  193* 123* 127*  BUN 53*  --  57* 53* 53*  CREATININE 4.13*  --  4.46* 4.59* 4.72*  CALCIUM 8.0*   < > 7.5* 7.9* 8.2*  PHOS  --   --   --   --  2.8   < > = values in this interval not displayed.    Liver Function Tests: Recent Labs  Lab 11/20/19 0645  ALBUMIN 2.7*   No results for input(s): LIPASE, AMYLASE in the last 168 hours. No results for input(s): AMMONIA in the last 168 hours.  CBC: Recent Labs  Lab 11/17/19 0708 11/18/19 0451 11/19/19 0602  WBC 20.3* 13.8* 13.1*  NEUTROABS 18.2* 12.2* 11.4*  HGB 10.3* 9.2* 9.2*  HCT 32.6* 28.9* 27.9*  MCV 105.2* 105.9* 103.7*  PLT 146* 114* 107*    Cardiac  Enzymes: No results for input(s): CKTOTAL, CKMB, CKMBINDEX, TROPONINI in the last 168 hours.  BNP: Invalid input(s): POCBNP  CBG: Recent Labs  Lab 11/19/19 1153 11/19/19 1706 11/19/19 2040 11/20/19 0735 11/20/19 1157  GLUCAP 146* 161* 140* 132* 129*    Microbiology: Results for orders placed or performed during the hospital encounter of 11/17/19  SARS CORONAVIRUS 2 (TAT 6-24 HRS) Nasopharyngeal Nasopharyngeal Swab     Status: None   Collection Time: 11/17/19 10:42 AM   Specimen: Nasopharyngeal Swab  Result Value Ref Range Status   SARS Coronavirus 2 NEGATIVE NEGATIVE Final    Comment: (NOTE) SARS-CoV-2 target nucleic acids are NOT DETECTED. The SARS-CoV-2 RNA is generally detectable in upper and lower respiratory specimens during the acute phase of infection. Negative results do not preclude SARS-CoV-2 infection, do not rule out co-infections with other pathogens, and should not be used as the sole basis for treatment or other patient management decisions. Negative results must be combined with clinical observations, patient history, and epidemiological information. The expected result is Negative. Fact Sheet for Patients: SugarRoll.be Fact Sheet for Healthcare Providers: https://www.woods-mathews.com/ This test is not yet approved or cleared by the [place] FDA and  has been authorized for detection and/or diagnosis  of SARS-CoV-2 by FDA under an Emergency Use Authorization (EUA). This EUA will remain  in effect (meaning this test can be used) for the duration of the COVID-19 declaration under Section 56 4(b)(1) of the Act, 21 U.S.C. section 360bbb-3(b)(1), unless the authorization is terminated or revoked sooner. Performed at [facility], [place] [address], [place], [place] [address]   Culture, blood (Routine X 2) w Reflex to ID Panel     Status: None (Preliminary result)   Collection Time: 11/19/19 12:02 PM    Specimen: BLOOD  Result Value Ref Range Status   Specimen Description BLOOD LEFT ANTECUBITAL  Final   Special Requests   Final    BOTTLES DRAWN AEROBIC AND ANAEROBIC Blood Culture adequate volume   Culture   Final    NO GROWTH < 24 HOURS Performed at [facility], [address], [place], [place] [address]    Report Status PENDING  Incomplete  Culture, blood (Routine X 2) w Reflex to ID Panel     Status: None (Preliminary result)   Collection Time: 11/19/19 12:10 PM   Specimen: BLOOD  Result Value Ref Range Status   Specimen Description BLOOD LEFT HAND  Final   Special Requests   Final    BOTTLES DRAWN AEROBIC AND ANAEROBIC Blood Culture adequate volume   Culture   Final    NO GROWTH < 24 HOURS Performed at [facility], [address] [place], [place] [address]    Report Status PENDING  Incomplete    Coagulation Studies: No results for input(s): LABPROT, INR in the last 72 hours.  Urinalysis: No results for input(s): COLORURINE, LABSPEC, PHURINE, GLUCOSEU, HGBUR, BILIRUBINUR, KETONESUR, PROTEINUR, UROBILINOGEN, NITRITE, LEUKOCYTESUR in the last 72 hours.  Invalid input(s): APPERANCEUR    Imaging: DG Chest 1 View  Result Date: 11/19/2019 CLINICAL DATA:  Shortness of breath EXAM: CHEST  1 VIEW COMPARISON:  08/13/2019 FINDINGS: Dialysis catheter is been removed. Cardiac shadow is stable. Mild vascular congestion is noted. Lungs are well aerated bilaterally with mild bibasilar opacities likely representing some atelectasis. Mild edema could not be totally excluded. No bony abnormality is seen. IMPRESSION: Mild vascular congestion and bibasilar opacities as described. Electronically Signed   By: Inez Catalina M.D.   On: 11/19/2019 19:24     Medications:   . cefTRIAXone (ROCEPHIN)  IV 2 g (11/20/19 0834)  . iron sucrose 100 mg (11/20/19 1325)  .  sodium bicarbonate (isotonic) infusion in sterile water     . amLODipine  10 mg Oral Daily  . enoxaparin  (LOVENOX) injection  30 mg Subcutaneous Q24H  . insulin aspart  0-5 Units Subcutaneous QHS  . insulin aspart  0-9 Units Subcutaneous TID WC  . insulin aspart  3 Units Subcutaneous TID WC  . insulin detemir  5 Units Subcutaneous BID  . multivitamin with minerals  1 tablet Oral Daily  . pantoprazole  40 mg Oral BID  . [START ON 11/21/2019] rosuvastatin  10 mg Oral Weekly  . vitamin B-12  1,000 mcg Oral Daily   acetaminophen **OR** acetaminophen, ondansetron **OR** ondansetron (ZOFRAN) IV, traMADol, traZODone  Assessment/ Plan:  [name] is a 69 y.o. 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  Admitted to [facility] on 11/17/2019 for UTI (urinary tract infection) [N39.0] Fever [R50.9] Acute pyelonephritis [N10]  1. Acute renal failure with hyperkalemia on chronic kidney disease stage IV: with history of  acute tubulointerstitial nephritis requiring dialysis in the past.  Chronic kidney disease is secondary to acute kidney injury with limited recovery, diabetes and hypertension Acute renal failure secondary to pyelonephritis and obstructive uropathy.  No acute indication for dialysis. BUN seems to have plateaued.  - start sodium bicarbonate infusion  2. Urinary tract infection with septicemia/bacteremia: E. Coli on 4/23 blood cultures - ceftriaxone.  - Appreciate urology input.   3. Hypertension:  - Continue home regimen of amlodipine.   4. Secondary Hyperparathyroidism: PTH 156 on 08/30/19.  Not currently on vitamin D agent or binders.   5. Anemia of chronic kidney disease: hemoglobin 9.2.    - IV venofer   LOS: 3 Nimo Verastegui 4/27/20211:26 PM

## 2019-11-20 NOTE — Care Management Important Message (Signed)
Important Message  Patient Details  Name: Austin Goodwin MRN: [id number] Date of Birth: 01/19/1951   Medicare Important Message Given:  Yes     [name] 11/20/2019, 11:11 AM

## 2019-11-20 NOTE — Progress Notes (Signed)
PT Cancellation Note  Patient Details Name: Austin Goodwin MRN: [id number] DOB: [birth year]   Cancelled Treatment:    Reason Eval/Treat Not Completed: Upon entering room with nursing present patient reported having no PT needs and declined PT evaluation/services.  Pt/nursing educated that per patient request PT orders will be completed.  Will reassess pt pending a change in status upon receipt of new PT orders.    Heloise Beecham Deno Sida PT, DPT 11/20/19, 1:28 PM

## 2019-11-21 DIAGNOSIS — E785 Hyperlipidemia, unspecified: Secondary | ICD-10-CM

## 2019-11-21 DIAGNOSIS — A4151 Sepsis due to Escherichia coli [E. coli]: Secondary | ICD-10-CM

## 2019-11-21 DIAGNOSIS — N184 Chronic kidney disease, stage 4 (severe): Secondary | ICD-10-CM

## 2019-11-21 DIAGNOSIS — R6520 Severe sepsis without septic shock: Secondary | ICD-10-CM

## 2019-11-21 DIAGNOSIS — E1122 Type 2 diabetes mellitus with diabetic chronic kidney disease: Secondary | ICD-10-CM | POA: Diagnosis not present

## 2019-11-21 DIAGNOSIS — I10 Essential (primary) hypertension: Secondary | ICD-10-CM | POA: Diagnosis not present

## 2019-11-21 DIAGNOSIS — N179 Acute kidney failure, unspecified: Secondary | ICD-10-CM | POA: Diagnosis not present

## 2019-11-21 DIAGNOSIS — N189 Chronic kidney disease, unspecified: Secondary | ICD-10-CM

## 2019-11-21 LAB — URINALYSIS, COMPLETE (UACMP) WITH MICROSCOPIC
Bacteria, UA: NONE SEEN
Bilirubin Urine: NEGATIVE
Glucose, UA: NEGATIVE mg/dL
Ketones, ur: NEGATIVE mg/dL
Nitrite: NEGATIVE
Protein, ur: NEGATIVE mg/dL
Specific Gravity, Urine: 1.006 (ref 1.005–1.030)
Squamous Epithelial / HPF: NONE SEEN (ref 0–5)
pH: 6 (ref 5.0–8.0)

## 2019-11-21 LAB — CBC WITH DIFFERENTIAL/PLATELET
Abs Immature Granulocytes: 0.03 10*3/uL (ref 0.00–0.07)
Basophils Absolute: 0 10*3/uL (ref 0.0–0.1)
Basophils Relative: 0 %
Eosinophils Absolute: 0.1 10*3/uL (ref 0.0–0.5)
Eosinophils Relative: 1 %
HCT: 28 % — ABNORMAL LOW (ref 39.0–52.0)
Hemoglobin: 9.4 g/dL — ABNORMAL LOW (ref 13.0–17.0)
Immature Granulocytes: 0 %
Lymphocytes Relative: 14 %
Lymphs Abs: 0.9 10*3/uL (ref 0.7–4.0)
MCH: 33.1 pg (ref 26.0–34.0)
MCHC: 33.6 g/dL (ref 30.0–36.0)
MCV: 98.6 fL (ref 80.0–100.0)
Monocytes Absolute: 0.7 10*3/uL (ref 0.1–1.0)
Monocytes Relative: 10 %
Neutro Abs: 5 10*3/uL (ref 1.7–7.7)
Neutrophils Relative %: 75 %
Platelets: 105 10*3/uL — ABNORMAL LOW (ref 150–400)
RBC: 2.84 MIL/uL — ABNORMAL LOW (ref 4.22–5.81)
RDW: 13.6 % (ref 11.5–15.5)
WBC: 6.7 10*3/uL (ref 4.0–10.5)
nRBC: 0 % (ref 0.0–0.2)

## 2019-11-21 LAB — RENAL FUNCTION PANEL
Albumin: 2.5 g/dL — ABNORMAL LOW (ref 3.5–5.0)
Anion gap: 10 (ref 5–15)
BUN: 49 mg/dL — ABNORMAL HIGH (ref 8–23)
CO2: 20 mmol/L — ABNORMAL LOW (ref 22–32)
Calcium: 8.1 mg/dL — ABNORMAL LOW (ref 8.9–10.3)
Chloride: 106 mmol/L (ref 98–111)
Creatinine, Ser: 4.68 mg/dL — ABNORMAL HIGH (ref 0.61–1.24)
GFR calc Af Amer: 14 mL/min — ABNORMAL LOW (ref >60)
GFR calc non Af Amer: 12 mL/min — ABNORMAL LOW (ref >60)
Glucose, Bld: 115 mg/dL — ABNORMAL HIGH (ref 70–99)
Phosphorus: 2.4 mg/dL — ABNORMAL LOW (ref 2.5–4.6)
Potassium: 3.7 mmol/L (ref 3.5–5.1)
Sodium: 136 mmol/L (ref 135–145)

## 2019-11-21 LAB — GLUCOSE, CAPILLARY
Glucose-Capillary: 113 mg/dL — ABNORMAL HIGH (ref 70–99)
Glucose-Capillary: 138 mg/dL — ABNORMAL HIGH (ref 70–99)

## 2019-11-21 MED ORDER — SODIUM BICARBONATE 650 MG PO TABS
650.0000 mg | ORAL_TABLET | Freq: Two times a day (BID) | ORAL | 0 refills | Status: AC
Start: 2019-11-21 — End: 2019-12-21

## 2019-11-21 MED ORDER — PANTOPRAZOLE SODIUM 40 MG PO TBEC: 40.0000 mg | DELAYED_RELEASE_TABLET | Freq: Every day | ORAL | 0 refills | Status: DC

## 2019-11-21 MED ORDER — CEPHALEXIN 500 MG PO CAPS: 500.0000 mg | ORAL_CAPSULE | Freq: Two times a day (BID) | ORAL | 0 refills | Status: AC

## 2019-11-21 MED ORDER — SITAGLIPTIN PHOSPHATE 25 MG PO TABS: 25.0000 mg | ORAL_TABLET | Freq: Every day | ORAL | 0 refills | Status: DC

## 2019-11-21 MED ORDER — SODIUM BICARBONATE 650 MG PO TABS
650.0000 mg | ORAL_TABLET | Freq: Two times a day (BID) | ORAL | Status: DC
  Filled 2019-11-21 (×2): qty 1

## 2019-11-21 NOTE — Progress Notes (Signed)
[facility]  ROUNDING NOTE   Subjective:   Wife at bedside.   Creatinine 4.68( 4.72) (4.59) UOP 925  Objective:  Vital signs in last 24 hours:  Temp:  [98.2 F (36.8 C)-99.2 F (37.3 C)] 98.2 F (36.8 C) (04/28 1356) Pulse Rate:  [88-96] 89 (04/28 1356) Resp:  [18] 18 (04/28 1356) BP: (132-155)/(67-76) 132/67 (04/28 1356) SpO2:  [94 %-98 %] 97 % (04/28 1356)  Weight change:  Filed Weights   11/17/19 0510  Weight: 86.6 kg    Intake/Output: I/O last 3 completed shifts: In: 240 [P.O.:240] Out: 1100 [Urine:1100]   Intake/Output this shift:  Total I/O In: 240 [P.O.:240] Out: 300 [Urine:300]  Physical Exam: General: NAD, sitting up in bed  Head: Normocephalic, atraumatic. Moist oral mucosal membranes  Eyes: Anicteric, PERRL  Neck: Supple, trachea midline  Lungs:  Clear to auscultation  Heart: Regular rate and rhythm  Abdomen:  +flank tenderness R>L  Extremities:  no peripheral edema.  Neurologic: Nonfocal, moving all four extremities  Skin: No lesions  Access: none    Basic Metabolic Panel: Recent Labs  Lab 11/17/19 0708 11/17/19 0708 11/18/19 0451 11/18/19 0451 11/19/19 0602 11/20/19 0645 11/21/19 0523  NA 136  --  136  --  134* 135 136  K 5.2*  --  4.4  --  4.2 3.9 3.7  CL 106  --  109  --  108 109 106  CO2 20*  --  21*  --  16* 17* 20*  GLUCOSE 166*  --  193*  --  123* 127* 115*  BUN 53*  --  57*  --  53* 53* 49*  CREATININE 4.13*  --  4.46*  --  4.59* 4.72* 4.68*  CALCIUM 8.0*   < > 7.5*   < > 7.9* 8.2* 8.1*  PHOS  --   --   --   --   --  2.8 2.4*   < > = values in this interval not displayed.    Liver Function Tests: Recent Labs  Lab 11/20/19 0645 11/21/19 0523  ALBUMIN 2.7* 2.5*   No results for input(s): LIPASE, AMYLASE in the last 168 hours. No results for input(s): AMMONIA in the last 168 hours.  CBC: Recent Labs  Lab 11/17/19 0708 11/18/19 0451 11/19/19 0602 11/21/19 0523  WBC 20.3* 13.8* 13.1* 6.7  NEUTROABS  18.2* 12.2* 11.4* 5.0  HGB 10.3* 9.2* 9.2* 9.4*  HCT 32.6* 28.9* 27.9* 28.0*  MCV 105.2* 105.9* 103.7* 98.6  PLT 146* 114* 107* 105*    Cardiac Enzymes: No results for input(s): CKTOTAL, CKMB, CKMBINDEX, TROPONINI in the last 168 hours.  BNP: Invalid input(s): POCBNP  CBG: Recent Labs  Lab 11/20/19 1157 11/20/19 1651 11/20/19 2120 11/21/19 0740 11/21/19 1149  GLUCAP 129* 204* 121* 113* 138*    Microbiology: Results for orders placed or performed during the hospital encounter of 11/17/19  SARS CORONAVIRUS 2 (TAT 6-24 HRS) Nasopharyngeal Nasopharyngeal Swab     Status: None   Collection Time: 11/17/19 10:42 AM   Specimen: Nasopharyngeal Swab  Result Value Ref Range Status   SARS Coronavirus 2 NEGATIVE NEGATIVE Final    Comment: (NOTE) SARS-CoV-2 target nucleic acids are NOT DETECTED. The SARS-CoV-2 RNA is generally detectable in upper and lower respiratory specimens during the acute phase of infection. Negative results do not preclude SARS-CoV-2 infection, do not rule out co-infections with other pathogens, and should not be used as the sole basis for treatment or other patient management decisions. Negative results  must be combined with clinical observations, patient history, and epidemiological information. The expected result is Negative. Fact Sheet for Patients: SugarRoll.be Fact Sheet for Healthcare Providers: https://www.woods-mathews.com/ This test is not yet approved or cleared by the [place] FDA and  has been authorized for detection and/or diagnosis of SARS-CoV-2 by FDA under an Emergency Use Authorization (EUA). This EUA will remain  in effect (meaning this test can be used) for the duration of the COVID-19 declaration under Section 56 4(b)(1) of the Act, 21 U.S.C. section 360bbb-3(b)(1), unless the authorization is terminated or revoked sooner. Performed at [facility], [place] [address] [place],  [place] [address]   Culture, blood (Routine X 2) w Reflex to ID Panel     Status: None (Preliminary result)   Collection Time: 11/19/19 12:02 PM   Specimen: BLOOD  Result Value Ref Range Status   Specimen Description BLOOD LEFT ANTECUBITAL  Final   Special Requests   Final    BOTTLES DRAWN AEROBIC AND ANAEROBIC Blood Culture adequate volume   Culture   Final    NO GROWTH 2 DAYS Performed at [facility], [address] [place], [place] [address]    Report Status PENDING  Incomplete  Culture, blood (Routine X 2) w Reflex to ID Panel     Status: None (Preliminary result)   Collection Time: 11/19/19 12:10 PM   Specimen: BLOOD  Result Value Ref Range Status   Specimen Description BLOOD LEFT HAND  Final   Special Requests   Final    BOTTLES DRAWN AEROBIC AND ANAEROBIC Blood Culture adequate volume   Culture   Final    NO GROWTH 2 DAYS Performed at [facility], [address] [place], [place] [address]    Report Status PENDING  Incomplete    Coagulation Studies: No results for input(s): LABPROT, INR in the last 72 hours.  Urinalysis: Recent Labs    11/21/19 1221  COLORURINE STRAW*  LABSPEC 1.006  PHURINE 6.0  GLUCOSEU NEGATIVE  HGBUR SMALL*  BILIRUBINUR NEGATIVE  KETONESUR NEGATIVE  PROTEINUR NEGATIVE  NITRITE NEGATIVE  LEUKOCYTESUR SMALL*      Imaging: DG Chest 1 View  Result Date: 11/19/2019 CLINICAL DATA:  Shortness of breath EXAM: CHEST  1 VIEW COMPARISON:  08/13/2019 FINDINGS: Dialysis catheter is been removed. Cardiac shadow is stable. Mild vascular congestion is noted. Lungs are well aerated bilaterally with mild bibasilar opacities likely representing some atelectasis. Mild edema could not be totally excluded. No bony abnormality is seen. IMPRESSION: Mild vascular congestion and bibasilar opacities as described. Electronically Signed   By: Inez Catalina M.D.   On: 11/19/2019 19:24     Medications:   . cefTRIAXone (ROCEPHIN)  IV 2 g (11/21/19  0925)  . iron sucrose 100 mg (11/20/19 1325)   . amLODipine  10 mg Oral Daily  . enoxaparin (LOVENOX) injection  30 mg Subcutaneous Q24H  . insulin aspart  0-5 Units Subcutaneous QHS  . insulin aspart  0-9 Units Subcutaneous TID WC  . insulin aspart  3 Units Subcutaneous TID WC  . insulin detemir  5 Units Subcutaneous BID  . multivitamin with minerals  1 tablet Oral Daily  . pantoprazole  40 mg Oral BID  . rosuvastatin  10 mg Oral Weekly  . vitamin B-12  1,000 mcg Oral Daily   acetaminophen **OR** acetaminophen, ondansetron **OR** ondansetron (ZOFRAN) IV, polyvinyl alcohol, traMADol, traZODone  Assessment/ Plan:  [name] is a 69 y.o. white male with diabetes  mellitus type II, hypertension, MGUS, and history of acute tubulointerstial nephritis requiring hemodialysis for several months.  Admitted to [facility] on 11/17/2019 for UTI (urinary tract infection) [N39.0] Fever [R50.9] Acute pyelonephritis [N10]  1. Acute renal failure with hyperkalemia on chronic kidney disease stage IV: with history of acute tubulointerstitial nephritis requiring dialysis in the past.  Chronic kidney disease is secondary to acute kidney injury with limited recovery, diabetes and hypertension Acute renal failure secondary to pyelonephritis and obstructive uropathy.  No acute indication for dialysis. BUN seems to have plateaued.  - PO sodium bicarbonate.   2. Urinary tract infection with septicemia/bacteremia: E. Coli on 4/23 blood cultures - ceftriaxone.  - Appreciate urology input.   3. Hypertension:  - Continue home regimen of amlodipine.   4. Secondary Hyperparathyroidism: PTH 156 on 08/30/19.  Not currently on vitamin D agent or binders.   5. Anemia of chronic kidney disease: hemoglobin 9.4.    - IV venofer   LOS: 4 Chey Cho 4/28/20212:23 PM

## 2019-11-21 NOTE — Progress Notes (Signed)
D: Pt alert and oriented x 4. Pt denies experiencing any pain at this time.  A: Pt and spouse received discharge and medication education/information. Pt belongings were gathered and taken with pt upon discharge.   R: Pt and spouse verbalized understanding of discharge and medication education/information.  Pt escorted to medical mall front lobby by staff via wheelchair where pov is parked

## 2019-11-21 NOTE — Discharge Summary (Signed)
[facility] NAME: Austin Goodwin    MR#:  [id number]  DATE OF BIRTH:  Feb 27, 1951  DATE OF ADMISSION:  11/17/2019 ADMITTING PHYSICIAN: Austin Mormon, MD  DATE OF DISCHARGE: 11/21/2019  PRIMARY CARE PHYSICIAN: Austin Bowen, MD    ADMISSION DIAGNOSIS:  UTI (urinary tract infection) [N39.0] Fever [R50.9] Acute pyelonephritis [N10]  DISCHARGE DIAGNOSIS:  Active Problems:   AKI (acute kidney injury) ([place])   Diabetes mellitus ([place])   Essential hypertension   Acute pyelonephritis   Sepsis secondary to UTI ([place])   Emphysematous pyelonephritis   Coagulase negative Staphylococcus bacteremia   SECONDARY DIAGNOSIS:   Past Medical History:  Diagnosis Date  . Diabetes mellitus without complication ([place])   . Hypertension     HOSPITAL COURSE:   1.  Sepsis with pansensitive E. coli.  Blood cultures were done at [facility].  Repeat blood cultures here are negative.  I sent off a urine analysis and urine culture.  I did not realize that the patient was can be well enough to go home today but since he is doing well I will discharge home.  Patient was on IV Rocephin high-dose while here.  Patient will go home on 9 more days of high-dose p.o. Keflex. 2. Acute kidney injury with hyperkalemia and chronic kidney disease stage IV.  Patient will be discharged home on sodium bicarb.  Patient was on bicarb drip while here.  Creatinine still very elevated 4.68.  Nephrology follow-up as outpatient.  Dr. Juleen Goodwin will set up for labs on Friday. 3.  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  I spoke with the patient about stopping the glipizide which is not a good medication with chronic kidney disease.  The patient does not want to go home on insulin.  I prescribed low-dose sitagliptin. 4.  Essential hypertension on Norvasc 5.  Hyperlipidemia unspecified on Crestor 6.  Vitamin B12 deficiency on B12 supplementation  DISCHARGE CONDITIONS:    Satisfactory  CONSULTS OBTAINED:  Treatment Team:  Billey Co, MD  Nephrology Dr. Juleen Goodwin  DRUG ALLERGIES:  No Known Allergies  DISCHARGE MEDICATIONS:   Allergies as of 11/21/2019   No Known Allergies     Medication List    STOP taking these medications   glipiZIDE 10 MG tablet Commonly known as: GLUCOTROL     TAKE these medications   amLODipine 10 MG tablet Commonly known as: NORVASC Take 1 tablet (10 mg total) by mouth daily.   aspirin 81 MG chewable tablet Chew 81 mg by mouth at bedtime.   cephALEXin 500 MG capsule Commonly known as: KEFLEX Take 1 capsule (500 mg total) by mouth 2 (two) times daily for 9 days.   multivitamin tablet Take 1 tablet by mouth daily.   pantoprazole 40 MG tablet Commonly known as: PROTONIX Take 1 tablet (40 mg total) by mouth daily.   rosuvastatin 10 MG tablet Commonly known as: CRESTOR Take 10 mg by mouth once a week. Wednesday   sitaGLIPtin 25 MG tablet Commonly known as: Januvia Take 1 tablet (25 mg total) by mouth daily.   sodium bicarbonate 650 MG tablet Take 1 tablet (650 mg total) by mouth 2 (two) times daily.   traMADol 50 MG tablet Commonly known as: ULTRAM Take 50 mg by mouth 2 (two) times daily as needed for pain.   vitamin B-12 1000 MCG tablet Commonly known as: CYANOCOBALAMIN Take 1 tablet (1,000 mcg total) by mouth daily.  DISCHARGE INSTRUCTIONS:   Follow-up PMD 5 days Follow-up closely with nephrology as outpatient.  His kidney function will need to be followed quite regularly  If you experience worsening of your admission symptoms, develop shortness of breath, life threatening emergency, suicidal or homicidal thoughts you must seek medical attention immediately by calling 911 or calling your MD immediately  if symptoms less severe.  You Must read complete instructions/literature along with all the possible adverse reactions/side effects for all the Medicines you take and that have been  prescribed to you. Take any new Medicines after you have completely understood and accept all the possible adverse reactions/side effects.   Please note  You were cared for by a hospitalist during your hospital stay. If you have any questions about your discharge medications or the care you received while you were in the hospital after you are discharged, you can call the unit and asked to speak with the hospitalist on call if the hospitalist that took care of you is not available. Once you are discharged, your primary care physician will handle any further medical issues. Please note that NO REFILLS for any discharge medications will be authorized once you are discharged, as it is imperative that you return to your primary care physician (or establish a relationship with a primary care physician if you do not have one) for your aftercare needs so that they can reassess your need for medications and monitor your lab values.    Today   CHIEF COMPLAINT:  No chief complaint on file.   HISTORY OF PRESENT ILLNESS:  Austin Goodwin  is a 69 y.o. male came to hospital with positive blood culture   VITAL SIGNS:  Blood pressure 132/67, pulse 89, temperature 98.2 F (36.8 C), temperature source Oral, resp. rate 18, height 6' (1.829 m), weight 86.6 kg, SpO2 97 %.  I/O:    Intake/Output Summary (Last 24 hours) at 11/21/2019 1424 Last data filed at 11/21/2019 1300 Gross per 24 hour  Intake 240 ml  Output 1225 ml  Net -985 ml    PHYSICAL EXAMINATION:  GENERAL:  69 y.o.-year-old patient lying in the bed with no acute distress.  EYES: Pupils equal, round, reactive to light and accommodation. No scleral icterus. HEENT: Head atraumatic, normocephalic. Oropharynx and nasopharynx clear.   LUNGS: Normal breath sounds bilaterally, no wheezing, rales,rhonchi or crepitation. No use of accessory muscles of respiration.  CARDIOVASCULAR: S1, S2 normal. No murmurs, rubs, or gallops.  ABDOMEN: Soft,  non-tender, non-distended. Bowel sounds present. No organomegaly or mass.  EXTREMITIES: No pedal edema, cyanosis, or clubbing.  NEUROLOGIC: Cranial nerves II through XII are intact. Muscle strength 5/5 in all extremities. Sensation intact. Gait not checked.  PSYCHIATRIC: The patient is alert and oriented x 3.  SKIN: No obvious rash, lesion, or ulcer.   DATA REVIEW:   CBC Recent Labs  Lab 11/21/19 0523  WBC 6.7  HGB 9.4*  HCT 28.0*  PLT 105*    Chemistries  Recent Labs  Lab 11/21/19 0523  NA 136  K 3.7  CL 106  CO2 20*  GLUCOSE 115*  BUN 49*  CREATININE 4.68*  CALCIUM 8.1*     Microbiology Results  Results for orders placed or performed during the hospital encounter of 11/17/19  SARS CORONAVIRUS 2 (TAT 6-24 HRS) Nasopharyngeal Nasopharyngeal Swab     Status: None   Collection Time: 11/17/19 10:42 AM   Specimen: Nasopharyngeal Swab  Result Value Ref Range Status   SARS Coronavirus 2 NEGATIVE NEGATIVE Final  Comment: (NOTE) SARS-CoV-2 target nucleic acids are NOT DETECTED. The SARS-CoV-2 RNA is generally detectable in upper and lower respiratory specimens during the acute phase of infection. Negative results do not preclude SARS-CoV-2 infection, do not rule out co-infections with other pathogens, and should not be used as the sole basis for treatment or other patient management decisions. Negative results must be combined with clinical observations, patient history, and epidemiological information. The expected result is Negative. Fact Sheet for Patients: SugarRoll.be Fact Sheet for Healthcare Providers: https://www.woods-mathews.com/ This test is not yet approved or cleared by the [place] FDA and  has been authorized for detection and/or diagnosis of SARS-CoV-2 by FDA under an Emergency Use Authorization (EUA). This EUA will remain  in effect (meaning this test can be used) for the duration of the COVID-19  declaration under Section 56 4(b)(1) of the Act, 21 U.S.C. section 360bbb-3(b)(1), unless the authorization is terminated or revoked sooner. Performed at [facility], [place] [address] [place], [place] [address]   Culture, blood (Routine X 2) w Reflex to ID Panel     Status: None (Preliminary result)   Collection Time: 11/19/19 12:02 PM   Specimen: BLOOD  Result Value Ref Range Status   Specimen Description BLOOD LEFT ANTECUBITAL  Final   Special Requests   Final    BOTTLES DRAWN AEROBIC AND ANAEROBIC Blood Culture adequate volume   Culture   Final    NO GROWTH 2 DAYS Performed at [facility], [address], [place], [place] [address]    Report Status PENDING  Incomplete  Culture, blood (Routine X 2) w Reflex to ID Panel     Status: None (Preliminary result)   Collection Time: 11/19/19 12:10 PM   Specimen: BLOOD  Result Value Ref Range Status   Specimen Description BLOOD LEFT HAND  Final   Special Requests   Final    BOTTLES DRAWN AEROBIC AND ANAEROBIC Blood Culture adequate volume   Culture   Final    NO GROWTH 2 DAYS Performed at [facility], [address], [place], [place] [address]    Report Status PENDING  Incomplete    RADIOLOGY:  DG Chest 1 View  Result Date: 11/19/2019 CLINICAL DATA:  Shortness of breath EXAM: CHEST  1 VIEW COMPARISON:  08/13/2019 FINDINGS: Dialysis catheter is been removed. Cardiac shadow is stable. Mild vascular congestion is noted. Lungs are well aerated bilaterally with mild bibasilar opacities likely representing some atelectasis. Mild edema could not be totally excluded. No bony abnormality is seen. IMPRESSION: Mild vascular congestion and bibasilar opacities as described. Electronically Signed   By: Inez Catalina M.D.   On: 11/19/2019 19:24    Management plans discussed with the patient, family and nephrology they are in agreement.  CODE STATUS:     Code Status Orders  (From admission, onward)         Start      Ordered   11/17/19 0512  Full code  Continuous     11/17/19 0513        Code Status History    Date Active Date Inactive Code Status Order ID Comments User Context   07/02/2019 1922 07/04/2019 1910 Full Code [id number]  Orene Desanctis, DO ED   06/30/2019 0105 06/30/2019 1135 Full Code [id number]  Pearlean Brownie, MD ED   Advance Care Planning Activity      TOTAL TIME TAKING CARE OF THIS PATIENT: 32 minutes.    Loletha Grayer M.D on 11/21/2019 at  2:24 PM  Between 7am to 6pm - Pager - [phone]  After 6pm go to [email or web address] - password [email or web address]  [facility]  CC: Primary care physician; Austin Bowen, MD

## 2019-11-22 LAB — URINE CULTURE
Culture: NO GROWTH
Special Requests: NORMAL

## 2019-11-24 LAB — CULTURE, BLOOD (ROUTINE X 2)
Culture: NO GROWTH
Culture: NO GROWTH
Special Requests: ADEQUATE
Special Requests: ADEQUATE

## 2019-12-05 DIAGNOSIS — R310 Gross hematuria: Secondary | ICD-10-CM | POA: Diagnosis not present

## 2019-12-05 DIAGNOSIS — N10 Acute tubulo-interstitial nephritis: Secondary | ICD-10-CM | POA: Diagnosis not present

## 2019-12-05 DIAGNOSIS — N179 Acute kidney failure, unspecified: Secondary | ICD-10-CM | POA: Diagnosis not present

## 2019-12-05 DIAGNOSIS — I10 Essential (primary) hypertension: Secondary | ICD-10-CM | POA: Diagnosis not present

## 2019-12-05 DIAGNOSIS — E1122 Type 2 diabetes mellitus with diabetic chronic kidney disease: Secondary | ICD-10-CM | POA: Diagnosis not present

## 2019-12-06 ENCOUNTER — Other Ambulatory Visit: Payer: Self-pay

## 2019-12-06 ENCOUNTER — Encounter: Payer: Self-pay | Admitting: Urology

## 2019-12-06 ENCOUNTER — Ambulatory Visit (INDEPENDENT_AMBULATORY_CARE_PROVIDER_SITE_OTHER): Payer: Medicare Other | Admitting: Urology

## 2019-12-06 VITALS — BP 136/77 | HR 86 | Ht 72.0 in | Wt 185.0 lb

## 2019-12-06 DIAGNOSIS — N184 Chronic kidney disease, stage 4 (severe): Secondary | ICD-10-CM

## 2019-12-06 DIAGNOSIS — R310 Gross hematuria: Secondary | ICD-10-CM | POA: Diagnosis not present

## 2019-12-06 DIAGNOSIS — R972 Elevated prostate specific antigen [PSA]: Secondary | ICD-10-CM | POA: Diagnosis not present

## 2019-12-06 DIAGNOSIS — E1122 Type 2 diabetes mellitus with diabetic chronic kidney disease: Secondary | ICD-10-CM | POA: Diagnosis not present

## 2019-12-06 DIAGNOSIS — N390 Urinary tract infection, site not specified: Secondary | ICD-10-CM

## 2019-12-06 LAB — BLADDER SCAN AMB NON-IMAGING: Scan Result: 82

## 2019-12-06 NOTE — Patient Instructions (Signed)
Cystoscopy Cystoscopy is a procedure that is used to help diagnose and sometimes treat conditions that affect the lower urinary tract. The lower urinary tract includes the bladder and the urethra. The urethra is the tube that drains urine from the bladder. Cystoscopy is done using a thin, tube-shaped instrument with a light and camera at the end (cystoscope). The cystoscope may be hard or flexible, depending on the goal of the procedure. The cystoscope is inserted through the urethra, into the bladder. Cystoscopy may be recommended if you have:  Urinary tract infections that keep coming back.  Blood in the urine (hematuria).  An inability to control when you urinate (urinary incontinence) or an overactive bladder.  Unusual cells found in a urine sample.  A blockage in the urethra, such as a urinary stone.  Painful urination.  An abnormality in the bladder found during an intravenous pyelogram (IVP) or CT scan. Cystoscopy may also be done to remove a sample of tissue to be examined under a microscope (biopsy). What are the risks? Generally, this is a safe procedure. However, problems may occur, including:  Infection.  Bleeding.  What happens during the procedure?  1. You will be given one or more of the following: ? A medicine to numb the area (local anesthetic). 2. The area around the opening of your urethra will be cleaned. 3. The cystoscope will be passed through your urethra into your bladder. 4. Germ-free (sterile) fluid will flow through the cystoscope to fill your bladder. The fluid will stretch your bladder so that your health care provider can clearly examine your bladder walls. 5. Your doctor will look at the urethra and bladder. 6. The cystoscope will be removed The procedure may vary among health care providers  What can I expect after the procedure? After the procedure, it is common to have: 1. Some soreness or pain in your abdomen and urethra. 2. Urinary symptoms.  These include: ? Mild pain or burning when you urinate. Pain should stop within a few minutes after you urinate. This may last for up to 1 week. ? A small amount of blood in your urine for several days. ? Feeling like you need to urinate but producing only a small amount of urine. Follow these instructions at home: General instructions  Return to your normal activities as told by your health care provider.   Do not drive for 24 hours if you were given a sedative during your procedure.  Watch for any blood in your urine. If the amount of blood in your urine increases, call your health care provider.  If a tissue sample was removed for testing (biopsy) during your procedure, it is up to you to get your test results. Ask your health care provider, or the department that is doing the test, when your results will be ready.  Drink enough fluid to keep your urine pale yellow.  Keep all follow-up visits as told by your health care provider. This is important. Contact a health care provider if you:  Have pain that gets worse or does not get better with medicine, especially pain when you urinate.  Have trouble urinating.  Have more blood in your urine. Get help right away if you:  Have blood clots in your urine.  Have abdominal pain.  Have a fever or chills.  Are unable to urinate. Summary  Cystoscopy is a procedure that is used to help diagnose and sometimes treat conditions that affect the lower urinary tract.  Cystoscopy is done using   a thin, tube-shaped instrument with a light and camera at the end.  After the procedure, it is common to have some soreness or pain in your abdomen and urethra.  Watch for any blood in your urine. If the amount of blood in your urine increases, call your health care provider.  If you were prescribed an antibiotic medicine, take it as told by your health care provider. Do not stop taking the antibiotic even if you start to feel better. This  information is not intended to replace advice given to you by your health care provider. Make sure you discuss any questions you have with your health care provider. Document Revised: 07/04/2018 Document Reviewed: 07/04/2018 Elsevier Patient Education  2020 Elsevier Inc.   

## 2019-12-06 NOTE — Progress Notes (Signed)
   12/06/2019 11:12 AM   Austin Goodwin [birth year] [id number]  Reason for visit: Follow up emphysematous pyelonephritis, hematuria  HPI: I saw Austin Goodwin in urology clinic for follow-up. 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 This resolved spontaneously and he has been off dialysis since 2021. I met him originally on 11/16/2019 when he was transferred from [facility] ED to [facility] with fevers and CT scan showing gas in the collecting systems bilaterally as well as in the bladder, and significant left perinephric stranding and mild left hydronephrosis. He had refused Foley catheter placement at that time and denied any urinary symptoms. He had had 3 to 4 weeks of pneumaturia prior to presenting to the hospital. He improved with antibiotics.  He was seen yesterday by Dr. Candiss Norse and nephrology and reported 3 days of gross hematuria, mild dysuria, decreased energy, and low-grade fever at home. A dipstick urinalysis showed large blood, protein, large leukocytes, however microscopic was not performed. He was started on Levaquin which has improved his dysuria, and his hematuria has also improved.  He also reports an episode of painless gross hematuria in the fall 2020 when he was being evaluated for renal failure.  He denies any fevers or flank pain at home.  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  Also of note, he had a PSA checked in February 2021 that was mildly elevated at 6.5.  He does have a family history of prostate cancer in his father and uncle.  DRE today is benign with a 30 g prostate with no masses or nodules.  He is emptying his bladder well with a PVR of 82 mL.  He denies any prior history of diverticulitis.  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  He also had emphysematous pyelonephritis in April 2021 with blood cultures growing pansensitive E. coli.  We discussed his case at length and the need for further investigation of both his elevated PSA, gross hematuria, UTI, and pneumaturia.  We discussed possible etiologies of his UTIs, pneumaturia, gross hematuria including fistula, malignancy, and infection.  We also reviewed the AUA guidelines regarding PSA screening and the need for repeat PSA when his acute UTI is treated, and possible biopsy in the future.  He cannot get contrasted imaging with his CKD, will start with cystoscopy and renal ultrasound.  Would plan to go to the OR if needed for any biopsy and possible retrograde pyelograms to evaluate the upper tracts bilaterally.  RTC for renal US and cystoscopy Continue 7-day course of Levaquin  I spent 30 total minutes on the day of the encounter including pre-visit review of the medical record, face-to-face time with the patient, and post visit ordering of labs/imaging/tests.  Billey Co, [facility] [address], [place], [place] [address] [phone]

## 2019-12-11 ENCOUNTER — Other Ambulatory Visit: Payer: Self-pay | Admitting: Urology

## 2019-12-11 DIAGNOSIS — N1339 Other hydronephrosis: Secondary | ICD-10-CM

## 2019-12-13 DIAGNOSIS — D472 Monoclonal gammopathy: Secondary | ICD-10-CM | POA: Diagnosis not present

## 2019-12-13 DIAGNOSIS — N2889 Other specified disorders of kidney and ureter: Secondary | ICD-10-CM | POA: Diagnosis not present

## 2019-12-13 DIAGNOSIS — R310 Gross hematuria: Secondary | ICD-10-CM | POA: Diagnosis not present

## 2019-12-13 DIAGNOSIS — N10 Acute tubulo-interstitial nephritis: Secondary | ICD-10-CM | POA: Diagnosis not present

## 2019-12-14 ENCOUNTER — Telehealth: Payer: Self-pay

## 2019-12-14 DIAGNOSIS — N10 Acute tubulo-interstitial nephritis: Secondary | ICD-10-CM | POA: Diagnosis not present

## 2019-12-14 NOTE — Telephone Encounter (Signed)
Received incoming call from [facility], informing us that this mutual patient is still having gross hematuria. They recommend pt be seen in the ED for evaluation, pt declined. They request we call patient, they also note that patients creatine and GFR are down.    Called pt he states that he has noticed blood in the urine again today. He denies pain, fever, nausea, vomiting, chills or clots. Patient is able to void with no difficulty. Pt is scheduled for a cysto and renal u/s within the next week. Offered patient to move appointment for cysto up, pt declines. Gave pt verbal reassurance, push fluids, keep f/u as scheduled.

## 2019-12-19 ENCOUNTER — Other Ambulatory Visit: Payer: Medicare Other | Admitting: Urology

## 2019-12-19 ENCOUNTER — Ambulatory Visit: Payer: Medicare Other | Admitting: Urology

## 2019-12-20 ENCOUNTER — Other Ambulatory Visit: Payer: Medicare Other | Admitting: Urology

## 2019-12-25 ENCOUNTER — Other Ambulatory Visit: Payer: Self-pay

## 2019-12-25 ENCOUNTER — Ambulatory Visit
Admission: RE | Admit: 2019-12-25 | Discharge: 2019-12-25 | Disposition: A | Discharge status: Home / Self Care | Payer: Medicare Other | Source: Ambulatory Visit | Attending: Urology | Admitting: Urology

## 2019-12-25 DIAGNOSIS — N1339 Other hydronephrosis: Secondary | ICD-10-CM | POA: Diagnosis not present

## 2019-12-26 ENCOUNTER — Ambulatory Visit: Payer: Medicare Other | Admitting: Urology

## 2019-12-26 ENCOUNTER — Other Ambulatory Visit: Payer: Self-pay | Admitting: Urology

## 2019-12-26 ENCOUNTER — Encounter: Payer: Self-pay | Admitting: Urology

## 2019-12-26 VITALS — BP 168/76 | HR 76 | Ht 72.0 in | Wt 185.0 lb

## 2019-12-26 DIAGNOSIS — R972 Elevated prostate specific antigen [PSA]: Secondary | ICD-10-CM

## 2019-12-26 DIAGNOSIS — R310 Gross hematuria: Secondary | ICD-10-CM | POA: Diagnosis not present

## 2019-12-26 LAB — MICROSCOPIC EXAMINATION: WBC, UA: >30 /hpf — AB (ref 0–5)

## 2019-12-26 LAB — URINALYSIS, COMPLETE
Bilirubin, UA: NEGATIVE
Glucose, UA: NEGATIVE
Ketones, UA: NEGATIVE
Nitrite, UA: NEGATIVE
Specific Gravity, UA: 1.015 (ref 1.005–1.030)
Urobilinogen, Ur: 0.2 mg/dL (ref 0.2–1.0)
pH, UA: 5.5 (ref 5.0–7.5)

## 2019-12-26 NOTE — Progress Notes (Signed)
Cystoscopy Procedure Note:  Indication: Gross hematuria, UTI  After informed consent and discussion of the procedure and its risks, AMEL GIANINO was positioned and prepped in the standard fashion. Cystoscopy was performed with a flexible cystoscope. The urethra, bladder neck and entire bladder was visualized in a standard fashion. The prostate was moderate in size. The ureteral orifices were visualized in their normal location and orientation.  Bladder mucosa grossly normal throughout.  Slightly cloudy urine.  No abnormalities on retroflexion.  Cytology sent.  Imaging: Renal ultrasound with no hydronephrosis or renal mass  Findings: Normal cystoscopy  Assessment and Plan: -Follow-up cytology results, if suspicious or positive would recommend OR for bilateral retrograde pyelograms, possible diagnostic ureteroscopy, possible biopsy  -RTC 4 to 6 weeks with repeat PSA for history of elevated PSA of 6.5(suspected at time of infection and may be falsely elevated).  Consider prostate biopsy if PSA remains elevated.  Nickolas Madrid, MD 12/26/2019

## 2019-12-26 NOTE — Addendum Note (Signed)
Addended by: Donalee Citrin on: 12/26/2019 12:04 PM   Modules accepted: Orders

## 2019-12-27 LAB — CYTOLOGY - NON PAP

## 2019-12-28 ENCOUNTER — Telehealth: Payer: Self-pay

## 2019-12-28 LAB — PATHOLOGY

## 2019-12-28 NOTE — Telephone Encounter (Signed)
-----   Message from Billey Co, MD sent at 12/27/2019  5:57 PM EDT ----- Good news, no cancer cells on urine sample.  Keep follow-up in 1 month for repeat PSA.  Nickolas Madrid, MD 12/27/2019

## 2019-12-28 NOTE — Telephone Encounter (Signed)
Called pt no answer. Unable to leave message as no  DPR is on file. 1st attempt.  

## 2019-12-28 NOTE — Telephone Encounter (Signed)
Pt returned call, informed him of the information below. Pt gave verbal understanding.

## 2020-01-18 DIAGNOSIS — D631 Anemia in chronic kidney disease: Secondary | ICD-10-CM | POA: Diagnosis not present

## 2020-01-18 DIAGNOSIS — E1151 Type 2 diabetes mellitus with diabetic peripheral angiopathy without gangrene: Secondary | ICD-10-CM | POA: Diagnosis not present

## 2020-01-18 DIAGNOSIS — R7401 Elevation of levels of liver transaminase levels: Secondary | ICD-10-CM | POA: Diagnosis not present

## 2020-01-18 DIAGNOSIS — R972 Elevated prostate specific antigen [PSA]: Secondary | ICD-10-CM | POA: Diagnosis not present

## 2020-01-18 DIAGNOSIS — N185 Chronic kidney disease, stage 5: Secondary | ICD-10-CM | POA: Diagnosis not present

## 2020-01-22 DIAGNOSIS — N10 Acute tubulo-interstitial nephritis: Secondary | ICD-10-CM | POA: Diagnosis not present

## 2020-01-22 DIAGNOSIS — E1122 Type 2 diabetes mellitus with diabetic chronic kidney disease: Secondary | ICD-10-CM | POA: Diagnosis not present

## 2020-01-22 DIAGNOSIS — N184 Chronic kidney disease, stage 4 (severe): Secondary | ICD-10-CM | POA: Diagnosis not present

## 2020-01-22 DIAGNOSIS — R310 Gross hematuria: Secondary | ICD-10-CM | POA: Diagnosis not present

## 2020-02-06 ENCOUNTER — Ambulatory Visit: Payer: Medicare Other | Admitting: Urology

## 2020-02-07 ENCOUNTER — Encounter: Payer: Self-pay | Admitting: Urology

## 2020-03-25 DIAGNOSIS — Z0120 Encounter for dental examination and cleaning without abnormal findings: Secondary | ICD-10-CM | POA: Diagnosis not present

## 2020-05-01 DIAGNOSIS — I10 Essential (primary) hypertension: Secondary | ICD-10-CM | POA: Diagnosis not present

## 2020-05-01 DIAGNOSIS — R310 Gross hematuria: Secondary | ICD-10-CM | POA: Diagnosis not present

## 2020-05-01 DIAGNOSIS — N10 Acute tubulo-interstitial nephritis: Secondary | ICD-10-CM | POA: Diagnosis not present

## 2020-05-01 DIAGNOSIS — N184 Chronic kidney disease, stage 4 (severe): Secondary | ICD-10-CM | POA: Diagnosis not present

## 2020-05-17 DIAGNOSIS — T2020XA Burn of second degree of head, face, and neck, unspecified site, initial encounter: Secondary | ICD-10-CM | POA: Diagnosis not present

## 2020-05-17 DIAGNOSIS — S0500XA Injury of conjunctiva and corneal abrasion without foreign body, unspecified eye, initial encounter: Secondary | ICD-10-CM | POA: Diagnosis not present

## 2020-05-29 DIAGNOSIS — E211 Secondary hyperparathyroidism, not elsewhere classified: Secondary | ICD-10-CM | POA: Diagnosis not present

## 2020-05-29 DIAGNOSIS — N12 Tubulo-interstitial nephritis, not specified as acute or chronic: Secondary | ICD-10-CM | POA: Diagnosis not present

## 2020-05-29 DIAGNOSIS — E1151 Type 2 diabetes mellitus with diabetic peripheral angiopathy without gangrene: Secondary | ICD-10-CM | POA: Diagnosis not present

## 2020-05-29 DIAGNOSIS — N185 Chronic kidney disease, stage 5: Secondary | ICD-10-CM | POA: Diagnosis not present

## 2020-06-16 DIAGNOSIS — G5601 Carpal tunnel syndrome, right upper limb: Secondary | ICD-10-CM | POA: Diagnosis not present

## 2020-06-16 DIAGNOSIS — M19031 Primary osteoarthritis, right wrist: Secondary | ICD-10-CM | POA: Diagnosis not present

## 2020-07-31 DIAGNOSIS — B349 Viral infection, unspecified: Secondary | ICD-10-CM | POA: Diagnosis not present

## 2020-07-31 DIAGNOSIS — Z20822 Contact with and (suspected) exposure to covid-19: Secondary | ICD-10-CM | POA: Diagnosis not present

## 2020-08-06 DIAGNOSIS — D631 Anemia in chronic kidney disease: Secondary | ICD-10-CM | POA: Diagnosis not present

## 2020-08-06 DIAGNOSIS — N185 Chronic kidney disease, stage 5: Secondary | ICD-10-CM | POA: Diagnosis not present

## 2020-08-06 DIAGNOSIS — J189 Pneumonia, unspecified organism: Secondary | ICD-10-CM | POA: Diagnosis not present

## 2020-08-06 DIAGNOSIS — R059 Cough, unspecified: Secondary | ICD-10-CM | POA: Diagnosis not present

## 2020-09-04 DIAGNOSIS — J189 Pneumonia, unspecified organism: Secondary | ICD-10-CM | POA: Diagnosis not present

## 2020-09-09 DIAGNOSIS — I700 Atherosclerosis of aorta: Secondary | ICD-10-CM | POA: Diagnosis not present

## 2020-09-09 DIAGNOSIS — J432 Centrilobular emphysema: Secondary | ICD-10-CM | POA: Diagnosis not present

## 2020-09-09 DIAGNOSIS — J439 Emphysema, unspecified: Secondary | ICD-10-CM | POA: Diagnosis not present

## 2020-09-09 DIAGNOSIS — J189 Pneumonia, unspecified organism: Secondary | ICD-10-CM | POA: Diagnosis not present

## 2020-09-19 DIAGNOSIS — G5601 Carpal tunnel syndrome, right upper limb: Secondary | ICD-10-CM | POA: Diagnosis not present

## 2020-09-19 DIAGNOSIS — M62531 Muscle wasting and atrophy, not elsewhere classified, right forearm: Secondary | ICD-10-CM | POA: Diagnosis not present

## 2020-09-30 DIAGNOSIS — I6529 Occlusion and stenosis of unspecified carotid artery: Secondary | ICD-10-CM | POA: Diagnosis not present

## 2020-09-30 DIAGNOSIS — N185 Chronic kidney disease, stage 5: Secondary | ICD-10-CM | POA: Diagnosis not present

## 2020-09-30 DIAGNOSIS — D631 Anemia in chronic kidney disease: Secondary | ICD-10-CM | POA: Diagnosis not present

## 2020-09-30 DIAGNOSIS — E1151 Type 2 diabetes mellitus with diabetic peripheral angiopathy without gangrene: Secondary | ICD-10-CM | POA: Diagnosis not present

## 2020-10-02 DIAGNOSIS — M25531 Pain in right wrist: Secondary | ICD-10-CM | POA: Diagnosis not present

## 2020-10-02 DIAGNOSIS — G5601 Carpal tunnel syndrome, right upper limb: Secondary | ICD-10-CM | POA: Diagnosis not present

## 2020-10-02 DIAGNOSIS — Z4789 Encounter for other orthopedic aftercare: Secondary | ICD-10-CM | POA: Diagnosis not present

## 2020-10-16 DIAGNOSIS — G5601 Carpal tunnel syndrome, right upper limb: Secondary | ICD-10-CM | POA: Diagnosis not present

## 2020-10-16 DIAGNOSIS — Z4789 Encounter for other orthopedic aftercare: Secondary | ICD-10-CM | POA: Diagnosis not present

## 2020-10-21 DIAGNOSIS — Z4789 Encounter for other orthopedic aftercare: Secondary | ICD-10-CM | POA: Diagnosis not present

## 2020-10-21 DIAGNOSIS — R29898 Other symptoms and signs involving the musculoskeletal system: Secondary | ICD-10-CM | POA: Diagnosis not present

## 2020-10-21 DIAGNOSIS — Z9889 Other specified postprocedural states: Secondary | ICD-10-CM | POA: Diagnosis not present

## 2020-10-28 DIAGNOSIS — Z9889 Other specified postprocedural states: Secondary | ICD-10-CM | POA: Diagnosis not present

## 2020-10-28 DIAGNOSIS — Z4789 Encounter for other orthopedic aftercare: Secondary | ICD-10-CM | POA: Diagnosis not present

## 2020-10-28 DIAGNOSIS — R29898 Other symptoms and signs involving the musculoskeletal system: Secondary | ICD-10-CM | POA: Diagnosis not present

## 2020-12-23 ENCOUNTER — Telehealth: Payer: Self-pay | Admitting: Urology

## 2020-12-23 NOTE — Telephone Encounter (Signed)
Patient's wife called the office today with complaint of her husband having symptoms of possible infection. She states they called the PCP and was told to call our office for an appointment asap.   When asked about symptoms, patient's wife says "infection, yellow puss" from his penis. She denied any other symptoms and said we could talk to the patient when he comes in for an appointment.   Appt made.

## 2020-12-24 ENCOUNTER — Other Ambulatory Visit: Payer: Self-pay

## 2020-12-24 ENCOUNTER — Encounter: Payer: Self-pay | Admitting: Urology

## 2020-12-24 ENCOUNTER — Ambulatory Visit: Payer: Medicare Other | Admitting: Urology

## 2020-12-24 VITALS — BP 156/58 | HR 89 | Ht 72.0 in | Wt 199.7 lb

## 2020-12-24 DIAGNOSIS — N390 Urinary tract infection, site not specified: Secondary | ICD-10-CM

## 2020-12-24 DIAGNOSIS — R972 Elevated prostate specific antigen [PSA]: Secondary | ICD-10-CM | POA: Diagnosis not present

## 2020-12-24 DIAGNOSIS — R310 Gross hematuria: Secondary | ICD-10-CM | POA: Diagnosis not present

## 2020-12-24 MED ORDER — LEVOFLOXACIN 500 MG PO TABS: 500.0000 mg | ORAL_TABLET | ORAL | 0 refills | Status: DC

## 2020-12-24 NOTE — Progress Notes (Signed)
   12/24/2020 8:46 AM   [name] 1951-04-17 [id number]  Reason for visit: Penile discharge, pelvic pain, dysuria, history of emphysematous pyelonephritis, CKD, elevated PSA  HPI: I saw Mr. Betten as an add-on in urology clinic today for a possible UTI.  He is a complex 70 year old male with interesting urologic history.  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 I met him originally on 11/16/2019 when he was transferred from [facility] ED to [facility] with fevers and CT scan showing gas in the renal collecting systems bilaterally as well as in the bladder, and significant left perinephric stranding and mild left hydronephrosis. He had refused Foley catheter placement at that time and denied any urinary symptoms. He had had 3 to 4 weeks of pneumaturia prior to presenting to the hospital. He improved with antibiotics.  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  He also had a mildly elevated PSA at that time of 6.5(suspect secondary to acute infection and may be falsely elevated) and I recommended repeat PSA, but he never followed up.  He reports about a week of burning with urination, and a few days of milky penile discharge and pelvic pain behind the scrotum.  He has been riding his lawnmower quite a bit lately which she thinks may be contributing.  He denies any fevers or chills.  Vitals are normal today with temperature 98.9, blood pressure 156/58, heart rate 89.  PVRs relatively normal at 155 mL.  Urinalysis today grossly concerning for infection with greater than 30 WBCs, 0-2 RBCs, many bacteria, nitrite positive, 2+ leukocytes.  Will send for culture and atypicals.  I recommended a 1 month course of  Levaquin.  With his CKD, the dosing is 750 mg every other day.  Return precautions discussed extensively.  Again recommended repeat PSA in a few months after this acute UTI to confirm normalization from prior value of 6.5.  RTC 6 weeks with me, re- discuss PSA at that time   Billey Co, [place] [address] [place], [place] [address] [phone]

## 2020-12-24 NOTE — Patient Instructions (Signed)

## 2020-12-25 ENCOUNTER — Telehealth: Payer: Self-pay

## 2020-12-25 LAB — URINALYSIS, COMPLETE
Bilirubin, UA: NEGATIVE
Glucose, UA: NEGATIVE
Ketones, UA: NEGATIVE
Nitrite, UA: POSITIVE — AB
Specific Gravity, UA: <1.005 — ABNORMAL LOW (ref 1.005–1.030)
Urobilinogen, Ur: 0.2 mg/dL (ref 0.2–1.0)
pH, UA: 5 (ref 5.0–7.5)

## 2020-12-25 LAB — MICROSCOPIC EXAMINATION: WBC, UA: >30 /HPF — AB (ref 0–5)

## 2020-12-25 NOTE — Telephone Encounter (Signed)
Patients wife called stating he is having cold chills, shaking, weakness, low grade fever and she reports he is disoriented. I spoke to [facility], she recommends patient go to ER to be further evaluated. Patient wife aware and verbalizes understanding.

## 2020-12-29 LAB — CULTURE, URINE COMPREHENSIVE

## 2020-12-30 LAB — MYCOPLASMA / UREAPLASMA CULTURE
Mycoplasma hominis Culture: NEGATIVE
Ureaplasma urealyticum: NEGATIVE

## 2021-02-04 ENCOUNTER — Other Ambulatory Visit: Payer: Self-pay

## 2021-02-04 ENCOUNTER — Encounter: Payer: Self-pay | Admitting: Urology

## 2021-02-04 ENCOUNTER — Ambulatory Visit: Payer: Medicare Other | Admitting: Urology

## 2021-02-04 VITALS — BP 148/66 | HR 65 | Ht 72.0 in | Wt 198.1 lb

## 2021-02-04 DIAGNOSIS — R972 Elevated prostate specific antigen [PSA]: Secondary | ICD-10-CM

## 2021-02-04 DIAGNOSIS — N390 Urinary tract infection, site not specified: Secondary | ICD-10-CM

## 2021-02-04 DIAGNOSIS — N410 Acute prostatitis: Secondary | ICD-10-CM | POA: Diagnosis not present

## 2021-02-04 NOTE — Progress Notes (Signed)
   02/04/2021 10:30 AM   [name] 09-10-1950 [id number]  Reason for visit: Follow up acute bacterial prostatitis/recurrent UTI, elevated PSA  HPI: He is a complex 70 year old male with interesting urologic history.  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 I met him originally on 11/16/2019 when he was transferred from [facility] ED to [facility] with fevers and CT scan showing gas in the renal collecting systems bilaterally as well as in the bladder, and significant left perinephric stranding and mild left hydronephrosis. He had refused Foley catheter placement at that time and denied any urinary symptoms. He had had 3 to 4 weeks of pneumaturia prior to presenting to the hospital. He improved with antibiotics.   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  He also had a mildly elevated PSA at that time of 6.5(suspect secondary to acute infection and may be falsely elevated) and I recommended repeat PSA, but he never followed up.  I last saw him on 12/24/2020 when he presented with burning with urination, purulent penile discharge, low pelvic pain behind the scrotum, and subjective fevers at home.  Urinalysis was grossly infected, and culture ultimately grew Klebsiella.  He was treated with a 1 month course of Levaquin every other day with his CKD.  He felt like the Levaquin put him in a "brain fog."  His urinary symptoms have completely resolved and he denies any urinary complaints today.  I recommended starting cranberry tablet prophylaxis daily.  Return precautions discussed extensively.  Finally, we also reviewed his history of elevated PSA around the time of infection, and possible etiologies of false  elevation.   RTC 6 months PVR and PSA prior with reflex to free   Billey Co, MD  [facility] [address], [place], [place] [address] [phone]

## 2021-02-04 NOTE — Patient Instructions (Signed)
Take cranberry tablets 1-2 times every day to help prevent urinary infections  If you have symptoms of another urinary infection, prostate infection, or blood in the urine, please call us right away to drop off a urine sample

## 2021-02-24 DIAGNOSIS — N185 Chronic kidney disease, stage 5: Secondary | ICD-10-CM | POA: Diagnosis not present

## 2021-02-24 DIAGNOSIS — E1151 Type 2 diabetes mellitus with diabetic peripheral angiopathy without gangrene: Secondary | ICD-10-CM | POA: Diagnosis not present

## 2021-02-24 DIAGNOSIS — I700 Atherosclerosis of aorta: Secondary | ICD-10-CM | POA: Diagnosis not present

## 2021-02-24 DIAGNOSIS — D631 Anemia in chronic kidney disease: Secondary | ICD-10-CM | POA: Diagnosis not present

## 2021-03-03 ENCOUNTER — Other Ambulatory Visit: Payer: Self-pay

## 2021-04-14 DIAGNOSIS — E113293 Type 2 diabetes mellitus with mild nonproliferative diabetic retinopathy without macular edema, bilateral: Secondary | ICD-10-CM | POA: Diagnosis not present

## 2021-04-14 DIAGNOSIS — H43813 Vitreous degeneration, bilateral: Secondary | ICD-10-CM | POA: Diagnosis not present

## 2021-04-21 DIAGNOSIS — J189 Pneumonia, unspecified organism: Secondary | ICD-10-CM | POA: Diagnosis not present

## 2021-04-21 DIAGNOSIS — N185 Chronic kidney disease, stage 5: Secondary | ICD-10-CM | POA: Diagnosis not present

## 2021-04-21 DIAGNOSIS — R051 Acute cough: Secondary | ICD-10-CM | POA: Diagnosis not present

## 2021-04-21 DIAGNOSIS — E1151 Type 2 diabetes mellitus with diabetic peripheral angiopathy without gangrene: Secondary | ICD-10-CM | POA: Diagnosis not present

## 2021-05-04 DIAGNOSIS — J189 Pneumonia, unspecified organism: Secondary | ICD-10-CM | POA: Diagnosis not present

## 2021-07-30 ENCOUNTER — Other Ambulatory Visit (HOSPITAL_COMMUNITY): Payer: Self-pay | Admitting: Endocrinology

## 2021-07-30 DIAGNOSIS — D472 Monoclonal gammopathy: Secondary | ICD-10-CM | POA: Diagnosis not present

## 2021-07-30 DIAGNOSIS — E538 Deficiency of other specified B group vitamins: Secondary | ICD-10-CM | POA: Diagnosis not present

## 2021-07-30 DIAGNOSIS — H53009 Unspecified amblyopia, unspecified eye: Secondary | ICD-10-CM

## 2021-07-30 DIAGNOSIS — N185 Chronic kidney disease, stage 5: Secondary | ICD-10-CM | POA: Diagnosis not present

## 2021-07-30 DIAGNOSIS — E1151 Type 2 diabetes mellitus with diabetic peripheral angiopathy without gangrene: Secondary | ICD-10-CM | POA: Diagnosis not present

## 2021-07-30 DIAGNOSIS — D631 Anemia in chronic kidney disease: Secondary | ICD-10-CM | POA: Diagnosis not present

## 2021-07-30 DIAGNOSIS — I700 Atherosclerosis of aorta: Secondary | ICD-10-CM | POA: Diagnosis not present

## 2021-08-04 ENCOUNTER — Ambulatory Visit (HOSPITAL_COMMUNITY): Payer: Medicare Other

## 2021-08-05 ENCOUNTER — Other Ambulatory Visit: Payer: Medicare Other

## 2021-08-06 ENCOUNTER — Encounter: Payer: Self-pay | Admitting: Urology

## 2021-08-13 ENCOUNTER — Ambulatory Visit: Payer: Medicare Other | Admitting: Urology

## 2021-10-07 ENCOUNTER — Other Ambulatory Visit: Payer: Self-pay

## 2021-10-07 ENCOUNTER — Ambulatory Visit: Payer: Medicare Other | Admitting: Urology

## 2021-10-07 ENCOUNTER — Other Ambulatory Visit: Payer: Self-pay | Admitting: Urology

## 2021-10-07 ENCOUNTER — Encounter: Payer: Self-pay | Admitting: Urology

## 2021-10-07 VITALS — BP 163/67 | HR 77 | Ht 72.0 in | Wt 212.5 lb

## 2021-10-07 DIAGNOSIS — N390 Urinary tract infection, site not specified: Secondary | ICD-10-CM | POA: Diagnosis not present

## 2021-10-07 DIAGNOSIS — R972 Elevated prostate specific antigen [PSA]: Secondary | ICD-10-CM | POA: Diagnosis not present

## 2021-10-07 DIAGNOSIS — N5089 Other specified disorders of the male genital organs: Secondary | ICD-10-CM

## 2021-10-07 MED ORDER — LEVOFLOXACIN 500 MG PO TABS: 500.0000 mg | ORAL_TABLET | ORAL | 0 refills | Status: DC

## 2021-10-07 NOTE — Progress Notes (Signed)
? ?10/07/2021 ?5:05 PM  ? ?Austin Goodwin ?07/14/1951 ?[id number] ? ?Reason for visit: Urinary symptoms, elevated PSA ? ?HPI: ?He is a complex 71 year old male with interesting urologic history.  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 I met him originally on 11/16/2019 when he was transferred from [facility] ED to [facility] with fevers and CT scan showing gas in the renal collecting systems bilaterally as well as in the bladder, and significant left perinephric stranding and mild left hydronephrosis. He had refused Foley catheter placement at that time and denied any urinary symptoms. He had had 3 to 4 weeks of pneumaturia prior to presenting to the hospital. He improved with antibiotics. ?  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  He also had a mildly elevated PSA at that time of 6.5 and I recommended repeat PSA, but he never followed up. ? ?I last saw him on 12/24/2020 when he presented with burning with urination, purulent penile discharge, low pelvic pain behind the scrotum, and subjective fevers at home.  Urinalysis was grossly infected, and culture ultimately grew Klebsiella.  He was treated with a 1 month course of Levaquin every other day with his CKD.  He felt like the Levaquin put him in a "brain fog."  His urinary symptoms resolved with the antibiotics. ? ?Today, he reports dysuria over the last few days similar to his prior UTI symptoms.  He also had a significant fall off of a barn about 2 weeks ago and has significant abdominal bruising, but no scrotal swelling or scrotal bruising.  He denies any gross hematuria after the fall or urinary symptoms into the last few days.  Urinalysis today is pending, and will send  for culture.  I recommended a course of Levaquin for suspected UTI based on his prior presentation with similar symptoms. ? ?We also reviewed his history of elevated PSA of 6.5, that on repeat with PCP in August 2022 increased to 8.6.  We reviewed the implications of an elevated PSA and the uncertainty surrounding it. In general, a man's PSA increases with age and is produced by both normal and cancerous prostate tissue. The differential diagnosis for elevated PSA includes BPH, prostate cancer, infection, recent intercourse/ejaculation, recent urethroscopic manipulation (foley placement/cystoscopy) or trauma, and prostatitis.  ? ?Management of an elevated PSA can include observation or prostate biopsy and we discussed this in detail. Our goal is to detect clinically significant prostate cancers, and manage with either active surveillance, surgery, or radiation for localized disease. Risks of prostate biopsy include bleeding, infection (including life threatening sepsis), pain, and lower urinary symptoms. Hematuria, hematospermia, and blood in the stool are all common after biopsy and can persist up to 4 weeks.  ? ?I recommended again considering prostate biopsy with his history of persistently rising PSA and family history of prostate cancer once he has been treated for acute suspected infection today.  He defers biopsy, but is amenable to a prostate MRI.  We discussed the impact of infection on the PSA, as well as MRI findings, and I recommended waiting at least 6 weeks after treatment of this suspected infection prior to undergoing prostate MRI.  We discussed possible need for prostate biopsy pending MRI findings. ? ?Levaquin 500 mg every other  day x14 days total(CKD dosing) ?Follow-up urine culture ?Prostate MRI ~8 weeks for elevated PSA ? ? ?Billey Co, MD ? ?[facility] ?[address] ?[place], [place] [address] ?[phone] ? ?

## 2021-10-09 LAB — URINALYSIS, COMPLETE
Bilirubin, UA: NEGATIVE
Glucose, UA: NEGATIVE
Ketones, UA: NEGATIVE
Nitrite, UA: NEGATIVE
Specific Gravity, UA: 1.015 (ref 1.005–1.030)
Urobilinogen, Ur: 0.2 mg/dL (ref 0.2–1.0)
pH, UA: 5.5 (ref 5.0–7.5)

## 2021-10-09 LAB — MICROSCOPIC EXAMINATION
Bacteria, UA: NONE SEEN
RBC, Urine: >30 /hpf — AB (ref 0–2)

## 2021-10-14 LAB — CULTURE, URINE COMPREHENSIVE

## 2021-10-18 IMAGING — MR MR ABDOMEN W/O CM
8 series · 48 of 48 positions shown · non-contrast
Comparison: No priors.  Renal ultrasound 07/03/2019.

CLINICAL DATA: 68-year-old male with history of renal mass. Renal
insufficiency.

EXAM:
MRI ABDOMEN WITHOUT CONTRAST
TECHNIQUE: Multiplanar multisequence MR imaging was performed without the
administration of intravenous contrast.

[Series 2: cor haste · coronal · 6.0mm · 1.19mm/px · 4 of 32 slices shown]
[im 1/32]
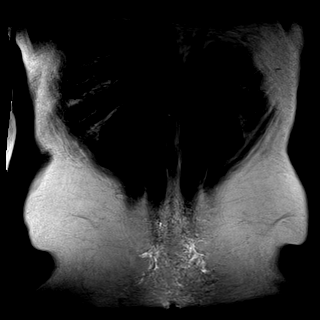
[im 11/32]
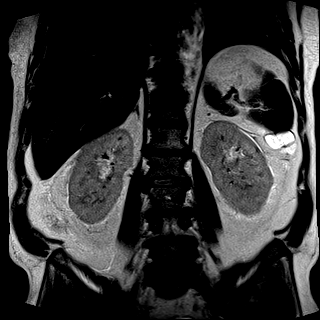
[im 21/32]
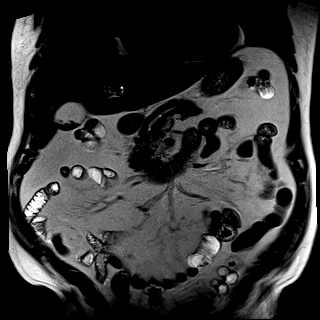
[im 32/32]
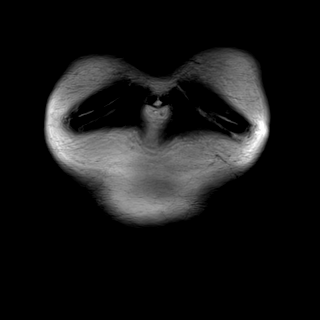

[Series 4: T2 fat-sat · axial · 6.0mm · 1.19mm/px · z∈[-98,+126]mm · 4 of 32 slices shown]
[im 1/32]
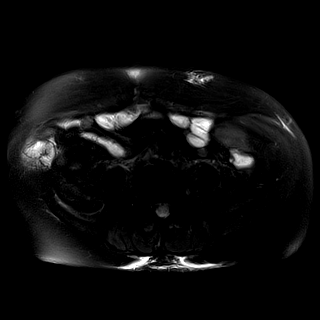
[im 11/32]
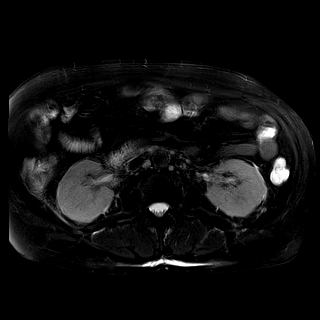
[im 21/32]
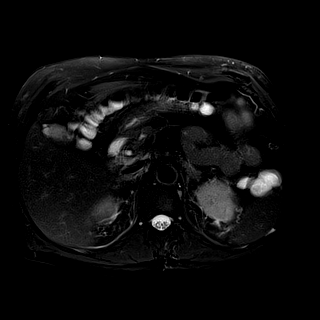
[im 32/32]
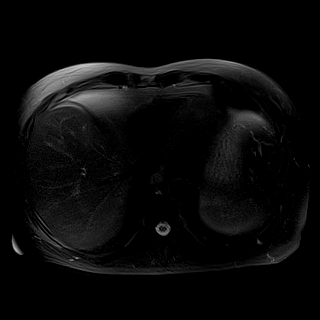

[Series 6: DWI · axial · 6.0mm · 1.42mm/px · z∈[-98,+126]mm · 10 of 96 slices shown (1 of 2)]
[im 1/96]
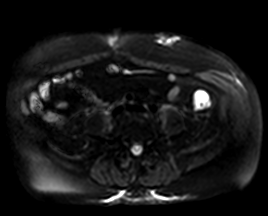
[im 11/96]
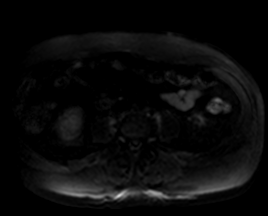
[im 22/96]
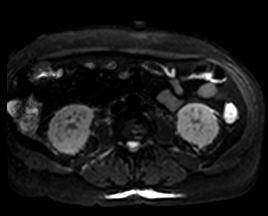
[im 32/96]
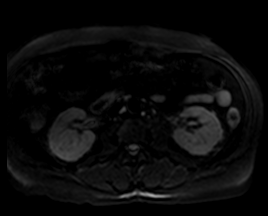
[im 43/96]
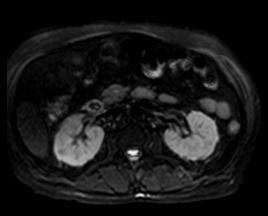
[im 53/96]
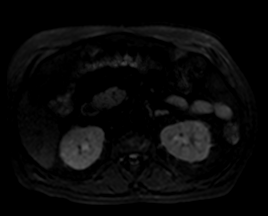
[im 64/96]
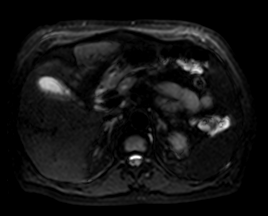
[im 74/96]
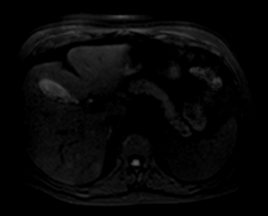
[im 85/96]
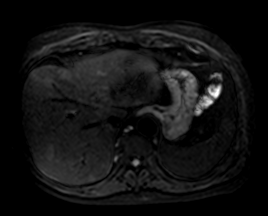
[im 96/96]
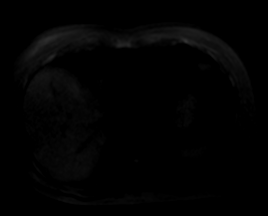

[Series 7: DWI · axial · 6.0mm · 1.42mm/px · z∈[-98,+126]mm · 3 of 32 slices shown (2 of 2)]
[im 1/32]
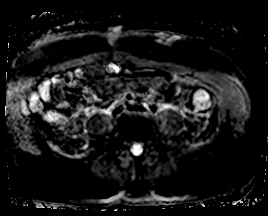
[im 16/32]
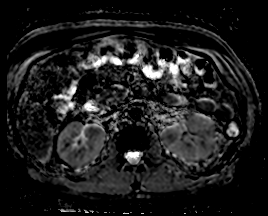
[im 32/32]
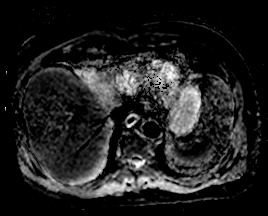

[Series 8: ax in & · axial · 3.0mm · 1.19mm/px · z∈[-93,+120]mm · 8 of 72 slices shown (1 of 2)]
[im 1/72]
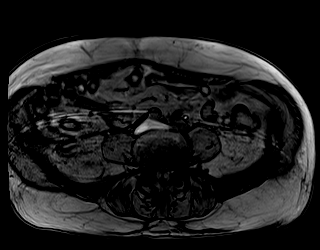
[im 11/72]
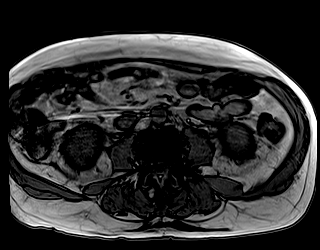
[im 21/72]
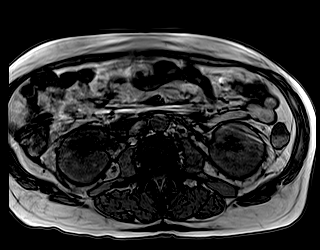
[im 31/72]
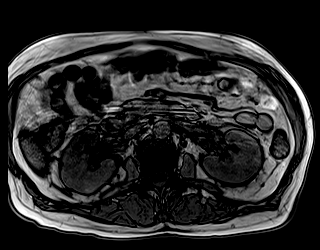
[im 41/72]
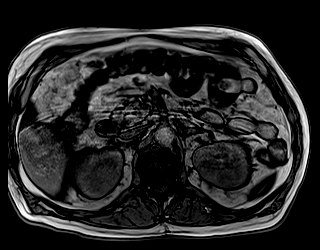
[im 51/72]
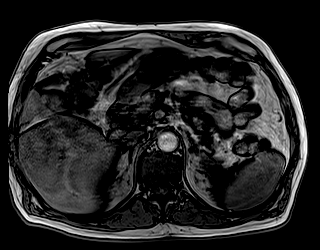
[im 61/72]
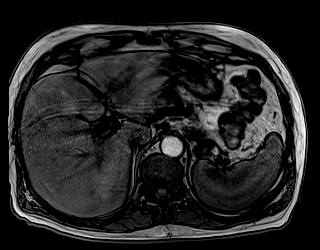
[im 72/72]
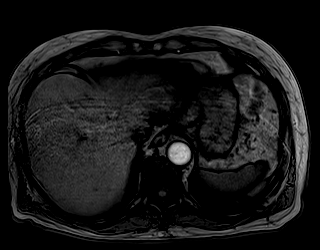

[Series 8: ax in & · axial · 3.0mm · 1.19mm/px · z∈[-93,+120]mm · 8 of 72 slices shown (2 of 2)]
[im 1/72]
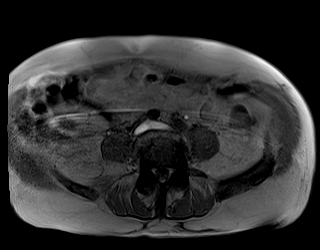
[im 11/72]
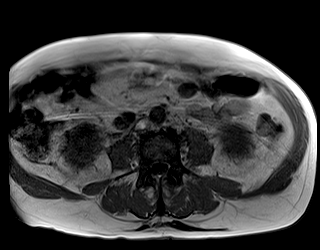
[im 21/72]
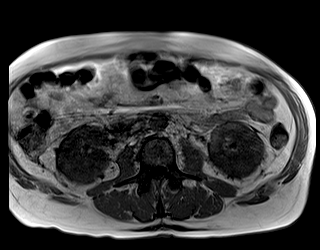
[im 31/72]
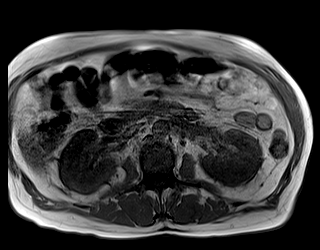
[im 41/72]
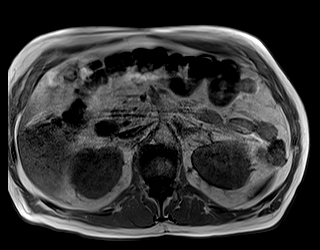
[im 51/72]
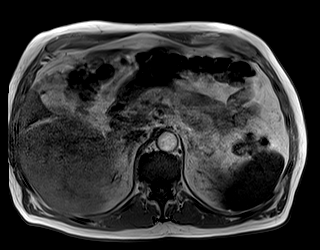
[im 61/72]
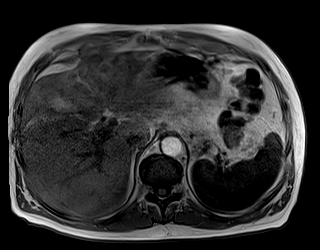
[im 72/72]
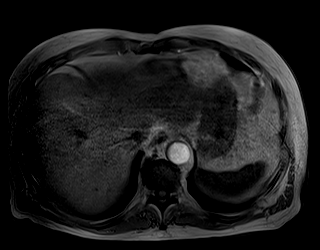

[Series 9: bSSFP · axial · 6.0mm · 0.74mm/px · z∈[-98,+126]mm · 3 of 32 slices shown]
[im 1/32]
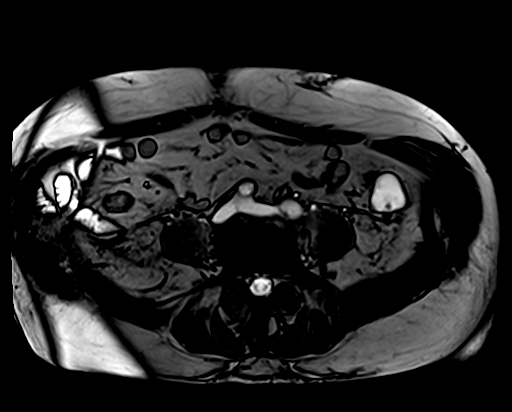
[im 16/32]
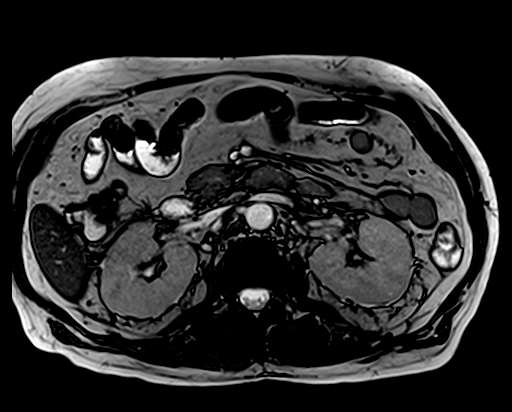
[im 32/32]
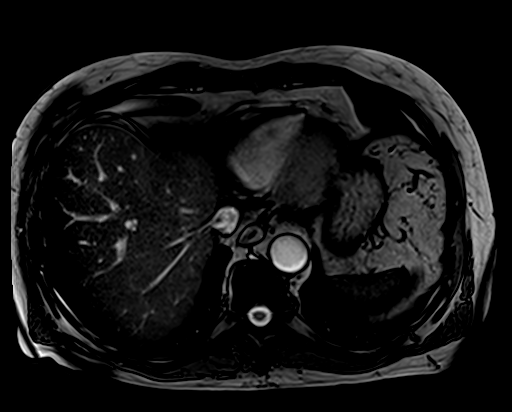

[Series 10: T1 dynamic · axial · non-contrast · 3.0mm · 1.19mm/px · z∈[-95,+118]mm · 8 of 72 slices shown]
[im 1/72]
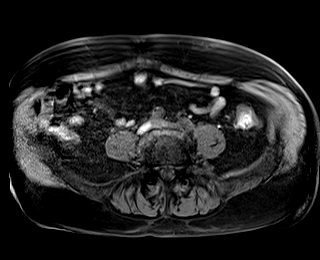
[im 11/72]
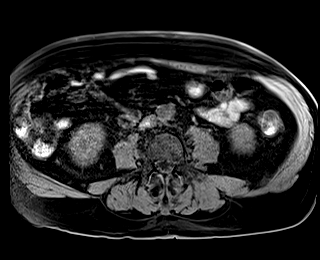
[im 21/72]
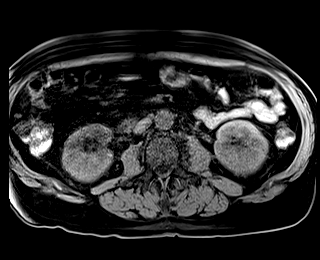
[im 31/72]
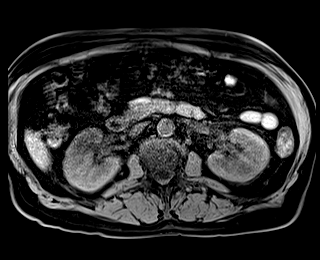
[im 41/72]
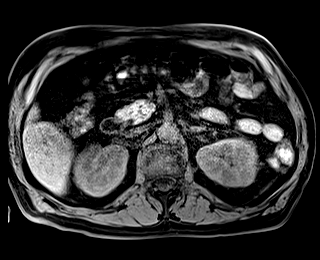
[im 51/72]
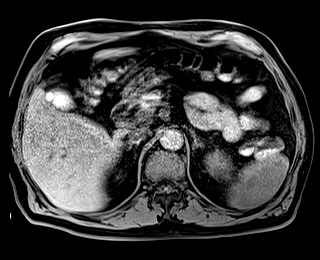
[im 61/72]
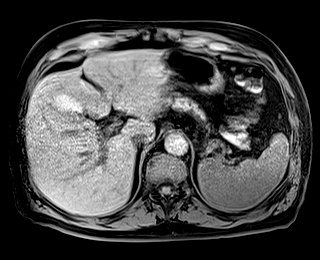
[im 72/72]
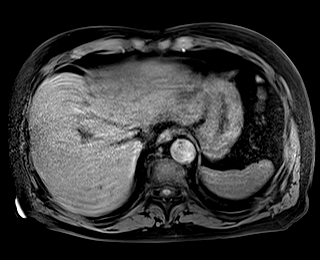

[48 of 48 positions shown; findings below may reference images not displayed]

FINDINGS: Comment: Today's study is limited for detection and characterization
of visceral and/or vascular lesions by lack of IV gadolinium.

Lower chest: Unremarkable.

Hepatobiliary: Liver is incompletely imaged, but within the
visualized portions of the liver there are no definite suspicious
cystic or solid hepatic lesions confidently identified on today's
noncontrast examination. Small filling defects lying dependently in
the gallbladder, compatible with cholelithiasis. No evidence to
suggest an acute cholecystitis at this time. No intra or
extrahepatic biliary ductal dilatation.

Pancreas: In the body of the pancreas (axial image 10 of series 4)
there is a well-defined T1 hypointense, T2 hyperintense 5 mm lesion
which is incompletely characterized on today's noncontrast
examination, but favored to represent a small benign lesions such as
a tiny pancreatic pseudocyst. No other definite pancreatic mass or
peripancreatic fluid collections or inflammatory changes noted on
today's noncontrast examination.

Spleen:  Unremarkable.

Adrenals/Urinary Tract: Areas of mild increased T2 signal intensity
in the retroperitoneal fat adjacent to both kidneys, nonspecific. In
the lower pole of the right kidney there is a 9 mm T1 isointense, T2
mildly hyperintense lesion (coronal image 13 of series 2),
incompletely characterized on today's noncontrast examination, but
statistically likely a cyst. Left kidney and bilateral adrenal
glands are otherwise normal in appearance. No hydroureteronephrosis
in the visualized portions of the abdomen.

Stomach/Bowel: Visualized portions are unremarkable.

Vascular/Lymphatic: No aneurysm identified in the visualized
abdominal vasculature.

Other: No significant volume of ascites noted in the visualized
portions of the peritoneal cavity.

Musculoskeletal: No aggressive appearing osseous lesions are noted
in the visualized portions of the skeleton on today's noncontrast
examination.
IMPRESSION: 1. 9 mm lesion in the lower pole the right kidney is incompletely
characterized on today's noncontrast examination, but has imaging
characteristics favoring a small cyst. If this is of clinical
concern, future evaluations could be performed with IV gadolinium to
provide definitive characterization.
2. Nonspecific increased T2 signal intensity around the kidneys
bilaterally which can be seen in the setting of renal insufficiency.
3. Cholelithiasis without evidence of acute cholecystitis at this
time.
4. Benign-appearing 5 mm cystic lesion in the body of the pancreas.
Followup abdominal MRI with and without IV gadolinium with MRCP or
pancreatic protocol CT scan is recommended in 2 years to ensure the
stability or resolution of this finding. This recommendation follows
ACR consensus guidelines: Management of Incidental Pancreatic Cysts:
A White Paper of the ACR Incidental Findings Committee. [HOSPITAL] 0300;[DATE].

## 2021-10-27 ENCOUNTER — Ambulatory Visit
Admission: RE | Admit: 2021-10-27 | Discharge: 2021-10-27 | Disposition: A | Discharge status: Home / Self Care | Payer: Medicare Other | Source: Ambulatory Visit | Attending: Urology | Admitting: Urology

## 2021-10-27 DIAGNOSIS — R972 Elevated prostate specific antigen [PSA]: Secondary | ICD-10-CM | POA: Diagnosis not present

## 2021-10-27 DIAGNOSIS — R590 Localized enlarged lymph nodes: Secondary | ICD-10-CM | POA: Diagnosis not present

## 2021-10-27 MED ORDER — GADOBUTROL 1 MMOL/ML IV SOLN
7.5000 mL | Freq: Once | INTRAVENOUS | Status: AC | PRN
  Administered 2021-10-27: 7.5 mL via INTRAVENOUS

## 2021-10-29 ENCOUNTER — Ambulatory Visit: Payer: Medicare Other | Admitting: Urology

## 2021-11-03 ENCOUNTER — Ambulatory Visit (INDEPENDENT_AMBULATORY_CARE_PROVIDER_SITE_OTHER): Payer: Medicare Other | Admitting: Urology

## 2021-11-03 DIAGNOSIS — R972 Elevated prostate specific antigen [PSA]: Secondary | ICD-10-CM

## 2021-11-03 NOTE — Progress Notes (Signed)
Virtual Visit via Telephone Note ? ?I connected with Austin Goodwin wife Austin Goodwin on 11/03/21 at 11:30 AM EDT by telephone and verified that I am speaking with the correct person using two identifiers.  He is very hard of hearing and so she typically interprets his medical visits and relays them to him. ?  ?Patient location: Home ?Provider location: [facility] ? ? ?I discussed the limitations, risks, security and privacy concerns of performing an evaluation and management service by telephone and the availability of in person appointments. We discussed the impact of the COVID-19 pandemic on the healthcare system, and the importance of social distancing and reducing patient and provider exposure. I also discussed with the patient that there may be a patient responsible charge related to this service. The patient expressed understanding and agreed to proceed. ? ?Reason for visit: ?Elevated PSA, prostate MRI results ? ?History of Present Illness: ?71 year old male with complex history including drug-induced interstitial nephritis in fall 2020 requiring dialysis, as well as history of UTI and prostatitis.  He recently was treated for suspected prostatitis on 10/07/2021, and PSA had continued to rise, most recently 8.6 in August 2022, and I recommended a follow-up MRI 2 months after treatment for prostatitis to evaluate for any suspicious lesions, as he deferred biopsy.  Unfortunately this MRI was performed on 10/27/2021.  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 ? ?I had a long discussion about these findings today, and my recommendation for prostate biopsy to rule out prostate cancer.  Risks and benefits of biopsy discussed.  They are very averse to prostate biopsy, and he has been challenging to care for based on his lack of follow-up and follow through on recommended tests and  work-up.  We discussed the possible risk of developing metastatic prostate cancer and missing the window for cure.  I think they understand these risks and still would like to defer biopsy. ? ?They are amenable to follow-up in 3 months with a repeat PSA at that time, and reconsidering biopsy if PSA continues to rise. ? ? ?Follow Up: ?Patient refused prostate biopsy ?RTC 3 months with PSA prior to re-discuss biopsy ?  ?I discussed the assessment and treatment plan with the patient. The patient was provided an opportunity to ask questions and all were answered. The patient agreed with the plan and demonstrated an understanding of the instructions. ?  ?The patient was advised to call back or seek an in-person evaluation if the symptoms worsen or if the condition fails to improve as anticipated. ? ?I provided 13 minutes of non-face-to-face time during this encounter. ? ? ?Billey Co, MD  ? ?

## 2021-11-10 ENCOUNTER — Telehealth: Payer: Medicare Other | Admitting: Urology

## 2021-12-01 DIAGNOSIS — R972 Elevated prostate specific antigen [PSA]: Secondary | ICD-10-CM | POA: Diagnosis not present

## 2021-12-01 DIAGNOSIS — R300 Dysuria: Secondary | ICD-10-CM | POA: Diagnosis not present

## 2021-12-01 DIAGNOSIS — N411 Chronic prostatitis: Secondary | ICD-10-CM | POA: Diagnosis not present

## 2021-12-17 ENCOUNTER — Other Ambulatory Visit (HOSPITAL_COMMUNITY): Payer: Self-pay | Admitting: Endocrinology

## 2021-12-17 ENCOUNTER — Ambulatory Visit (HOSPITAL_COMMUNITY)
Admission: RE | Admit: 2021-12-17 | Discharge: 2021-12-17 | Disposition: A | Discharge status: Home / Self Care | Payer: Medicare Other | Source: Ambulatory Visit | Attending: Endocrinology | Admitting: Endocrinology

## 2021-12-17 DIAGNOSIS — M542 Cervicalgia: Secondary | ICD-10-CM | POA: Diagnosis not present

## 2022-02-02 ENCOUNTER — Other Ambulatory Visit: Payer: Medicare Other

## 2022-02-02 DIAGNOSIS — N185 Chronic kidney disease, stage 5: Secondary | ICD-10-CM | POA: Diagnosis not present

## 2022-02-02 DIAGNOSIS — I700 Atherosclerosis of aorta: Secondary | ICD-10-CM | POA: Diagnosis not present

## 2022-02-02 DIAGNOSIS — E1151 Type 2 diabetes mellitus with diabetic peripheral angiopathy without gangrene: Secondary | ICD-10-CM | POA: Diagnosis not present

## 2022-02-02 DIAGNOSIS — D631 Anemia in chronic kidney disease: Secondary | ICD-10-CM | POA: Diagnosis not present

## 2022-02-04 ENCOUNTER — Ambulatory Visit: Payer: Medicare Other | Admitting: Urology

## 2022-02-26 DIAGNOSIS — R972 Elevated prostate specific antigen [PSA]: Secondary | ICD-10-CM | POA: Diagnosis not present

## 2022-02-26 DIAGNOSIS — N411 Chronic prostatitis: Secondary | ICD-10-CM | POA: Diagnosis not present

## 2022-02-26 DIAGNOSIS — R9349 Abnormal radiologic findings on diagnostic imaging of other urinary organs: Secondary | ICD-10-CM | POA: Diagnosis not present

## 2022-03-09 DIAGNOSIS — R972 Elevated prostate specific antigen [PSA]: Secondary | ICD-10-CM | POA: Diagnosis not present

## 2022-03-11 DIAGNOSIS — I10 Essential (primary) hypertension: Secondary | ICD-10-CM | POA: Diagnosis not present

## 2022-03-11 DIAGNOSIS — N184 Chronic kidney disease, stage 4 (severe): Secondary | ICD-10-CM | POA: Diagnosis not present

## 2022-03-11 DIAGNOSIS — N10 Acute tubulo-interstitial nephritis: Secondary | ICD-10-CM | POA: Diagnosis not present

## 2022-03-11 DIAGNOSIS — E8722 Chronic metabolic acidosis: Secondary | ICD-10-CM | POA: Diagnosis not present

## 2022-05-18 ENCOUNTER — Encounter: Payer: Self-pay | Admitting: Dermatology

## 2022-05-18 ENCOUNTER — Ambulatory Visit: Payer: Medicare Other | Admitting: Dermatology

## 2022-05-18 DIAGNOSIS — L720 Epidermal cyst: Secondary | ICD-10-CM | POA: Diagnosis not present

## 2022-05-18 DIAGNOSIS — L821 Other seborrheic keratosis: Secondary | ICD-10-CM

## 2022-05-18 DIAGNOSIS — L814 Other melanin hyperpigmentation: Secondary | ICD-10-CM | POA: Diagnosis not present

## 2022-05-18 DIAGNOSIS — L570 Actinic keratosis: Secondary | ICD-10-CM | POA: Diagnosis not present

## 2022-05-18 DIAGNOSIS — L578 Other skin changes due to chronic exposure to nonionizing radiation: Secondary | ICD-10-CM

## 2022-05-18 DIAGNOSIS — Z1283 Encounter for screening for malignant neoplasm of skin: Secondary | ICD-10-CM

## 2022-05-18 DIAGNOSIS — D229 Melanocytic nevi, unspecified: Secondary | ICD-10-CM

## 2022-05-18 NOTE — Progress Notes (Signed)
New Patient Visit  Subjective  Austin Goodwin is a 71 y.o. male who presents for the following: lesion (Left temple. Scaly rough spot).  The patient presents for Upper Body Skin Exam (UBSE) for skin cancer screening and mole check.  The patient has spots, moles and lesions to be evaluated, some may be new or changing and the patient has concerns that these could be cancer.  Review of Systems: No other skin or systemic complaints except as noted in HPI or Assessment and Plan.   Objective  Well appearing patient in no apparent distress; mood and affect are within normal limits.  All skin waist up examined.  Mid Back x1, face x3 (4) Erythematous thin papules/macules with gritty scale.   Back Subcutaneous nodules   Assessment & Plan   Lentigines - Scattered tan macules - Due to sun exposure - Benign-appearing, observe - Recommend daily broad spectrum sunscreen SPF 30+ to sun-exposed areas, reapply every 2 hours as needed. - Call for any changes  Seborrheic Keratoses - Stuck-on, waxy, tan-brown papules and/or plaques  - Benign-appearing - Discussed benign etiology and prognosis. - Observe - Call for any changes  Melanocytic Nevi - Tan-brown and/or pink-flesh-colored symmetric macules and papules - Benign appearing on exam today - Observation - Call clinic for new or changing moles - Recommend daily use of broad spectrum spf 30+ sunscreen to sun-exposed areas.   Hemangiomas - Red papules - Discussed benign nature - Observe - Call for any changes  Actinic Damage - Chronic condition, secondary to cumulative UV/sun exposure - diffuse scaly erythematous macules with underlying dyspigmentation - Recommend daily broad spectrum sunscreen SPF 30+ to sun-exposed areas, reapply every 2 hours as needed.  - Staying in the shade or wearing long sleeves, sun glasses (UVA+UVB protection) and wide brim hats (4-inch brim around the entire circumference of the hat) are also  recommended for sun protection.  - Call for new or changing lesions.  Skin cancer screening performed today.  AK (actinic keratosis) (4) Mid Back x1, face x3  Actinic keratoses are precancerous spots that appear secondary to cumulative UV radiation exposure/sun exposure over time. They are chronic with expected duration over 1 year. A portion of actinic keratoses will progress to squamous cell carcinoma of the skin. It is not possible to reliably predict which spots will progress to skin cancer and so treatment is recommended to prevent development of skin cancer.  Recommend daily broad spectrum sunscreen SPF 30+ to sun-exposed areas, reapply every 2 hours as needed.  Recommend staying in the shade or wearing long sleeves, sun glasses (UVA+UVB protection) and wide brim hats (4-inch brim around the entire circumference of the hat). Call for new or changing lesions.  Destruction of lesion - Mid Back x1, face x3  Destruction method: cryotherapy   Informed consent: discussed and consent obtained   Lesion destroyed using liquid nitrogen: Yes   Region frozen until ice ball extended beyond lesion: Yes   Outcome: patient tolerated procedure well with no complications   Post-procedure details: wound care instructions given   Additional details:  Prior to procedure, discussed risks of blister formation, small wound, skin dyspigmentation, or rare scar following cryotherapy. Recommend Vaseline ointment to treated areas while healing.   Epidermal inclusion cyst Back  Benign-appearing. Exam most consistent with an epidermal inclusion cyst. Discussed that a cyst is a benign growth that can grow over time and sometimes get irritated or inflamed. Recommend observation if it is not bothersome. Discussed option of surgical  excision to remove it if it is growing, symptomatic, or other changes noted. Please call for new or changing lesions so they can be evaluated.     Return for TBSE, AK Follow Up.  I,  Emelia Salisbury, CMA, am acting as scribe for Forest Gleason, MD.

## 2022-05-18 NOTE — Patient Instructions (Addendum)
Cryotherapy Aftercare  Wash gently with soap and water everyday.   Apply Vaseline daily until healed.   Recommend daily broad spectrum sunscreen SPF 30+ to sun-exposed areas, reapply every 2 hours as needed. Call for new or changing lesions.  Staying in the shade or wearing long sleeves, sun glasses (UVA+UVB protection) and wide brim hats (4-inch brim around the entire circumference of the hat) are also recommended for sun protection.     Due to recent changes in healthcare laws, you may see results of your pathology and/or laboratory studies on MyChart before the doctors have had a chance to review them. We understand that in some cases there may be results that are confusing or concerning to you. Please understand that not all results are received at the same time and often the doctors may need to interpret multiple results in order to provide you with the best plan of care or course of treatment. Therefore, we ask that you please give us 2 business days to thoroughly review all your results before contacting the office for clarification. Should we see a critical lab result, you will be contacted sooner.   If You Need Anything After Your Visit  If you have any questions or concerns for your doctor, please call our main line at [phone] and press option 4 to reach your doctor's medical assistant. If no one answers, please leave a voicemail as directed and we will return your call as soon as possible. Messages left after 4 pm will be answered the following business day.   You may also send us a message via MyChart. We typically respond to MyChart messages within 1-2 business days.  For prescription refills, please ask your pharmacy to contact our office. Our fax number is [phone].  If you have an urgent issue when the clinic is closed that cannot wait until the next business day, you can page your doctor at the number below.    Please note that while we do our best to be available for  urgent issues outside of office hours, we are not available 24/7.   If you have an urgent issue and are unable to reach us, you may choose to seek medical care at your doctor's office, retail clinic, urgent care center, or emergency room.  If you have a medical emergency, please immediately call 911 or go to the emergency department.  Pager Numbers  - Dr. Kowalski: [phone]  - Dr. Moye: [phone]  - Dr. Stewart: [phone]  In the event of inclement weather, please call our main line at [phone] for an update on the status of any delays or closures.  Dermatology Medication Tips: Please keep the boxes that topical medications come in in order to help keep track of the instructions about where and how to use these. Pharmacies typically print the medication instructions only on the boxes and not directly on the medication tubes.   If your medication is too expensive, please contact our office at [phone] option 4 or send us a message through MyChart.   We are unable to tell what your co-pay for medications will be in advance as this is different depending on your insurance coverage. However, we may be able to find a substitute medication at lower cost or fill out paperwork to get insurance to cover a needed medication.   If a prior authorization is required to get your medication covered by your insurance company, please allow us 1-2 business days to complete this process.  Drug prices   often vary depending on where the prescription is filled and some pharmacies may offer cheaper prices.  The website www.goodrx.com contains coupons for medications through different pharmacies. The prices here do not account for what the cost may be with help from insurance (it may be cheaper with your insurance), but the website can give you the price if you did not use any insurance.  - You can print the associated coupon and take it with your prescription to the pharmacy.  - You may also  stop by our office during regular business hours and pick up a GoodRx coupon card.  - If you need your prescription sent electronically to a different pharmacy, notify our office through [facility] MyChart or by phone at [phone] option 4.     Si Usted Necesita Algo Despus de Su Visita  Tambin puede enviarnos un mensaje a travs de MyChart. Por lo general respondemos a los mensajes de MyChart en el transcurso de 1 a 2 das hbiles.  Para renovar recetas, por favor pida a su farmacia que se ponga en contacto con nuestra oficina. Nuestro nmero de fax es el [phone].  Si tiene un asunto urgente cuando la clnica est cerrada y que no puede esperar hasta el siguiente da hbil, puede llamar/localizar a su doctor(a) al nmero que aparece a continuacin.   Por favor, tenga en cuenta que aunque hacemos todo lo posible para estar disponibles para asuntos urgentes fuera del horario de oficina, no estamos disponibles las 24 horas del da, los 7 das de la semana.   Si tiene un problema urgente y no puede comunicarse con nosotros, puede optar por buscar atencin mdica  en el consultorio de su doctor(a), en una clnica privada, en un centro de atencin urgente o en una sala de emergencias.  Si tiene una emergencia mdica, por favor llame inmediatamente al 911 o vaya a la sala de emergencias.  Nmeros de bper  - Dr. Kowalski: [phone]  - Dra. Moye: [phone]  - Dra. Stewart: [phone]  En caso de inclemencias del tiempo, por favor llame a nuestra lnea principal al [phone] para una actualizacin sobre el estado de cualquier retraso o cierre.  Consejos para la medicacin en dermatologa: Por favor, guarde las cajas en las que vienen los medicamentos de uso tpico para ayudarle a seguir las instrucciones sobre dnde y cmo usarlos. Las farmacias generalmente imprimen las instrucciones del medicamento slo en las cajas y no directamente en los tubos del medicamento.   Si  su medicamento es muy caro, por favor, pngase en contacto con nuestra oficina llamando al [phone] y presione la opcin 4 o envenos un mensaje a travs de MyChart.   No podemos decirle cul ser su copago por los medicamentos por adelantado ya que esto es diferente dependiendo de la cobertura de su seguro. Sin embargo, es posible que podamos encontrar un medicamento sustituto a menor costo o llenar un formulario para que el seguro cubra el medicamento que se considera necesario.   Si se requiere una autorizacin previa para que su compaa de seguros cubra su medicamento, por favor permtanos de 1 a 2 das hbiles para completar este proceso.  Los precios de los medicamentos varan con frecuencia dependiendo del lugar de dnde se surte la receta y alguna farmacias pueden ofrecer precios ms baratos.  El sitio web www.goodrx.com tiene cupones para medicamentos de diferentes farmacias. Los precios aqu no tienen en cuenta lo que podra costar con la ayuda del seguro (puede ser ms barato con   su seguro), pero el sitio web puede darle el precio si no utiliz ningn seguro.  - Puede imprimir el cupn correspondiente y llevarlo con su receta a la farmacia.  - Tambin puede pasar por nuestra oficina durante el horario de atencin regular y recoger una tarjeta de cupones de GoodRx.  - Si necesita que su receta se enve electrnicamente a una farmacia diferente, informe a nuestra oficina a travs de MyChart de [facility] o por telfono llamando al [phone] y presione la opcin 4.  

## 2022-05-19 ENCOUNTER — Encounter: Payer: Self-pay | Admitting: Dermatology

## 2022-06-24 DIAGNOSIS — E1151 Type 2 diabetes mellitus with diabetic peripheral angiopathy without gangrene: Secondary | ICD-10-CM | POA: Diagnosis not present

## 2022-06-24 DIAGNOSIS — D631 Anemia in chronic kidney disease: Secondary | ICD-10-CM | POA: Diagnosis not present

## 2022-06-24 DIAGNOSIS — I700 Atherosclerosis of aorta: Secondary | ICD-10-CM | POA: Diagnosis not present

## 2022-06-24 DIAGNOSIS — N185 Chronic kidney disease, stage 5: Secondary | ICD-10-CM | POA: Diagnosis not present

## 2022-06-24 DIAGNOSIS — E538 Deficiency of other specified B group vitamins: Secondary | ICD-10-CM | POA: Diagnosis not present

## 2022-07-29 DIAGNOSIS — I10 Essential (primary) hypertension: Secondary | ICD-10-CM | POA: Diagnosis not present

## 2022-07-29 DIAGNOSIS — N10 Acute tubulo-interstitial nephritis: Secondary | ICD-10-CM | POA: Diagnosis not present

## 2022-07-29 DIAGNOSIS — D631 Anemia in chronic kidney disease: Secondary | ICD-10-CM | POA: Diagnosis not present

## 2022-07-29 DIAGNOSIS — E8722 Chronic metabolic acidosis: Secondary | ICD-10-CM | POA: Diagnosis not present

## 2022-07-29 DIAGNOSIS — N185 Chronic kidney disease, stage 5: Secondary | ICD-10-CM | POA: Diagnosis not present

## 2022-08-25 DIAGNOSIS — E113293 Type 2 diabetes mellitus with mild nonproliferative diabetic retinopathy without macular edema, bilateral: Secondary | ICD-10-CM | POA: Diagnosis not present

## 2022-08-25 DIAGNOSIS — H04122 Dry eye syndrome of left lacrimal gland: Secondary | ICD-10-CM | POA: Diagnosis not present

## 2022-08-25 DIAGNOSIS — H43813 Vitreous degeneration, bilateral: Secondary | ICD-10-CM | POA: Diagnosis not present

## 2022-09-20 DIAGNOSIS — M170 Bilateral primary osteoarthritis of knee: Secondary | ICD-10-CM | POA: Diagnosis not present

## 2022-10-21 DIAGNOSIS — N185 Chronic kidney disease, stage 5: Secondary | ICD-10-CM | POA: Diagnosis not present

## 2022-10-21 DIAGNOSIS — E1151 Type 2 diabetes mellitus with diabetic peripheral angiopathy without gangrene: Secondary | ICD-10-CM | POA: Diagnosis not present

## 2022-10-21 DIAGNOSIS — N12 Tubulo-interstitial nephritis, not specified as acute or chronic: Secondary | ICD-10-CM | POA: Diagnosis not present

## 2022-10-21 DIAGNOSIS — I10 Essential (primary) hypertension: Secondary | ICD-10-CM | POA: Diagnosis not present

## 2022-10-21 DIAGNOSIS — R972 Elevated prostate specific antigen [PSA]: Secondary | ICD-10-CM | POA: Diagnosis not present

## 2022-10-21 DIAGNOSIS — I700 Atherosclerosis of aorta: Secondary | ICD-10-CM | POA: Diagnosis not present

## 2022-11-02 DIAGNOSIS — I10 Essential (primary) hypertension: Secondary | ICD-10-CM | POA: Diagnosis not present

## 2022-11-02 DIAGNOSIS — D631 Anemia in chronic kidney disease: Secondary | ICD-10-CM | POA: Diagnosis not present

## 2022-11-02 DIAGNOSIS — N185 Chronic kidney disease, stage 5: Secondary | ICD-10-CM | POA: Diagnosis not present

## 2022-11-02 DIAGNOSIS — N10 Acute tubulo-interstitial nephritis: Secondary | ICD-10-CM | POA: Diagnosis not present

## 2022-11-16 DIAGNOSIS — K080 Exfoliation of teeth due to systemic causes: Secondary | ICD-10-CM | POA: Diagnosis not present

## 2022-11-18 ENCOUNTER — Ambulatory Visit: Payer: Medicare Other | Admitting: Dermatology

## 2022-11-23 DIAGNOSIS — R972 Elevated prostate specific antigen [PSA]: Secondary | ICD-10-CM | POA: Diagnosis not present

## 2022-12-23 ENCOUNTER — Encounter: Payer: Self-pay | Admitting: Dermatology

## 2022-12-23 ENCOUNTER — Ambulatory Visit (INDEPENDENT_AMBULATORY_CARE_PROVIDER_SITE_OTHER): Payer: Medicare Other | Admitting: Dermatology

## 2022-12-23 VITALS — BP 167/94 | HR 71

## 2022-12-23 DIAGNOSIS — Z1283 Encounter for screening for malignant neoplasm of skin: Secondary | ICD-10-CM

## 2022-12-23 DIAGNOSIS — L821 Other seborrheic keratosis: Secondary | ICD-10-CM | POA: Diagnosis not present

## 2022-12-23 DIAGNOSIS — D1801 Hemangioma of skin and subcutaneous tissue: Secondary | ICD-10-CM

## 2022-12-23 DIAGNOSIS — L814 Other melanin hyperpigmentation: Secondary | ICD-10-CM | POA: Diagnosis not present

## 2022-12-23 DIAGNOSIS — L570 Actinic keratosis: Secondary | ICD-10-CM

## 2022-12-23 DIAGNOSIS — W908XXA Exposure to other nonionizing radiation, initial encounter: Secondary | ICD-10-CM | POA: Diagnosis not present

## 2022-12-23 DIAGNOSIS — D489 Neoplasm of uncertain behavior, unspecified: Secondary | ICD-10-CM

## 2022-12-23 DIAGNOSIS — D229 Melanocytic nevi, unspecified: Secondary | ICD-10-CM

## 2022-12-23 DIAGNOSIS — D2271 Melanocytic nevi of right lower limb, including hip: Secondary | ICD-10-CM | POA: Diagnosis not present

## 2022-12-23 DIAGNOSIS — X32XXXA Exposure to sunlight, initial encounter: Secondary | ICD-10-CM

## 2022-12-23 DIAGNOSIS — L578 Other skin changes due to chronic exposure to nonionizing radiation: Secondary | ICD-10-CM

## 2022-12-23 DIAGNOSIS — D239 Other benign neoplasm of skin, unspecified: Secondary | ICD-10-CM

## 2022-12-23 DIAGNOSIS — S50312A Abrasion of left elbow, initial encounter: Secondary | ICD-10-CM

## 2022-12-23 HISTORY — DX: Other benign neoplasm of skin, unspecified: D23.9

## 2022-12-23 NOTE — Progress Notes (Signed)
Follow-Up Visit   Subjective  Austin Goodwin is a 72 y.o. male who presents for the following: Skin Cancer Screening and Full Body Skin Exam Here today concerning some spots at scalp his wife noticed and would like checked.   The patient presents for Total-Body Skin Exam (TBSE) for skin cancer screening and mole check. The patient has spots, moles and lesions to be evaluated, some may be new or changing and the patient has concerns that these could be cancer.  The following portions of the chart were reviewed this encounter and updated as appropriate: medications, allergies, medical history  Review of Systems:  No other skin or systemic complaints except as noted in HPI or Assessment and Plan.  Objective  Well appearing patient in no apparent distress; mood and affect are within normal limits.  A full examination was performed including scalp, head, eyes, ears, nose, lips, neck, chest, axillae, abdomen, back, buttocks, bilateral upper extremities, bilateral lower extremities, hands, feet, fingers, toes, fingernails, and toenails. All findings within normal limits unless otherwise noted below.   Relevant physical exam findings are noted in the Assessment and Plan.  right posterior calf 0.4 cm medium dark brown thin papule        left hand x 2, right forehead x 1 (3) Erythematous thin papules/macules with gritty scale.     Assessment & Plan   LENTIGINES, SEBORRHEIC KERATOSES, HEMANGIOMAS - Benign normal skin lesions - Benign-appearing - Call for any changes  EXCORIATION Exam: Excoriation at left elbow  Treatment Plan: Recommend vaseline. Call if not resolving. Recheck at follow up  MELANOCYTIC NEVI - Tan-brown and/or pink-flesh-colored symmetric macules and papules - Benign appearing on exam today - Observation - Call clinic for new or changing moles - Recommend daily use of broad spectrum spf 30+ sunscreen to sun-exposed areas.   ACTINIC DAMAGE - Chronic  condition, secondary to cumulative UV/sun exposure - diffuse scaly erythematous macules with underlying dyspigmentation - Recommend daily broad spectrum sunscreen SPF 30+ to sun-exposed areas, reapply every 2 hours as needed.  - Staying in the shade or wearing long sleeves, sun glasses (UVA+UVB protection) and wide brim hats (4-inch brim around the entire circumference of the hat) are also recommended for sun protection.  - Call for new or changing lesions.  SKIN CANCER SCREENING PERFORMED TODAY.  Neoplasm of uncertain behavior right posterior calf  Epidermal / dermal shaving  Lesion diameter (cm):  0.4 Informed consent: discussed and consent obtained   Timeout: patient name, date of birth, surgical site, and procedure verified   Patient was prepped and draped in usual sterile fashion: area prepped with isopropyl alcohol. 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 They are chronic with expected duration over 1 year. A portion of actinic keratoses will progress to squamous cell carcinoma of the skin. It is not possible to reliably predict which spots will  progress to skin cancer and so treatment is recommended to prevent development of skin cancer.  Recommend daily broad spectrum sunscreen SPF 30+ to sun-exposed areas, reapply every 2  hours as needed.  Recommend staying in the shade or wearing long sleeves, sun glasses (UVA+UVB protection) and wide brim hats (4-inch brim around the entire circumference of the hat). Call for new or changing lesions.  Prior to procedure, discussed risks of blister formation, small wound, skin dyspigmentation, or rare scar following cryotherapy. Recommend Vaseline ointment to treated areas while healing.   Destruction of lesion - left hand x 2, right forehead x 1  Destruction method: cryotherapy   Informed consent: discussed and consent obtained   Lesion destroyed using liquid nitrogen: Yes   Cryotherapy cycles:  2 Outcome: patient tolerated procedure well with no complications   Post-procedure details: wound care instructions given     Return for 3 - 4 month ak follow up.  I, Asher Muir, CMA, am acting as scribe for Darden Dates, MD.   Documentation: I have reviewed the above documentation for accuracy and completeness, and I agree with the above.  Darden Dates, MD

## 2022-12-23 NOTE — Patient Instructions (Addendum)
Can purchase blister bandaids found in drug store to use at affected wound.     Biopsy Wound Care Instructions  Leave the original bandage on for 24 hours if possible.  If the bandage becomes soaked or soiled before that time, it is OK to remove it and examine the wound.  A small amount of post-operative bleeding is normal.  If excessive bleeding occurs, remove the bandage, place gauze over the site and apply continuous pressure (no peeking) over the area for 30 minutes. If this does not work, please call our clinic as soon as possible or page your doctor if it is after hours.   Once a day, cleanse the wound with soap and water. It is fine to shower. If a thick crust develops you may use a Q-tip dipped into dilute hydrogen peroxide (mix 1:1 with water) to dissolve it.  Hydrogen peroxide can slow the healing process, so use it only as needed.    After washing, apply petroleum jelly (Vaseline) or an antibiotic ointment if your doctor prescribed one for you, followed by a bandage.    For best healing, the wound should be covered with a layer of ointment at all times. If you are not able to keep the area covered with a bandage to hold the ointment in place, this may mean re-applying the ointment several times a day.  Continue this wound care until the wound has healed and is no longer open.   Itching and mild discomfort is normal during the healing process. However, if you develop pain or severe itching, please call our office.   If you have any discomfort, you can take Tylenol (acetaminophen) or ibuprofen as directed on the bottle. (Please do not take these if you have an allergy to them or cannot take them for another reason).  Some redness, tenderness and white or yellow material in the wound is normal healing.  If the area becomes very sore and red, or develops a thick yellow-green material (pus), it may be infected; please notify us.    If you have stitches, return to clinic as directed to have  the stitches removed. You will continue wound care for 2-3 days after the stitches are removed.   Wound healing continues for up to one year following surgery. It is not unusual to experience pain in the scar from time to time during the interval.  If the pain becomes severe or the scar thickens, you should notify the office.    A slight amount of redness in a scar is expected for the first six months.  After six months, the redness will fade and the scar will soften and fade.  The color difference becomes less noticeable with time.  If there are any problems, return for a post-op surgery check at your earliest convenience.  To improve the appearance of the scar, you can use silicone scar gel, cream, or sheets (such as Mederma or Serica) every night for up to one year. These are available over the counter (without a prescription).  Please call our office at [phone] for any questions or concerns.     Recommend taking Heliocare sun protection supplement daily in sunny weather for additional sun protection. For maximum protection on the sunniest days, you can take up to 2 capsules of regular Heliocare OR take 1 capsule of Heliocare Ultra. For prolonged exposure (such as a full day in the sun), you can repeat your dose of the supplement 4 hours after your first dose. Heliocare  can be purchased at [employer], at some [facility] or at [email or web address].         Actinic keratoses are precancerous spots that appear secondary to cumulative UV radiation exposure/sun exposure over time. They are chronic with expected duration over 1 year. A portion of actinic keratoses will progress to squamous cell carcinoma of the skin. It is not possible to reliably predict which spots will progress to skin cancer and so treatment is recommended to prevent development of skin cancer.  Recommend daily broad spectrum sunscreen SPF 30+ to sun-exposed areas, reapply every 2 hours as needed.  Recommend  staying in the shade or wearing long sleeves, sun glasses (UVA+UVB protection) and wide brim hats (4-inch brim around the entire circumference of the hat). Call for new or changing lesions.   Cryotherapy Aftercare  Wash gently with soap and water everyday.   Apply Vaseline and Band-Aid daily until healed.    Melanoma ABCDEs  Melanoma is the most dangerous type of skin cancer, and is the leading cause of death from skin disease.  You are more likely to develop melanoma if you: Have light-colored skin, light-colored eyes, or red or blond hair Spend a lot of time in the sun Tan regularly, either outdoors or in a tanning bed Have had blistering sunburns, especially during childhood Have a close family member who has had a melanoma Have atypical moles or large birthmarks  Early detection of melanoma is key since treatment is typically straightforward and cure rates are extremely high if we catch it early.   The first sign of melanoma is often a change in a mole or a new dark spot.  The ABCDE system is a way of remembering the signs of melanoma.  A for asymmetry:  The two halves do not match. B for border:  The edges of the growth are irregular. C for color:  A mixture of colors are present instead of an even brown color. D for diameter:  Melanomas are usually (but not always) greater than 6mm - the size of a pencil eraser. E for evolution:  The spot keeps changing in size, shape, and color.  Please check your skin once per month between visits. You can use a small mirror in front and a large mirror behind you to keep an eye on the back side or your body.   If you see any new or changing lesions before your next follow-up, please call to schedule a visit.  Please continue daily skin protection including broad spectrum sunscreen SPF 30+ to sun-exposed areas, reapplying every 2 hours as needed when you're outdoors.   Staying in the shade or wearing long sleeves, sun glasses (UVA+UVB  protection) and wide brim hats (4-inch brim around the entire circumference of the hat) are also recommended for sun protection.     Due to recent changes in healthcare laws, you may see results of your pathology and/or laboratory studies on MyChart before the doctors have had a chance to review them. We understand that in some cases there may be results that are confusing or concerning to you. Please understand that not all results are received at the same time and often the doctors may need to interpret multiple results in order to provide you with the best plan of care or course of treatment. Therefore, we ask that you please give [place] 2 business days to thoroughly review all your results before contacting the office for clarification. Should we see a critical lab result, you will  be contacted sooner.   If You Need Anything After Your Visit  If you have any questions or concerns for your doctor, please call our main line at [phone] and press option 4 to reach your doctor's medical assistant. If no one answers, please leave a voicemail as directed and we will return your call as soon as possible. Messages left after 4 pm will be answered the following business day.   You may also send [place] a message via MyChart. We typically respond to MyChart messages within 1-2 business days.  For prescription refills, please ask your pharmacy to contact our office. Our fax number is [phone].  If you have an urgent issue when the clinic is closed that cannot wait until the next business day, you can page your doctor at the number below.    Please note that while we do our best to be available for urgent issues outside of office hours, we are not available 24/7.   If you have an urgent issue and are unable to reach [place], you may choose to seek medical care at your doctor's office, retail clinic, urgent care center, or emergency room.  If you have a medical emergency, please immediately call 911 or go to  the emergency department.  Pager Numbers  - Dr. Gwen Pounds: [phone]  - Dr. Neale Burly: [phone]  - Dr. Roseanne Reno: [phone]  In the event of inclement weather, please call our main line at [phone] for an update on the status of any delays or closures.  Dermatology Medication Tips: Please keep the boxes that topical medications come in in order to help keep track of the instructions about where and how to use these. Pharmacies typically print the medication instructions only on the boxes and not directly on the medication tubes.   If your medication is too expensive, please contact our office at [phone] option 4 or send [place] a message through MyChart.   We are unable to tell what your co-pay for medications will be in advance as this is different depending on your insurance coverage. However, we may be able to find a substitute medication at lower cost or fill out paperwork to get insurance to cover a needed medication.   If a prior authorization is required to get your medication covered by your insurance company, please allow [place] 1-2 business days to complete this process.  Drug prices often vary depending on where the prescription is filled and some pharmacies may offer cheaper prices.  The website www.goodrx.com contains coupons for medications through different pharmacies. The prices here do not account for what the cost may be with help from insurance (it may be cheaper with your insurance), but the website can give you the price if you did not use any insurance.  - You can print the associated coupon and take it with your prescription to the pharmacy.  - You may also stop by our office during regular business hours and pick up a GoodRx coupon card.  - If you need your prescription sent electronically to a different pharmacy, notify our office through [facility] or by phone at [phone] option 4.     Si Usted Necesita Algo Despus de Su Visita  Tambin puede  enviarnos un mensaje a travs de Clinical cytogeneticist. Por lo general respondemos a los mensajes de MyChart en el transcurso de 1 a 2 das hbiles.  Para renovar recetas, por favor pida a su farmacia que se ponga en contacto con nuestra oficina. Annie Sable de fax  es el [phone].  Si tiene un asunto urgente cuando la clnica est cerrada y que no puede esperar hasta el siguiente da hbil, puede llamar/localizar a su doctor(a) al nmero que aparece a continuacin.   Por favor, tenga en cuenta que aunque hacemos todo lo posible para estar disponibles para asuntos urgentes fuera del horario de [place], no estamos disponibles las 24 horas del da, los 7 [address]  [address] de la [place].   Si tiene un problema urgente y no puede comunicarse con nosotros, puede optar por buscar atencin mdica  en el consultorio de su doctor(a), en una clnica privada, en un centro de atencin urgente o en una sala de emergencias.  Si tiene Engineer, drilling, por favor llame inmediatamente al 911 o vaya a la sala de emergencias.  Nmeros de bper  - Dr. Gwen Pounds: [phone]  - Dra. Moye: [phone]  - Dra. Roseanne Reno: [phone]  En caso de inclemencias del [place], por favor llame a Lacy Duverney principal al [phone] para una actualizacin sobre el [place] de cualquier retraso o cierre.  Consejos para la medicacin en dermatologa: Por favor, guarde las cajas en las que vienen los medicamentos de uso tpico para ayudarle a seguir las instrucciones sobre dnde y cmo usarlos. Las farmacias generalmente imprimen las instrucciones del medicamento slo en las cajas y no directamente en los tubos del [place].   Si su medicamento es muy caro, por favor, pngase en contacto con Rolm Gala llamando al [phone] y presione la opcin 4 o envenos un mensaje a travs de Clinical cytogeneticist.   No podemos decirle cul ser su copago por los medicamentos por adelantado ya que esto es diferente dependiendo de la cobertura de su seguro.  Sin embargo, es posible que podamos encontrar un medicamento sustituto a Audiological scientist un formulario para que el seguro cubra el medicamento que se considera necesario.   Si se requiere una autorizacin previa para que su compaa de seguros [place] su medicamento, por favor permtanos de 1 a 2 das hbiles para completar [address]  Los precios de los medicamentos varan con frecuencia dependiendo del Environmental consultant de dnde se surte la receta y alguna farmacias pueden ofrecer precios ms baratos.  El sitio web www.goodrx.com tiene cupones para medicamentos de Health and safety inspector. Los precios aqu no tienen en cuenta lo que podra costar con la ayuda del seguro (puede ser ms barato con su seguro), pero el sitio web puede darle el precio si no utiliz Tourist information centre manager.  - Puede imprimir el cupn correspondiente y llevarlo con su receta a la farmacia.  - Tambin puede pasar por nuestra oficina durante el horario de atencin regular y Education officer, museum una tarjeta de cupones de GoodRx.  - Si necesita que su receta se enve electrnicamente a una farmacia diferente, informe a nuestra oficina a travs de MyChart de [place] o por telfono llamando al [phone] y presione la opcin 4.

## 2022-12-30 ENCOUNTER — Telehealth: Payer: Self-pay

## 2022-12-30 NOTE — Telephone Encounter (Signed)
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 Katrinka Blazing  This is a MILDLY ATYPICAL MOLE. On the spectrum from normal mole to melanoma skin cancer, this is in between but it is much closer to a normal mole.  - These typically do not progress to melanoma or cause any trouble.  - People who have a history of atypical moles do have a slightly increased risk of developing melanoma somewhere on the body, so a yearly full body skin exam by a dermatologist is recommended.  - Monthly self skin checks and daily sun protection are also recommended.  - Please call if you notice a dark spot coming back where this biopsy was taken.  - Please also call if you notice any new or changing spots anywhere else on the body before your follow-up visit.  - Please call our office or send [place] a message if you have any questions or concerns about this biopsy result.      MAs please call. Thank you!

## 2022-12-30 NOTE — Telephone Encounter (Signed)
Left voicemail to return my call

## 2023-01-03 ENCOUNTER — Telehealth: Payer: Self-pay

## 2023-01-03 NOTE — Telephone Encounter (Addendum)
Called and spoke to patient's wife with patient. Discussed bx results and will recheck at patient's next follow. Verbalized understanding and denied further questions.    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 Katrinka Blazing  This is a MILDLY ATYPICAL MOLE. On the spectrum from normal mole to melanoma skin cancer, this is in between but it is much closer to a normal mole.  - These typically do not progress to melanoma or cause any trouble.  - People who have a history of atypical moles do have a slightly increased risk of developing melanoma somewhere on the body, so a yearly full body skin exam by a dermatologist is recommended.  - Monthly self skin checks and daily sun protection are also recommended.  - Please call if you notice a dark spot coming back where this biopsy was taken.  - Please also call if you notice any new or changing spots anywhere else on the body before your follow-up visit.  - Please call our office or send [place] a message if you have any questions or concerns about this biopsy result.      MAs please call. Thank you!

## 2023-01-12 DIAGNOSIS — M7989 Other specified soft tissue disorders: Secondary | ICD-10-CM | POA: Diagnosis not present

## 2023-01-12 DIAGNOSIS — R0602 Shortness of breath: Secondary | ICD-10-CM | POA: Diagnosis not present

## 2023-01-12 DIAGNOSIS — R61 Generalized hyperhidrosis: Secondary | ICD-10-CM | POA: Diagnosis not present

## 2023-01-12 DIAGNOSIS — N185 Chronic kidney disease, stage 5: Secondary | ICD-10-CM | POA: Diagnosis not present

## 2023-01-12 DIAGNOSIS — R059 Cough, unspecified: Secondary | ICD-10-CM | POA: Diagnosis not present

## 2023-01-12 DIAGNOSIS — Z7984 Long term (current) use of oral hypoglycemic drugs: Secondary | ICD-10-CM | POA: Diagnosis not present

## 2023-01-12 DIAGNOSIS — Z87891 Personal history of nicotine dependence: Secondary | ICD-10-CM | POA: Diagnosis not present

## 2023-01-12 DIAGNOSIS — R079 Chest pain, unspecified: Secondary | ICD-10-CM | POA: Diagnosis not present

## 2023-01-12 DIAGNOSIS — K219 Gastro-esophageal reflux disease without esophagitis: Secondary | ICD-10-CM | POA: Diagnosis not present

## 2023-01-12 DIAGNOSIS — E1122 Type 2 diabetes mellitus with diabetic chronic kidney disease: Secondary | ICD-10-CM | POA: Diagnosis not present

## 2023-01-12 DIAGNOSIS — R918 Other nonspecific abnormal finding of lung field: Secondary | ICD-10-CM | POA: Diagnosis not present

## 2023-01-12 DIAGNOSIS — I120 Hypertensive chronic kidney disease with stage 5 chronic kidney disease or end stage renal disease: Secondary | ICD-10-CM | POA: Diagnosis not present

## 2023-01-13 DIAGNOSIS — J449 Chronic obstructive pulmonary disease, unspecified: Secondary | ICD-10-CM | POA: Diagnosis not present

## 2023-01-13 DIAGNOSIS — I6529 Occlusion and stenosis of unspecified carotid artery: Secondary | ICD-10-CM | POA: Diagnosis not present

## 2023-01-18 DIAGNOSIS — R079 Chest pain, unspecified: Secondary | ICD-10-CM | POA: Diagnosis not present

## 2023-02-02 DIAGNOSIS — E1122 Type 2 diabetes mellitus with diabetic chronic kidney disease: Secondary | ICD-10-CM | POA: Diagnosis not present

## 2023-02-02 DIAGNOSIS — N184 Chronic kidney disease, stage 4 (severe): Secondary | ICD-10-CM | POA: Diagnosis not present

## 2023-02-02 DIAGNOSIS — N2581 Secondary hyperparathyroidism of renal origin: Secondary | ICD-10-CM | POA: Diagnosis not present

## 2023-02-02 DIAGNOSIS — I129 Hypertensive chronic kidney disease with stage 1 through stage 4 chronic kidney disease, or unspecified chronic kidney disease: Secondary | ICD-10-CM | POA: Diagnosis not present

## 2023-02-02 DIAGNOSIS — N189 Chronic kidney disease, unspecified: Secondary | ICD-10-CM | POA: Diagnosis not present

## 2023-03-22 DIAGNOSIS — N185 Chronic kidney disease, stage 5: Secondary | ICD-10-CM | POA: Diagnosis not present

## 2023-03-22 DIAGNOSIS — N2581 Secondary hyperparathyroidism of renal origin: Secondary | ICD-10-CM | POA: Diagnosis not present

## 2023-03-22 DIAGNOSIS — E790 Hyperuricemia without signs of inflammatory arthritis and tophaceous disease: Secondary | ICD-10-CM | POA: Diagnosis not present

## 2023-03-22 DIAGNOSIS — N119 Chronic tubulo-interstitial nephritis, unspecified: Secondary | ICD-10-CM | POA: Diagnosis not present

## 2023-03-31 NOTE — Progress Notes (Signed)
Cardiology Office Note  Date:  04/01/2023   ID:  Austin Goodwin, DOB 1951-02-22, MRN [id number]  PCP:  Adrian Prince, MD   Chief Complaint  Patient presents with   New Patient (Initial Visit)    Ref by Dr. Adrian Prince for PAD & heart murmur. 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 Medications reviewed by the patient verbally.     HPI:  Austin Goodwin is a 72 year old gentleman with past medical history of End-stage renal disease on hemodialysis, quit 2-3 years ago CR 4.4 Hyperlipidemia PAD, carotid stenosis, occluded on left 100% in 2011 Essential hypertension Diabetes type 2 Former smoker, quit 2011 Who presents by referral from Dr. Evlyn Kanner for PAD  2011: Reports he was acutely disoriented , dizzy Was evaluated in the hospital/[facility] Noted to have carotid stenosis, 100% stenosis on left  Quit smoking at that time  Carotid ultrasound May 2023 Right Carotid: Velocities in the right ICA are consistent with a 1-39%  stenosis.  Left Carotid: Evidence consistent with a total occlusion of the left ICA.   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 On exertion unable to reproduce chest pain or shortness of breath symptoms  Reports he took himself off dialysis 2 to 3 years ago, makes urine Previously had ankle swelling On torsemide and metolazone daily, ankle swelling resolved  Recent lipid panel unavailable Tolerating Crestor 10 2 days a week Reports he has never tried daily  EKG personally reviewed by myself on todays visit EKG Interpretation Date/Time:  Friday April 01 2023 10:09:09 EDT Ventricular Rate:  77 PR Interval:  200 QRS Duration:  116 QT Interval:  418 QTC Calculation: 473 R Axis:   -1  Text Interpretation: Normal sinus rhythm Minimal voltage criteria for LVH, may be normal variant ( Cornell product ) When compared with ECG of 02-Jul-2019 13:12, QRS duration has increased Nonspecific  T wave abnormality, worse in Inferior leads Confirmed by Julien Nordmann [id number]) on 04/01/2023 1:36:03 PM    PMH:   has a past medical history of Diabetes mellitus without complication (HCC), Dysplastic nevus (12/23/2022), and Hypertension.  PSH:    Past Surgical History:  Procedure Laterality Date   CATARACT EXTRACTION, BILATERAL     DIALYSIS/PERMA CATHETER INSERTION N/A 08/10/2019   Procedure: DIALYSIS/PERMA CATHETER INSERTION;  Surgeon: Annice Needy, MD;  Location: [facility] INVASIVE CV LAB;  Service: Cardiovascular;  Laterality: N/A;   DIALYSIS/PERMA CATHETER REMOVAL N/A 09/24/2019   Procedure: DIALYSIS/PERMA CATHETER REMOVAL;  Surgeon: Annice Needy, MD;  Location: [facility] INVASIVE CV LAB;  Service: Cardiovascular;  Laterality: N/A;    Current Outpatient Medications  Medication Sig Dispense Refill   ADVAIR DISKUS 100-50 MCG/ACT AEPB Inhale 1 puff into the lungs 2 (two) times daily.     allopurinol (ZYLOPRIM) 100 MG tablet Take 100 mg by mouth daily.     amLODipine (NORVASC) 10 MG tablet Take 1 tablet (10 mg total) by mouth daily. 30 tablet 0   aspirin 81 MG chewable tablet Chew 81 mg by mouth at bedtime.     cyanocobalamin 1000 MCG tablet Take 1,000 mcg by mouth daily.     doxazosin (CARDURA) 4 MG tablet Take 4 mg by mouth daily.     glipiZIDE (GLUCOTROL) 5 MG tablet Take 5 mg by mouth 4 (four) times daily.     metolazone (ZAROXOLYN) 2.5 MG tablet Take 2.5 mg by mouth daily.     Multiple  Vitamin (MULTIVITAMIN) tablet Take 1 tablet by mouth daily.     omeprazole (PRILOSEC) 20 MG capsule Take 1 capsule by mouth daily.     rosuvastatin (CRESTOR) 10 MG tablet Take 10 mg by mouth 2 (two) times a week. Wednesday & Sunday     sodium bicarbonate 650 MG tablet Take 1,300 mg by mouth 2 (two) times daily.     Testosterone 20.25 MG/ACT (1.62%) GEL Apply topically.     torsemide (DEMADEX) 20 MG tablet Take 20 mg by mouth daily.     traMADol (ULTRAM) 50 MG tablet Take 50 mg by mouth as needed for pain.      zolpidem (AMBIEN) 5 MG tablet Take 5 mg by mouth as needed.     No current facility-administered medications for this visit.    Allergies:   Levofloxacin   Social History:  The patient  reports that he quit smoking about 23 years ago. His smoking use included cigarettes. He has never used smokeless tobacco. He reports current alcohol use of about 1.0 standard drink of alcohol per week. He reports that he does not use drugs.   Family History:   family history includes Angina in his maternal grandmother; Angina (age of onset: 27) in his father; Cancer in his paternal grandmother; Heart disease in his father; Mitral valve prolapse in his mother.    Review of Systems: Review of Systems  Constitutional: Negative.   HENT: Negative.    Respiratory: Negative.    Cardiovascular: Negative.   Gastrointestinal: Negative.   Musculoskeletal: Negative.   Neurological: Negative.   Psychiatric/Behavioral: Negative.    All other systems reviewed and are negative.   PHYSICAL EXAM: VS:  BP (!) 140/60 (BP Location: Left Arm, Patient Position: Sitting, Cuff Size: Normal)   Pulse 77   Ht 5\' 11"  (1.803 m)   Wt 197 lb (89.4 kg)   SpO2 97%   BMI 27.48 kg/m  , BMI Body mass index is 27.48 kg/m. GEN: Well nourished, well developed, in no acute distress HEENT: normal Neck: no JVD, carotid bruits, or masses Cardiac: RRR; No  murmurs, rubs, or gallops,no edema  Respiratory:  clear to auscultation bilaterally, normal work of breathing GI: soft, nontender, nondistended, + BS MS: no deformity or atrophy Skin: warm and dry, no rash Neuro:  Strength and sensation are intact Psych: euthymic mood, full affect   Recent Labs: No results found for requested labs within last 365 days.    Lipid Panel No results found for: "CHOL", "HDL", "LDLCALC", "TRIG"    Wt Readings from Last 3 Encounters:  04/01/23 197 lb (89.4 kg)  10/07/21 212 lb 8 oz (96.4 kg)  02/04/21 198 lb 1.6 oz (89.9 kg)     ASSESSMENT  AND PLAN:  Problem List Items Addressed This Visit       Cardiology Problems   Hyperlipidemia   Relevant Medications   doxazosin (CARDURA) 4 MG tablet   metolazone (ZAROXOLYN) 2.5 MG tablet   Essential hypertension   Relevant Medications   doxazosin (CARDURA) 4 MG tablet   metolazone (ZAROXOLYN) 2.5 MG tablet   Other Relevant Orders   EKG 12-Lead (Completed)     Other   CKD stage 4 due to type 2 diabetes mellitus (HCC)   Other Visit Diagnoses     PAD (peripheral artery disease) (HCC)    -  Primary   Relevant Medications   doxazosin (CARDURA) 4 MG tablet   metolazone (ZAROXOLYN) 2.5 MG tablet   Other Relevant Orders  EKG 12-Lead (Completed)   Bilateral carotid artery stenosis       Relevant Medications   doxazosin (CARDURA) 4 MG tablet   metolazone (ZAROXOLYN) 2.5 MG tablet   Other Relevant Orders   EKG 12-Lead (Completed)   Diabetes mellitus type 2 with complications (HCC)          PAD 100% carotid stenosis on left, nonobstructive on right On aspirin 81 daily Recommended Crestor 10 daily, goal LDL less than 55 Will defer follow-up lab work to Dr. Evlyn Kanner Denies claudication symptoms  Chronic kidney disease stage IV Previously on dialysis, stopped 2 to 3 years ago Creatinine greater than 4 Ankle swelling resolved with torsemide daily and metolazone, managed by nephrology  Diabetes type 2 with complications Recent A1c unavailable Discussed the association between diabetes and progression of PAD Recommend close follow-up with Dr. Evlyn Kanner  Cardiac risk factors Remote history of smoking, stop 2011 Recommended LDL less than 55, strict diabetes control No murmur appreciated on exam Denies chest pain or shortness of breath concerning for angina For any anginal symptoms, would recommend Myoview Would avoid cardiac CTA and hope to avoid heart catheterization in the setting of chronic kidney disease stage IV  Essential hypertension Blood pressure high end of   range Recommend close monitoring of pressure at home Additional options if blood pressure continues to run high would be change Cardura to twice daily dosing, add hydralazine, add isosorbide   Total encounter time more than 60 minutes  Greater than 50% was spent in counseling and coordination of care with the patient    Signed, Dossie Arbour, M.D., Ph.D. [facility] [place], [place] [phone]

## 2023-04-01 ENCOUNTER — Encounter: Payer: Self-pay | Admitting: Cardiovascular Disease

## 2023-04-01 ENCOUNTER — Ambulatory Visit: Payer: Medicare Other | Attending: Cardiovascular Disease | Admitting: Cardiovascular Disease

## 2023-04-01 VITALS — BP 140/60 | HR 77 | Ht 71.0 in | Wt 197.0 lb

## 2023-04-01 DIAGNOSIS — E118 Type 2 diabetes mellitus with unspecified complications: Secondary | ICD-10-CM | POA: Diagnosis not present

## 2023-04-01 DIAGNOSIS — Z7984 Long term (current) use of oral hypoglycemic drugs: Secondary | ICD-10-CM

## 2023-04-01 DIAGNOSIS — I6523 Occlusion and stenosis of bilateral carotid arteries: Secondary | ICD-10-CM

## 2023-04-01 DIAGNOSIS — E782 Mixed hyperlipidemia: Secondary | ICD-10-CM | POA: Diagnosis not present

## 2023-04-01 DIAGNOSIS — N184 Chronic kidney disease, stage 4 (severe): Secondary | ICD-10-CM

## 2023-04-01 DIAGNOSIS — I10 Essential (primary) hypertension: Secondary | ICD-10-CM

## 2023-04-01 DIAGNOSIS — I739 Peripheral vascular disease, unspecified: Secondary | ICD-10-CM

## 2023-04-01 DIAGNOSIS — E1122 Type 2 diabetes mellitus with diabetic chronic kidney disease: Secondary | ICD-10-CM

## 2023-04-01 MED ORDER — ROSUVASTATIN CALCIUM 10 MG PO TABS: 10.0000 mg | ORAL_TABLET | Freq: Every day | ORAL | 3 refills | Status: DC

## 2023-04-01 NOTE — Patient Instructions (Addendum)
Medication Instructions:  Please take Crestor 10 mg daily.   If you need a refill on your cardiac medications before your next appointment, please call your pharmacy.   Lab work: No new labs needed  Testing/Procedures: No new testing needed  Follow-Up: At [facility], you and your health needs are our priority.  As part of our continuing mission to provide you with exceptional heart care, we have created designated Provider Care Teams.  These Care Teams include your primary Cardiologist (physician) and Advanced Practice Providers (APPs -  Physician Assistants and Nurse Practitioners) who all work together to provide you with the care you need, when you need it.  You will need a follow up appointment in 12 months  Providers on your designated Care Team:   Nicolasa Ducking, NP Eula Listen, PA-C Cadence Fransico Michael, [place]  COVID-19 Vaccine Information can be found at: [email or web address] For questions related to vaccine distribution or appointments, please email [email or web address] or call [phone].

## 2023-04-18 ENCOUNTER — Ambulatory Visit: Payer: Medicare Other | Admitting: Dermatology

## 2023-05-02 DIAGNOSIS — Z125 Encounter for screening for malignant neoplasm of prostate: Secondary | ICD-10-CM | POA: Diagnosis not present

## 2023-05-02 DIAGNOSIS — C61 Malignant neoplasm of prostate: Secondary | ICD-10-CM | POA: Diagnosis not present

## 2023-05-02 DIAGNOSIS — R972 Elevated prostate specific antigen [PSA]: Secondary | ICD-10-CM | POA: Diagnosis not present

## 2023-05-02 DIAGNOSIS — N4232 Atypical small acinar proliferation of prostate: Secondary | ICD-10-CM | POA: Diagnosis not present

## 2023-05-16 DIAGNOSIS — C61 Malignant neoplasm of prostate: Secondary | ICD-10-CM | POA: Diagnosis not present

## 2023-05-17 DIAGNOSIS — Z1331 Encounter for screening for depression: Secondary | ICD-10-CM | POA: Diagnosis not present

## 2023-05-17 DIAGNOSIS — G4700 Insomnia, unspecified: Secondary | ICD-10-CM | POA: Diagnosis not present

## 2023-05-17 DIAGNOSIS — E785 Hyperlipidemia, unspecified: Secondary | ICD-10-CM | POA: Diagnosis not present

## 2023-05-17 DIAGNOSIS — D631 Anemia in chronic kidney disease: Secondary | ICD-10-CM | POA: Diagnosis not present

## 2023-05-17 DIAGNOSIS — D472 Monoclonal gammopathy: Secondary | ICD-10-CM | POA: Diagnosis not present

## 2023-05-17 DIAGNOSIS — Z0000 Encounter for general adult medical examination without abnormal findings: Secondary | ICD-10-CM | POA: Diagnosis not present

## 2023-05-17 DIAGNOSIS — J432 Centrilobular emphysema: Secondary | ICD-10-CM | POA: Diagnosis not present

## 2023-05-17 DIAGNOSIS — Z1339 Encounter for screening examination for other mental health and behavioral disorders: Secondary | ICD-10-CM | POA: Diagnosis not present

## 2023-05-17 DIAGNOSIS — I10 Essential (primary) hypertension: Secondary | ICD-10-CM | POA: Diagnosis not present

## 2023-05-17 DIAGNOSIS — E1151 Type 2 diabetes mellitus with diabetic peripheral angiopathy without gangrene: Secondary | ICD-10-CM | POA: Diagnosis not present

## 2023-05-23 DIAGNOSIS — I10 Essential (primary) hypertension: Secondary | ICD-10-CM | POA: Diagnosis not present

## 2023-05-23 DIAGNOSIS — N185 Chronic kidney disease, stage 5: Secondary | ICD-10-CM | POA: Diagnosis not present

## 2023-05-23 DIAGNOSIS — N189 Chronic kidney disease, unspecified: Secondary | ICD-10-CM | POA: Diagnosis not present

## 2023-05-23 DIAGNOSIS — N119 Chronic tubulo-interstitial nephritis, unspecified: Secondary | ICD-10-CM | POA: Diagnosis not present

## 2023-05-23 DIAGNOSIS — D472 Monoclonal gammopathy: Secondary | ICD-10-CM | POA: Diagnosis not present

## 2023-05-23 DIAGNOSIS — E1122 Type 2 diabetes mellitus with diabetic chronic kidney disease: Secondary | ICD-10-CM | POA: Diagnosis not present

## 2023-06-06 ENCOUNTER — Encounter: Payer: Self-pay | Admitting: Radiation Oncology

## 2023-06-06 ENCOUNTER — Ambulatory Visit
Admission: RE | Admit: 2023-06-06 | Discharge: 2023-06-06 | Disposition: A | Discharge status: Home / Self Care | Payer: Medicare Other | Source: Ambulatory Visit | Attending: Radiation Oncology | Admitting: Radiation Oncology

## 2023-06-06 ENCOUNTER — Other Ambulatory Visit: Payer: Self-pay | Admitting: *Deleted

## 2023-06-06 VITALS — BP 158/72 | HR 81 | Temp 97.1°F | Resp 16 | Wt 197.0 lb

## 2023-06-06 DIAGNOSIS — Z7982 Long term (current) use of aspirin: Secondary | ICD-10-CM | POA: Insufficient documentation

## 2023-06-06 DIAGNOSIS — C61 Malignant neoplasm of prostate: Secondary | ICD-10-CM | POA: Diagnosis not present

## 2023-06-06 DIAGNOSIS — I10 Essential (primary) hypertension: Secondary | ICD-10-CM | POA: Insufficient documentation

## 2023-06-06 DIAGNOSIS — Z7984 Long term (current) use of oral hypoglycemic drugs: Secondary | ICD-10-CM | POA: Insufficient documentation

## 2023-06-06 DIAGNOSIS — N119 Chronic tubulo-interstitial nephritis, unspecified: Secondary | ICD-10-CM | POA: Diagnosis not present

## 2023-06-06 DIAGNOSIS — Z87891 Personal history of nicotine dependence: Secondary | ICD-10-CM | POA: Diagnosis not present

## 2023-06-06 DIAGNOSIS — Z923 Personal history of irradiation: Secondary | ICD-10-CM | POA: Diagnosis not present

## 2023-06-06 DIAGNOSIS — Z79899 Other long term (current) drug therapy: Secondary | ICD-10-CM | POA: Diagnosis not present

## 2023-06-06 DIAGNOSIS — D472 Monoclonal gammopathy: Secondary | ICD-10-CM | POA: Diagnosis not present

## 2023-06-06 DIAGNOSIS — E119 Type 2 diabetes mellitus without complications: Secondary | ICD-10-CM | POA: Insufficient documentation

## 2023-06-06 DIAGNOSIS — E1122 Type 2 diabetes mellitus with diabetic chronic kidney disease: Secondary | ICD-10-CM | POA: Diagnosis not present

## 2023-06-06 DIAGNOSIS — N185 Chronic kidney disease, stage 5: Secondary | ICD-10-CM | POA: Diagnosis not present

## 2023-06-06 DIAGNOSIS — Z191 Hormone sensitive malignancy status: Secondary | ICD-10-CM | POA: Diagnosis not present

## 2023-06-06 MED ORDER — TAMSULOSIN HCL 0.4 MG PO CAPS: 0.4000 mg | ORAL_CAPSULE | Freq: Every day | ORAL | 0 refills | Status: DC

## 2023-06-06 NOTE — Consult Note (Signed)
NEW PATIENT EVALUATION  Name: Austin Goodwin  MRN: [id number]  Date:   06/06/2023     DOB: 1950-12-23   This 72 y.o. 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  REFERRING PHYSICIAN: Adrian Prince, MD  CHIEF COMPLAINT:  Chief Complaint  Patient presents with   Prostate Cancer    DIAGNOSIS: The encounter diagnosis was Prostate cancer [facility]).   PREVIOUS INVESTIGATIONS:  MRI scan reviewed Pathology reports reviewed   HPI: Patient is a 72 year old male who has been followed for several years by urology elevated PSA.  MRI scan back in April showing a PI-RADS category 5 lesion majority the right and left peripheral zone.  There was substantial capsular contact the regions of interest potentially slight effacement.  The prostate gland and the rectum raising concern for early Trans capsular spread.  He did eventually undergo UroNav biopsy showing 10 of 12 cores positive for Gleason 7 (3+4).  PSA was 8.  He was seen by Dr. Logan Bores health and recommendation for radiation was made.  Patient does have a history of renal insufficiency with only 14% renal function.  Pelvic MRI also showed no evidence of seminal vesicle involvement bone metastasis or adenopathy.  He is now referred to radiation oncology for consideration of treatment.  PLANNED TREATMENT REGIMEN: Image guided IMRT radiation therapy plus ADT therapy  PAST MEDICAL HISTORY:  has a past medical history of Diabetes mellitus without complication (HCC), Dysplastic nevus (12/23/2022), and Hypertension.    PAST SURGICAL HISTORY:  Past Surgical History:  Procedure Laterality Date   CATARACT EXTRACTION, BILATERAL     DIALYSIS/PERMA CATHETER INSERTION N/A 08/10/2019   Procedure: DIALYSIS/PERMA CATHETER INSERTION;  Surgeon: Annice Needy, MD;  Location: [facility] INVASIVE CV LAB;  Service: Cardiovascular;  Laterality: N/A;   DIALYSIS/PERMA  CATHETER REMOVAL N/A 09/24/2019   Procedure: DIALYSIS/PERMA CATHETER REMOVAL;  Surgeon: Annice Needy, MD;  Location: [facility] INVASIVE CV LAB;  Service: Cardiovascular;  Laterality: N/A;    FAMILY HISTORY: family history includes Angina in his maternal grandmother; Angina (age of onset: 67) in his father; Cancer in his paternal grandmother; Heart disease in his father; Mitral valve prolapse in his mother.  SOCIAL HISTORY:  reports that he quit smoking about 23 years ago. His smoking use included cigarettes. He has never used smokeless tobacco. He reports current alcohol use of about 1.0 standard drink of alcohol per week. He reports that he does not use drugs.  ALLERGIES: Levofloxacin  MEDICATIONS:  Current Outpatient Medications  Medication Sig Dispense Refill   ADVAIR DISKUS 100-50 MCG/ACT AEPB Inhale 1 puff into the lungs 2 (two) times daily.     allopurinol (ZYLOPRIM) 100 MG tablet Take 100 mg by mouth daily.     amLODipine (NORVASC) 10 MG tablet Take 1 tablet (10 mg total) by mouth daily. 30 tablet 0   aspirin 81 MG chewable tablet Chew 81 mg by mouth at bedtime.     cyanocobalamin 1000 MCG tablet Take 1,000 mcg by mouth daily.     doxazosin (CARDURA) 4 MG tablet Take 4 mg by mouth daily.     glipiZIDE (GLUCOTROL) 5 MG tablet Take 5 mg by mouth 4 (four) times daily.     metolazone (ZAROXOLYN) 2.5 MG tablet Take 2.5 mg by mouth daily.     Multiple Vitamin (MULTIVITAMIN) tablet Take 1 tablet by mouth daily.     omeprazole (PRILOSEC)  20 MG capsule Take 1 capsule by mouth daily.     rosuvastatin (CRESTOR) 10 MG tablet Take 1 tablet (10 mg total) by mouth daily. Wednesday & Sunday 90 tablet 3   sodium bicarbonate 650 MG tablet Take 1,300 mg by mouth 2 (two) times daily.     Testosterone 20.25 MG/ACT (1.62%) GEL Apply topically.     torsemide (DEMADEX) 20 MG tablet Take 20 mg by mouth daily.     traMADol (ULTRAM) 50 MG tablet Take 50 mg by mouth as needed for pain.     zolpidem (AMBIEN) 5 MG  tablet Take 5 mg by mouth as needed.     tamsulosin (FLOMAX) 0.4 MG CAPS capsule Take 1 capsule (0.4 mg total) by mouth daily after supper. 30 capsule 0   No current facility-administered medications for this encounter.    ECOG PERFORMANCE STATUS:  0 - Asymptomatic  REVIEW OF SYSTEMS: Patient denies any weight loss, fatigue, weakness, fever, chills or night sweats. Patient denies any loss of vision, blurred vision. Patient denies any ringing  of the ears or hearing loss. No irregular heartbeat. Patient denies heart murmur or history of fainting. Patient denies any chest pain or pain radiating to her upper extremities. Patient denies any shortness of breath, difficulty breathing at night, cough or hemoptysis. Patient denies any swelling in the lower legs. Patient denies any nausea vomiting, vomiting of blood, or coffee ground material in the vomitus. Patient denies any stomach pain. Patient states has had normal bowel movements no significant constipation or diarrhea. Patient denies any dysuria, hematuria or significant nocturia. Patient denies any problems walking, swelling in the joints or loss of balance. Patient denies any skin changes, loss of hair or loss of weight. Patient denies any excessive worrying or anxiety or significant depression. Patient denies any problems with insomnia. Patient denies excessive thirst, polyuria, polydipsia. Patient denies any swollen glands, patient denies easy bruising or easy bleeding. Patient denies any recent infections, allergies or URI. Patient "s visual fields have not changed significantly in recent time.   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  Well-developed well-nourished patient in NAD. HEENT reveals PERLA, EOMI, discs not visualized.  Oral cavity is clear. No oral mucosal  lesions are identified. Neck is clear without evidence of cervical or supraclavicular adenopathy. Lungs are clear to A&P. Cardiac examination is essentially unremarkable with regular rate and rhythm without murmur rub or thrill. Abdomen is benign with no organomegaly or masses noted. Motor sensory and DTR levels are equal and symmetric in the upper and lower extremities. Cranial nerves II through XII are grossly intact. Proprioception is intact. No peripheral adenopathy or edema is identified. No motor or sensory levels are noted. Crude visual fields are within normal range.  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  I would focus on his prostate and do image guided IMRT radiation therapy to 80 Gray over 8 weeks.  Risks and benefits of treatment clued increased lower urinary tract symptoms diarrhea fatigue alteration blood counts skin reaction all were reviewed with the patient.  I am asking urology to place fiducial marker his prostate for daily image guided treatment.  I would also like to perform 1 injection of Eligard 58-month depot be done concurrently with radiation.  Patient comprehends my recommendations well.  Appointment for simulation has been made.  I would like to take this opportunity to thank you for allowing me to participate in the care of your patient.Carmina Miller, MD

## 2023-06-10 ENCOUNTER — Telehealth: Payer: Self-pay

## 2023-06-10 NOTE — Telephone Encounter (Signed)
-----   Message from Nurse Cyril Mourning sent at 06/06/2023 10:10 AM EST ----- Patient needs an appointment with Dr. Lonna Cobb specifically for Markers and Eligard. Thanks, [employer]

## 2023-06-10 NOTE — Telephone Encounter (Signed)
Per [employer] no PA needed for Eligard.

## 2023-06-22 ENCOUNTER — Other Ambulatory Visit: Payer: Self-pay | Admitting: Radiation Oncology

## 2023-06-22 DIAGNOSIS — C801 Malignant (primary) neoplasm, unspecified: Secondary | ICD-10-CM

## 2023-06-28 ENCOUNTER — Other Ambulatory Visit: Payer: Self-pay

## 2023-06-28 DIAGNOSIS — C61 Malignant neoplasm of prostate: Secondary | ICD-10-CM

## 2023-06-28 MED ORDER — TAMSULOSIN HCL 0.4 MG PO CAPS: 0.4000 mg | ORAL_CAPSULE | Freq: Every day | ORAL | 0 refills | Status: DC

## 2023-06-30 ENCOUNTER — Ambulatory Visit (INDEPENDENT_AMBULATORY_CARE_PROVIDER_SITE_OTHER): Payer: Medicare Other | Admitting: Urology

## 2023-06-30 ENCOUNTER — Encounter: Payer: Self-pay | Admitting: Urology

## 2023-06-30 VITALS — BP 131/68 | HR 99 | Ht 70.0 in | Wt 198.0 lb

## 2023-06-30 DIAGNOSIS — R972 Elevated prostate specific antigen [PSA]: Secondary | ICD-10-CM

## 2023-06-30 DIAGNOSIS — Z2989 Encounter for other specified prophylactic measures: Secondary | ICD-10-CM | POA: Diagnosis not present

## 2023-06-30 DIAGNOSIS — C61 Malignant neoplasm of prostate: Secondary | ICD-10-CM

## 2023-06-30 MED ORDER — LEUPROLIDE ACETATE (6 MONTH) 45 MG ~~LOC~~ KIT
45.0000 mg | PACK | Freq: Once | SUBCUTANEOUS | Status: AC
  Administered 2023-06-30: 45 mg via SUBCUTANEOUS

## 2023-06-30 MED ORDER — CEFTRIAXONE SODIUM 1 G IJ SOLR
1.0000 g | Freq: Once | INTRAMUSCULAR | Status: AC
  Administered 2023-06-30: 1 g via INTRAMUSCULAR

## 2023-06-30 NOTE — Progress Notes (Signed)
   06/30/23  CC: gold fiducial marker placement  HPI: 72 y.o. male with prostate cancer who presents today for placement of fiducial markers in anticipation of his upcoming IMRT with Dr. Rushie Chestnut.  Prostate Gold Fiducial Marker Placement Procedure   Informed consent was obtained after discussing risks/benefits of the procedure.  A time out was performed to ensure correct patient identity.  Pre-Procedure: -Ceftriaxone 1 g IM   Procedure: - Rectal ultrasound probe was placed without difficulty and the prostate visualized - Prostatic block performed with 10 mL 1% Xylocaine - 3 fiducial gold seed markers placed, one at right base, one at left base, one at apex of prostate gland under transrectal ultrasound guidance  Post-Procedure: - Patient tolerated the procedure well - He was counseled to seek immediate medical attention if experiences any severe pain, significant bleeding, or fevers - He received 22-month depot leuprolide today.  We discussed the rationale of ADT and the treatment of prostate cancer and the most common side effects of hot flashes, tiredness and fatigue were discussed    Irineo Axon, MD

## 2023-06-30 NOTE — Progress Notes (Signed)
Eligard SubQ Injection   Due to Prostate Cancer patient is present today for a Eligard Injection.  Medication: Eligard 6 month Dose: 45 mg  Location: right  Lot: [id number] Exp: 07/19/2024  Patient tolerated well, no complications were noted  Performed by: Ples Specter CMA  Per Dr. Lonna Cobb patient is to continue therapy onces  This appointment was scheduled using wheel and given to patient today along with reminder continue on Vitamin D 800-1000iu and Calcium 1000-1200mg  daily while on Androgen Deprivation Therapy.  PA approval dates:

## 2023-07-06 ENCOUNTER — Other Ambulatory Visit: Payer: Self-pay | Admitting: Radiation Oncology

## 2023-07-06 DIAGNOSIS — C801 Malignant (primary) neoplasm, unspecified: Secondary | ICD-10-CM

## 2023-07-07 ENCOUNTER — Ambulatory Visit: Payer: Medicare Other

## 2023-07-19 ENCOUNTER — Other Ambulatory Visit: Payer: Self-pay | Admitting: *Deleted

## 2023-07-19 MED ORDER — TAMSULOSIN HCL 0.4 MG PO CAPS: 0.4000 mg | ORAL_CAPSULE | Freq: Every day | ORAL | 4 refills | Status: DC

## 2023-07-25 DIAGNOSIS — Z191 Hormone sensitive malignancy status: Secondary | ICD-10-CM | POA: Diagnosis not present

## 2023-07-25 DIAGNOSIS — C61 Malignant neoplasm of prostate: Secondary | ICD-10-CM | POA: Diagnosis not present

## 2023-07-28 ENCOUNTER — Ambulatory Visit
Admission: RE | Admit: 2023-07-28 | Discharge: 2023-07-28 | Disposition: A | Discharge status: Home / Self Care | Payer: Medicare Other | Source: Ambulatory Visit | Attending: Radiation Oncology | Admitting: Radiation Oncology

## 2023-07-28 DIAGNOSIS — Z510 Encounter for antineoplastic radiation therapy: Secondary | ICD-10-CM | POA: Diagnosis not present

## 2023-07-28 DIAGNOSIS — Z191 Hormone sensitive malignancy status: Secondary | ICD-10-CM | POA: Diagnosis not present

## 2023-07-28 DIAGNOSIS — C801 Malignant (primary) neoplasm, unspecified: Secondary | ICD-10-CM

## 2023-07-28 DIAGNOSIS — C61 Malignant neoplasm of prostate: Secondary | ICD-10-CM | POA: Insufficient documentation

## 2023-07-29 ENCOUNTER — Other Ambulatory Visit: Payer: Self-pay | Admitting: *Deleted

## 2023-07-29 DIAGNOSIS — C61 Malignant neoplasm of prostate: Secondary | ICD-10-CM

## 2023-08-01 DIAGNOSIS — C61 Malignant neoplasm of prostate: Secondary | ICD-10-CM | POA: Diagnosis not present

## 2023-08-01 DIAGNOSIS — Z191 Hormone sensitive malignancy status: Secondary | ICD-10-CM | POA: Diagnosis not present

## 2023-08-01 DIAGNOSIS — Z510 Encounter for antineoplastic radiation therapy: Secondary | ICD-10-CM | POA: Diagnosis not present

## 2023-08-04 ENCOUNTER — Ambulatory Visit: Admission: RE | Admit: 2023-08-04 | Payer: Medicare Other | Source: Ambulatory Visit

## 2023-08-08 ENCOUNTER — Ambulatory Visit
Admission: RE | Admit: 2023-08-08 | Discharge: 2023-08-08 | Disposition: A | Discharge status: Home / Self Care | Payer: Medicare Other | Source: Ambulatory Visit | Attending: Radiation Oncology | Admitting: Radiation Oncology

## 2023-08-08 ENCOUNTER — Other Ambulatory Visit: Payer: Self-pay

## 2023-08-08 DIAGNOSIS — Z191 Hormone sensitive malignancy status: Secondary | ICD-10-CM | POA: Diagnosis not present

## 2023-08-08 DIAGNOSIS — Z510 Encounter for antineoplastic radiation therapy: Secondary | ICD-10-CM | POA: Diagnosis not present

## 2023-08-08 DIAGNOSIS — C61 Malignant neoplasm of prostate: Secondary | ICD-10-CM | POA: Diagnosis not present

## 2023-08-08 LAB — RAD ONC ARIA SESSION SUMMARY
Course Elapsed Days: 0
Plan Fractions Treated to Date: 1
Plan Prescribed Dose Per Fraction: 2.5 Gy
Plan Total Fractions Prescribed: 28
Plan Total Prescribed Dose: 70 Gy
Reference Point Dosage Given to Date: 2.5 Gy
Reference Point Session Dosage Given: 2.5 Gy
Session Number: 1

## 2023-08-09 ENCOUNTER — Ambulatory Visit
Admission: RE | Admit: 2023-08-09 | Discharge: 2023-08-09 | Disposition: A | Discharge status: Home / Self Care | Payer: Medicare Other | Source: Ambulatory Visit | Attending: Radiation Oncology | Admitting: Radiation Oncology

## 2023-08-09 ENCOUNTER — Other Ambulatory Visit: Payer: Self-pay

## 2023-08-09 DIAGNOSIS — Z510 Encounter for antineoplastic radiation therapy: Secondary | ICD-10-CM | POA: Diagnosis not present

## 2023-08-09 DIAGNOSIS — C61 Malignant neoplasm of prostate: Secondary | ICD-10-CM | POA: Diagnosis not present

## 2023-08-09 DIAGNOSIS — Z191 Hormone sensitive malignancy status: Secondary | ICD-10-CM | POA: Diagnosis not present

## 2023-08-09 LAB — RAD ONC ARIA SESSION SUMMARY
Course Elapsed Days: 1
Plan Fractions Treated to Date: 2
Plan Prescribed Dose Per Fraction: 2.5 Gy
Plan Total Fractions Prescribed: 28
Plan Total Prescribed Dose: 70 Gy
Reference Point Dosage Given to Date: 5 Gy
Reference Point Session Dosage Given: 2.5 Gy
Session Number: 2

## 2023-08-10 ENCOUNTER — Other Ambulatory Visit: Payer: Self-pay

## 2023-08-10 ENCOUNTER — Ambulatory Visit
Admission: RE | Admit: 2023-08-10 | Discharge: 2023-08-10 | Disposition: A | Discharge status: Home / Self Care | Payer: Medicare Other | Source: Ambulatory Visit | Attending: Radiation Oncology | Admitting: Radiation Oncology

## 2023-08-10 DIAGNOSIS — Z191 Hormone sensitive malignancy status: Secondary | ICD-10-CM | POA: Diagnosis not present

## 2023-08-10 DIAGNOSIS — C61 Malignant neoplasm of prostate: Secondary | ICD-10-CM | POA: Diagnosis not present

## 2023-08-10 DIAGNOSIS — Z510 Encounter for antineoplastic radiation therapy: Secondary | ICD-10-CM | POA: Diagnosis not present

## 2023-08-10 LAB — RAD ONC ARIA SESSION SUMMARY
Course Elapsed Days: 2
Plan Fractions Treated to Date: 3
Plan Prescribed Dose Per Fraction: 2.5 Gy
Plan Total Fractions Prescribed: 28
Plan Total Prescribed Dose: 70 Gy
Reference Point Dosage Given to Date: 7.5 Gy
Reference Point Session Dosage Given: 2.5 Gy
Session Number: 3

## 2023-08-11 ENCOUNTER — Inpatient Hospital Stay: Payer: Medicare Other | Attending: Radiation Oncology

## 2023-08-11 ENCOUNTER — Telehealth: Payer: Self-pay | Admitting: *Deleted

## 2023-08-11 ENCOUNTER — Other Ambulatory Visit: Payer: Self-pay

## 2023-08-11 ENCOUNTER — Ambulatory Visit
Admission: RE | Admit: 2023-08-11 | Discharge: 2023-08-11 | Disposition: A | Discharge status: Home / Self Care | Payer: Medicare Other | Source: Ambulatory Visit | Attending: Radiation Oncology | Admitting: Radiation Oncology

## 2023-08-11 DIAGNOSIS — Z191 Hormone sensitive malignancy status: Secondary | ICD-10-CM | POA: Diagnosis not present

## 2023-08-11 DIAGNOSIS — C61 Malignant neoplasm of prostate: Secondary | ICD-10-CM | POA: Diagnosis not present

## 2023-08-11 DIAGNOSIS — Z510 Encounter for antineoplastic radiation therapy: Secondary | ICD-10-CM | POA: Diagnosis not present

## 2023-08-11 LAB — RAD ONC ARIA SESSION SUMMARY
Course Elapsed Days: 3
Plan Fractions Treated to Date: 4
Plan Prescribed Dose Per Fraction: 2.5 Gy
Plan Total Fractions Prescribed: 28
Plan Total Prescribed Dose: 70 Gy
Reference Point Dosage Given to Date: 10 Gy
Reference Point Session Dosage Given: 2.5 Gy
Session Number: 4

## 2023-08-11 LAB — CBC (CANCER CENTER ONLY)
HCT: 32.6 % — ABNORMAL LOW (ref 39.0–52.0)
Hemoglobin: 11.6 g/dL — ABNORMAL LOW (ref 13.0–17.0)
MCH: 34 pg (ref 26.0–34.0)
MCHC: 35.6 g/dL (ref 30.0–36.0)
MCV: 95.6 fL (ref 80.0–100.0)
Platelet Count: 146 10*3/uL — ABNORMAL LOW (ref 150–400)
RBC: 3.41 MIL/uL — ABNORMAL LOW (ref 4.22–5.81)
RDW: 13.2 % (ref 11.5–15.5)
WBC Count: 5.7 10*3/uL (ref 4.0–10.5)
nRBC: 0 % (ref 0.0–0.2)

## 2023-08-11 NOTE — Telephone Encounter (Signed)
Patient's wife called saying that she needed to talk to Dr. Crystals staff so that they can find out what Dr. Claiborne Billings they used for urology because he thinks he has a UTI.  I looked it up and is Dr. Lonna Cobb.  The wife said to use the home telephone number.  I called [phone] and got the voicemail and left message that he can go to see Dr. Lonna Cobb

## 2023-08-12 ENCOUNTER — Other Ambulatory Visit: Payer: Self-pay

## 2023-08-12 ENCOUNTER — Ambulatory Visit
Admission: RE | Admit: 2023-08-12 | Discharge: 2023-08-12 | Disposition: A | Discharge status: Home / Self Care | Payer: Medicare Other | Source: Ambulatory Visit | Attending: Radiation Oncology | Admitting: Radiation Oncology

## 2023-08-12 DIAGNOSIS — Z510 Encounter for antineoplastic radiation therapy: Secondary | ICD-10-CM | POA: Diagnosis not present

## 2023-08-12 DIAGNOSIS — Z191 Hormone sensitive malignancy status: Secondary | ICD-10-CM | POA: Diagnosis not present

## 2023-08-12 DIAGNOSIS — C61 Malignant neoplasm of prostate: Secondary | ICD-10-CM | POA: Diagnosis not present

## 2023-08-12 LAB — RAD ONC ARIA SESSION SUMMARY
Course Elapsed Days: 4
Plan Fractions Treated to Date: 5
Plan Prescribed Dose Per Fraction: 2.5 Gy
Plan Total Fractions Prescribed: 28
Plan Total Prescribed Dose: 70 Gy
Reference Point Dosage Given to Date: 12.5 Gy
Reference Point Session Dosage Given: 2.5 Gy
Session Number: 5

## 2023-08-15 ENCOUNTER — Ambulatory Visit
Admission: RE | Admit: 2023-08-15 | Discharge: 2023-08-15 | Disposition: A | Discharge status: Home / Self Care | Payer: Medicare Other | Source: Ambulatory Visit | Attending: Radiation Oncology | Admitting: Radiation Oncology

## 2023-08-15 ENCOUNTER — Other Ambulatory Visit: Payer: Self-pay

## 2023-08-15 DIAGNOSIS — Z191 Hormone sensitive malignancy status: Secondary | ICD-10-CM | POA: Diagnosis not present

## 2023-08-15 DIAGNOSIS — C61 Malignant neoplasm of prostate: Secondary | ICD-10-CM | POA: Diagnosis not present

## 2023-08-15 DIAGNOSIS — Z510 Encounter for antineoplastic radiation therapy: Secondary | ICD-10-CM | POA: Diagnosis not present

## 2023-08-15 LAB — RAD ONC ARIA SESSION SUMMARY
Course Elapsed Days: 7
Plan Fractions Treated to Date: 6
Plan Prescribed Dose Per Fraction: 2.5 Gy
Plan Total Fractions Prescribed: 28
Plan Total Prescribed Dose: 70 Gy
Reference Point Dosage Given to Date: 15 Gy
Reference Point Session Dosage Given: 2.5 Gy
Session Number: 6

## 2023-08-16 ENCOUNTER — Other Ambulatory Visit: Payer: Self-pay

## 2023-08-16 ENCOUNTER — Ambulatory Visit
Admission: RE | Admit: 2023-08-16 | Discharge: 2023-08-16 | Disposition: A | Discharge status: Home / Self Care | Payer: Medicare Other | Source: Ambulatory Visit | Attending: Radiation Oncology | Admitting: Radiation Oncology

## 2023-08-16 DIAGNOSIS — Z510 Encounter for antineoplastic radiation therapy: Secondary | ICD-10-CM | POA: Diagnosis not present

## 2023-08-16 DIAGNOSIS — C61 Malignant neoplasm of prostate: Secondary | ICD-10-CM | POA: Diagnosis not present

## 2023-08-16 DIAGNOSIS — Z191 Hormone sensitive malignancy status: Secondary | ICD-10-CM | POA: Diagnosis not present

## 2023-08-16 LAB — RAD ONC ARIA SESSION SUMMARY
Course Elapsed Days: 8
Plan Fractions Treated to Date: 7
Plan Prescribed Dose Per Fraction: 2.5 Gy
Plan Total Fractions Prescribed: 28
Plan Total Prescribed Dose: 70 Gy
Reference Point Dosage Given to Date: 17.5 Gy
Reference Point Session Dosage Given: 2.5 Gy
Session Number: 7

## 2023-08-17 ENCOUNTER — Ambulatory Visit: Payer: Medicare Other

## 2023-08-18 ENCOUNTER — Encounter (INDEPENDENT_AMBULATORY_CARE_PROVIDER_SITE_OTHER): Payer: Self-pay

## 2023-08-18 ENCOUNTER — Ambulatory Visit
Admission: RE | Admit: 2023-08-18 | Discharge: 2023-08-18 | Disposition: A | Discharge status: Home / Self Care | Payer: Medicare Other | Source: Ambulatory Visit | Attending: Radiation Oncology | Admitting: Radiation Oncology

## 2023-08-18 ENCOUNTER — Other Ambulatory Visit: Payer: Self-pay | Admitting: *Deleted

## 2023-08-18 ENCOUNTER — Other Ambulatory Visit: Payer: Self-pay

## 2023-08-18 DIAGNOSIS — C61 Malignant neoplasm of prostate: Secondary | ICD-10-CM

## 2023-08-18 DIAGNOSIS — Z191 Hormone sensitive malignancy status: Secondary | ICD-10-CM | POA: Diagnosis not present

## 2023-08-18 DIAGNOSIS — Z510 Encounter for antineoplastic radiation therapy: Secondary | ICD-10-CM | POA: Diagnosis not present

## 2023-08-18 LAB — RAD ONC ARIA SESSION SUMMARY
Course Elapsed Days: 10
Plan Fractions Treated to Date: 8
Plan Prescribed Dose Per Fraction: 2.5 Gy
Plan Total Fractions Prescribed: 28
Plan Total Prescribed Dose: 70 Gy
Reference Point Dosage Given to Date: 20 Gy
Reference Point Session Dosage Given: 2.5 Gy
Session Number: 8

## 2023-08-19 ENCOUNTER — Other Ambulatory Visit: Payer: Self-pay

## 2023-08-19 ENCOUNTER — Ambulatory Visit
Admission: RE | Admit: 2023-08-19 | Discharge: 2023-08-19 | Disposition: A | Discharge status: Home / Self Care | Payer: Medicare Other | Source: Ambulatory Visit | Attending: Radiation Oncology | Admitting: Radiation Oncology

## 2023-08-19 DIAGNOSIS — Z510 Encounter for antineoplastic radiation therapy: Secondary | ICD-10-CM | POA: Diagnosis not present

## 2023-08-19 DIAGNOSIS — Z191 Hormone sensitive malignancy status: Secondary | ICD-10-CM | POA: Diagnosis not present

## 2023-08-19 DIAGNOSIS — C61 Malignant neoplasm of prostate: Secondary | ICD-10-CM | POA: Diagnosis not present

## 2023-08-19 LAB — RAD ONC ARIA SESSION SUMMARY
Course Elapsed Days: 11
Plan Fractions Treated to Date: 9
Plan Prescribed Dose Per Fraction: 2.5 Gy
Plan Total Fractions Prescribed: 28
Plan Total Prescribed Dose: 70 Gy
Reference Point Dosage Given to Date: 22.5 Gy
Reference Point Session Dosage Given: 2.5 Gy
Session Number: 9

## 2023-08-22 ENCOUNTER — Other Ambulatory Visit: Payer: Self-pay

## 2023-08-22 ENCOUNTER — Ambulatory Visit
Admission: RE | Admit: 2023-08-22 | Discharge: 2023-08-22 | Disposition: A | Discharge status: Home / Self Care | Payer: Medicare Other | Source: Ambulatory Visit | Attending: Radiation Oncology | Admitting: Radiation Oncology

## 2023-08-22 DIAGNOSIS — Z510 Encounter for antineoplastic radiation therapy: Secondary | ICD-10-CM | POA: Diagnosis not present

## 2023-08-22 DIAGNOSIS — C61 Malignant neoplasm of prostate: Secondary | ICD-10-CM | POA: Diagnosis not present

## 2023-08-22 DIAGNOSIS — Z191 Hormone sensitive malignancy status: Secondary | ICD-10-CM | POA: Diagnosis not present

## 2023-08-22 LAB — RAD ONC ARIA SESSION SUMMARY
Course Elapsed Days: 14
Plan Fractions Treated to Date: 10
Plan Prescribed Dose Per Fraction: 2.5 Gy
Plan Total Fractions Prescribed: 28
Plan Total Prescribed Dose: 70 Gy
Reference Point Dosage Given to Date: 25 Gy
Reference Point Session Dosage Given: 2.5 Gy
Session Number: 10

## 2023-08-23 ENCOUNTER — Ambulatory Visit
Admission: RE | Admit: 2023-08-23 | Discharge: 2023-08-23 | Disposition: A | Discharge status: Home / Self Care | Payer: Medicare Other | Source: Ambulatory Visit | Attending: Radiation Oncology | Admitting: Radiation Oncology

## 2023-08-23 ENCOUNTER — Other Ambulatory Visit: Payer: Self-pay

## 2023-08-23 DIAGNOSIS — Z510 Encounter for antineoplastic radiation therapy: Secondary | ICD-10-CM | POA: Diagnosis not present

## 2023-08-23 DIAGNOSIS — Z191 Hormone sensitive malignancy status: Secondary | ICD-10-CM | POA: Diagnosis not present

## 2023-08-23 DIAGNOSIS — C61 Malignant neoplasm of prostate: Secondary | ICD-10-CM | POA: Diagnosis not present

## 2023-08-23 LAB — RAD ONC ARIA SESSION SUMMARY
Course Elapsed Days: 15
Plan Fractions Treated to Date: 11
Plan Prescribed Dose Per Fraction: 2.5 Gy
Plan Total Fractions Prescribed: 28
Plan Total Prescribed Dose: 70 Gy
Reference Point Dosage Given to Date: 27.5 Gy
Reference Point Session Dosage Given: 2.5 Gy
Session Number: 11

## 2023-08-24 ENCOUNTER — Other Ambulatory Visit: Payer: Self-pay

## 2023-08-24 ENCOUNTER — Ambulatory Visit
Admission: RE | Admit: 2023-08-24 | Discharge: 2023-08-24 | Disposition: A | Discharge status: Home / Self Care | Payer: Medicare Other | Source: Ambulatory Visit | Attending: Radiation Oncology | Admitting: Radiation Oncology

## 2023-08-24 DIAGNOSIS — C61 Malignant neoplasm of prostate: Secondary | ICD-10-CM | POA: Diagnosis not present

## 2023-08-24 DIAGNOSIS — Z191 Hormone sensitive malignancy status: Secondary | ICD-10-CM | POA: Diagnosis not present

## 2023-08-24 DIAGNOSIS — Z510 Encounter for antineoplastic radiation therapy: Secondary | ICD-10-CM | POA: Diagnosis not present

## 2023-08-24 LAB — RAD ONC ARIA SESSION SUMMARY
Course Elapsed Days: 16
Plan Fractions Treated to Date: 12
Plan Prescribed Dose Per Fraction: 2.5 Gy
Plan Total Fractions Prescribed: 28
Plan Total Prescribed Dose: 70 Gy
Reference Point Dosage Given to Date: 30 Gy
Reference Point Session Dosage Given: 2.5 Gy
Session Number: 12

## 2023-08-25 ENCOUNTER — Inpatient Hospital Stay: Payer: Medicare Other

## 2023-08-25 ENCOUNTER — Ambulatory Visit
Admission: RE | Admit: 2023-08-25 | Discharge: 2023-08-25 | Disposition: A | Discharge status: Home / Self Care | Payer: Medicare Other | Source: Ambulatory Visit | Attending: Radiation Oncology | Admitting: Radiation Oncology

## 2023-08-25 ENCOUNTER — Other Ambulatory Visit: Payer: Self-pay

## 2023-08-25 DIAGNOSIS — C61 Malignant neoplasm of prostate: Secondary | ICD-10-CM

## 2023-08-25 DIAGNOSIS — Z191 Hormone sensitive malignancy status: Secondary | ICD-10-CM | POA: Diagnosis not present

## 2023-08-25 DIAGNOSIS — Z510 Encounter for antineoplastic radiation therapy: Secondary | ICD-10-CM | POA: Diagnosis not present

## 2023-08-25 LAB — RAD ONC ARIA SESSION SUMMARY
Course Elapsed Days: 17
Plan Fractions Treated to Date: 13
Plan Prescribed Dose Per Fraction: 2.5 Gy
Plan Total Fractions Prescribed: 28
Plan Total Prescribed Dose: 70 Gy
Reference Point Dosage Given to Date: 32.5 Gy
Reference Point Session Dosage Given: 2.5 Gy
Session Number: 13

## 2023-08-25 LAB — CBC (CANCER CENTER ONLY)
HCT: 32.6 % — ABNORMAL LOW (ref 39.0–52.0)
Hemoglobin: 11.9 g/dL — ABNORMAL LOW (ref 13.0–17.0)
MCH: 33.7 pg (ref 26.0–34.0)
MCHC: 36.5 g/dL — ABNORMAL HIGH (ref 30.0–36.0)
MCV: 92.4 fL (ref 80.0–100.0)
Platelet Count: 142 10*3/uL — ABNORMAL LOW (ref 150–400)
RBC: 3.53 MIL/uL — ABNORMAL LOW (ref 4.22–5.81)
RDW: 13 % (ref 11.5–15.5)
WBC Count: 8.5 10*3/uL (ref 4.0–10.5)
nRBC: 0 % (ref 0.0–0.2)

## 2023-08-26 ENCOUNTER — Other Ambulatory Visit: Payer: Self-pay

## 2023-08-26 ENCOUNTER — Ambulatory Visit
Admission: RE | Admit: 2023-08-26 | Discharge: 2023-08-26 | Disposition: A | Discharge status: Home / Self Care | Payer: Medicare Other | Source: Ambulatory Visit | Attending: Radiation Oncology | Admitting: Radiation Oncology

## 2023-08-26 DIAGNOSIS — C61 Malignant neoplasm of prostate: Secondary | ICD-10-CM | POA: Diagnosis not present

## 2023-08-26 DIAGNOSIS — Z510 Encounter for antineoplastic radiation therapy: Secondary | ICD-10-CM | POA: Diagnosis not present

## 2023-08-26 DIAGNOSIS — Z191 Hormone sensitive malignancy status: Secondary | ICD-10-CM | POA: Diagnosis not present

## 2023-08-26 LAB — RAD ONC ARIA SESSION SUMMARY
Course Elapsed Days: 18
Plan Fractions Treated to Date: 14
Plan Prescribed Dose Per Fraction: 2.5 Gy
Plan Total Fractions Prescribed: 28
Plan Total Prescribed Dose: 70 Gy
Reference Point Dosage Given to Date: 35 Gy
Reference Point Session Dosage Given: 2.5 Gy
Session Number: 14

## 2023-08-29 ENCOUNTER — Other Ambulatory Visit: Payer: Self-pay

## 2023-08-29 ENCOUNTER — Ambulatory Visit
Admission: RE | Admit: 2023-08-29 | Discharge: 2023-08-29 | Disposition: A | Discharge status: Home / Self Care | Payer: Medicare Other | Source: Ambulatory Visit | Attending: Radiation Oncology | Admitting: Radiation Oncology

## 2023-08-29 DIAGNOSIS — E876 Hypokalemia: Secondary | ICD-10-CM | POA: Diagnosis not present

## 2023-08-29 DIAGNOSIS — D631 Anemia in chronic kidney disease: Secondary | ICD-10-CM | POA: Diagnosis not present

## 2023-08-29 DIAGNOSIS — I10 Essential (primary) hypertension: Secondary | ICD-10-CM | POA: Diagnosis not present

## 2023-08-29 DIAGNOSIS — Z191 Hormone sensitive malignancy status: Secondary | ICD-10-CM | POA: Diagnosis not present

## 2023-08-29 DIAGNOSIS — N185 Chronic kidney disease, stage 5: Secondary | ICD-10-CM | POA: Diagnosis not present

## 2023-08-29 DIAGNOSIS — C61 Malignant neoplasm of prostate: Secondary | ICD-10-CM | POA: Diagnosis not present

## 2023-08-29 DIAGNOSIS — Z510 Encounter for antineoplastic radiation therapy: Secondary | ICD-10-CM | POA: Diagnosis not present

## 2023-08-29 LAB — RAD ONC ARIA SESSION SUMMARY
Course Elapsed Days: 21
Plan Fractions Treated to Date: 15
Plan Prescribed Dose Per Fraction: 2.5 Gy
Plan Total Fractions Prescribed: 28
Plan Total Prescribed Dose: 70 Gy
Reference Point Dosage Given to Date: 37.5 Gy
Reference Point Session Dosage Given: 2.5 Gy
Session Number: 15

## 2023-08-30 ENCOUNTER — Other Ambulatory Visit: Payer: Self-pay

## 2023-08-30 ENCOUNTER — Ambulatory Visit
Admission: RE | Admit: 2023-08-30 | Discharge: 2023-08-30 | Disposition: A | Discharge status: Home / Self Care | Payer: Medicare Other | Source: Ambulatory Visit | Attending: Radiation Oncology | Admitting: Radiation Oncology

## 2023-08-30 DIAGNOSIS — Z510 Encounter for antineoplastic radiation therapy: Secondary | ICD-10-CM | POA: Diagnosis not present

## 2023-08-30 DIAGNOSIS — C61 Malignant neoplasm of prostate: Secondary | ICD-10-CM | POA: Diagnosis not present

## 2023-08-30 DIAGNOSIS — Z191 Hormone sensitive malignancy status: Secondary | ICD-10-CM | POA: Diagnosis not present

## 2023-08-30 LAB — RAD ONC ARIA SESSION SUMMARY
Course Elapsed Days: 22
Plan Fractions Treated to Date: 16
Plan Prescribed Dose Per Fraction: 2.5 Gy
Plan Total Fractions Prescribed: 28
Plan Total Prescribed Dose: 70 Gy
Reference Point Dosage Given to Date: 40 Gy
Reference Point Session Dosage Given: 2.5 Gy
Session Number: 16

## 2023-08-31 ENCOUNTER — Other Ambulatory Visit: Payer: Self-pay

## 2023-08-31 ENCOUNTER — Ambulatory Visit
Admission: RE | Admit: 2023-08-31 | Discharge: 2023-08-31 | Disposition: A | Discharge status: Home / Self Care | Payer: Medicare Other | Source: Ambulatory Visit | Attending: Radiation Oncology | Admitting: Radiation Oncology

## 2023-08-31 DIAGNOSIS — Z510 Encounter for antineoplastic radiation therapy: Secondary | ICD-10-CM | POA: Diagnosis not present

## 2023-08-31 DIAGNOSIS — Z191 Hormone sensitive malignancy status: Secondary | ICD-10-CM | POA: Diagnosis not present

## 2023-08-31 DIAGNOSIS — C61 Malignant neoplasm of prostate: Secondary | ICD-10-CM | POA: Diagnosis not present

## 2023-08-31 LAB — RAD ONC ARIA SESSION SUMMARY
Course Elapsed Days: 23
Plan Fractions Treated to Date: 17
Plan Prescribed Dose Per Fraction: 2.5 Gy
Plan Total Fractions Prescribed: 28
Plan Total Prescribed Dose: 70 Gy
Reference Point Dosage Given to Date: 42.5 Gy
Reference Point Session Dosage Given: 2.5 Gy
Session Number: 17

## 2023-09-01 ENCOUNTER — Ambulatory Visit
Admission: RE | Admit: 2023-09-01 | Discharge: 2023-09-01 | Disposition: A | Discharge status: Home / Self Care | Payer: Medicare Other | Source: Ambulatory Visit | Attending: Radiation Oncology | Admitting: Radiation Oncology

## 2023-09-01 ENCOUNTER — Other Ambulatory Visit: Payer: Self-pay

## 2023-09-01 DIAGNOSIS — Z510 Encounter for antineoplastic radiation therapy: Secondary | ICD-10-CM | POA: Diagnosis not present

## 2023-09-01 DIAGNOSIS — C61 Malignant neoplasm of prostate: Secondary | ICD-10-CM | POA: Diagnosis not present

## 2023-09-01 DIAGNOSIS — Z191 Hormone sensitive malignancy status: Secondary | ICD-10-CM | POA: Diagnosis not present

## 2023-09-01 LAB — RAD ONC ARIA SESSION SUMMARY
Course Elapsed Days: 24
Plan Fractions Treated to Date: 18
Plan Prescribed Dose Per Fraction: 2.5 Gy
Plan Total Fractions Prescribed: 28
Plan Total Prescribed Dose: 70 Gy
Reference Point Dosage Given to Date: 45 Gy
Reference Point Session Dosage Given: 2.5 Gy
Session Number: 18

## 2023-09-02 ENCOUNTER — Other Ambulatory Visit: Payer: Self-pay

## 2023-09-02 ENCOUNTER — Ambulatory Visit
Admission: RE | Admit: 2023-09-02 | Discharge: 2023-09-02 | Disposition: A | Discharge status: Home / Self Care | Payer: Medicare Other | Source: Ambulatory Visit | Attending: Radiation Oncology | Admitting: Radiation Oncology

## 2023-09-02 DIAGNOSIS — C61 Malignant neoplasm of prostate: Secondary | ICD-10-CM | POA: Diagnosis not present

## 2023-09-02 DIAGNOSIS — Z191 Hormone sensitive malignancy status: Secondary | ICD-10-CM | POA: Diagnosis not present

## 2023-09-02 DIAGNOSIS — Z510 Encounter for antineoplastic radiation therapy: Secondary | ICD-10-CM | POA: Diagnosis not present

## 2023-09-02 LAB — RAD ONC ARIA SESSION SUMMARY
Course Elapsed Days: 25
Plan Fractions Treated to Date: 19
Plan Prescribed Dose Per Fraction: 2.5 Gy
Plan Total Fractions Prescribed: 28
Plan Total Prescribed Dose: 70 Gy
Reference Point Dosage Given to Date: 47.5 Gy
Reference Point Session Dosage Given: 2.5 Gy
Session Number: 19

## 2023-09-05 ENCOUNTER — Ambulatory Visit
Admission: RE | Admit: 2023-09-05 | Discharge: 2023-09-05 | Disposition: A | Discharge status: Home / Self Care | Payer: Medicare Other | Source: Ambulatory Visit | Attending: Radiation Oncology | Admitting: Radiation Oncology

## 2023-09-05 ENCOUNTER — Other Ambulatory Visit: Payer: Self-pay

## 2023-09-05 DIAGNOSIS — Z191 Hormone sensitive malignancy status: Secondary | ICD-10-CM | POA: Diagnosis not present

## 2023-09-05 DIAGNOSIS — Z510 Encounter for antineoplastic radiation therapy: Secondary | ICD-10-CM | POA: Diagnosis not present

## 2023-09-05 DIAGNOSIS — C61 Malignant neoplasm of prostate: Secondary | ICD-10-CM | POA: Diagnosis not present

## 2023-09-05 LAB — RAD ONC ARIA SESSION SUMMARY
Course Elapsed Days: 28
Plan Fractions Treated to Date: 20
Plan Prescribed Dose Per Fraction: 2.5 Gy
Plan Total Fractions Prescribed: 28
Plan Total Prescribed Dose: 70 Gy
Reference Point Dosage Given to Date: 50 Gy
Reference Point Session Dosage Given: 2.5 Gy
Session Number: 20

## 2023-09-06 ENCOUNTER — Other Ambulatory Visit: Payer: Self-pay

## 2023-09-06 ENCOUNTER — Ambulatory Visit
Admission: RE | Admit: 2023-09-06 | Discharge: 2023-09-06 | Disposition: A | Discharge status: Home / Self Care | Payer: Medicare Other | Source: Ambulatory Visit | Attending: Radiation Oncology | Admitting: Radiation Oncology

## 2023-09-06 DIAGNOSIS — C61 Malignant neoplasm of prostate: Secondary | ICD-10-CM | POA: Diagnosis not present

## 2023-09-06 DIAGNOSIS — Z510 Encounter for antineoplastic radiation therapy: Secondary | ICD-10-CM | POA: Diagnosis not present

## 2023-09-06 DIAGNOSIS — Z191 Hormone sensitive malignancy status: Secondary | ICD-10-CM | POA: Diagnosis not present

## 2023-09-06 LAB — RAD ONC ARIA SESSION SUMMARY
Course Elapsed Days: 29
Plan Fractions Treated to Date: 21
Plan Prescribed Dose Per Fraction: 2.5 Gy
Plan Total Fractions Prescribed: 28
Plan Total Prescribed Dose: 70 Gy
Reference Point Dosage Given to Date: 52.5 Gy
Reference Point Session Dosage Given: 2.5 Gy
Session Number: 21

## 2023-09-07 ENCOUNTER — Other Ambulatory Visit: Payer: Self-pay

## 2023-09-07 ENCOUNTER — Ambulatory Visit
Admission: RE | Admit: 2023-09-07 | Discharge: 2023-09-07 | Disposition: A | Discharge status: Home / Self Care | Payer: Medicare Other | Source: Ambulatory Visit | Attending: Radiation Oncology | Admitting: Radiation Oncology

## 2023-09-07 DIAGNOSIS — Z510 Encounter for antineoplastic radiation therapy: Secondary | ICD-10-CM | POA: Diagnosis not present

## 2023-09-07 DIAGNOSIS — C61 Malignant neoplasm of prostate: Secondary | ICD-10-CM | POA: Diagnosis not present

## 2023-09-07 DIAGNOSIS — Z191 Hormone sensitive malignancy status: Secondary | ICD-10-CM | POA: Diagnosis not present

## 2023-09-07 LAB — RAD ONC ARIA SESSION SUMMARY
Course Elapsed Days: 30
Plan Fractions Treated to Date: 22
Plan Prescribed Dose Per Fraction: 2.5 Gy
Plan Total Fractions Prescribed: 28
Plan Total Prescribed Dose: 70 Gy
Reference Point Dosage Given to Date: 55 Gy
Reference Point Session Dosage Given: 2.5 Gy
Session Number: 22

## 2023-09-08 ENCOUNTER — Inpatient Hospital Stay: Payer: Medicare Other | Attending: Radiation Oncology

## 2023-09-08 ENCOUNTER — Other Ambulatory Visit: Payer: Self-pay

## 2023-09-08 ENCOUNTER — Ambulatory Visit
Admission: RE | Admit: 2023-09-08 | Discharge: 2023-09-08 | Disposition: A | Discharge status: Home / Self Care | Payer: Medicare Other | Source: Ambulatory Visit | Attending: Radiation Oncology | Admitting: Radiation Oncology

## 2023-09-08 DIAGNOSIS — Z191 Hormone sensitive malignancy status: Secondary | ICD-10-CM | POA: Diagnosis not present

## 2023-09-08 DIAGNOSIS — C61 Malignant neoplasm of prostate: Secondary | ICD-10-CM | POA: Diagnosis not present

## 2023-09-08 DIAGNOSIS — Z510 Encounter for antineoplastic radiation therapy: Secondary | ICD-10-CM | POA: Diagnosis not present

## 2023-09-08 LAB — RAD ONC ARIA SESSION SUMMARY
Course Elapsed Days: 31
Plan Fractions Treated to Date: 23
Plan Prescribed Dose Per Fraction: 2.5 Gy
Plan Total Fractions Prescribed: 28
Plan Total Prescribed Dose: 70 Gy
Reference Point Dosage Given to Date: 57.5 Gy
Reference Point Session Dosage Given: 2.5 Gy
Session Number: 23

## 2023-09-09 ENCOUNTER — Ambulatory Visit
Admission: RE | Admit: 2023-09-09 | Discharge: 2023-09-09 | Disposition: A | Discharge status: Home / Self Care | Payer: Medicare Other | Source: Ambulatory Visit | Attending: Radiation Oncology | Admitting: Radiation Oncology

## 2023-09-09 ENCOUNTER — Other Ambulatory Visit: Payer: Self-pay

## 2023-09-09 DIAGNOSIS — C61 Malignant neoplasm of prostate: Secondary | ICD-10-CM | POA: Diagnosis not present

## 2023-09-09 DIAGNOSIS — Z510 Encounter for antineoplastic radiation therapy: Secondary | ICD-10-CM | POA: Diagnosis not present

## 2023-09-09 DIAGNOSIS — Z191 Hormone sensitive malignancy status: Secondary | ICD-10-CM | POA: Diagnosis not present

## 2023-09-09 LAB — RAD ONC ARIA SESSION SUMMARY
Course Elapsed Days: 32
Plan Fractions Treated to Date: 24
Plan Prescribed Dose Per Fraction: 2.5 Gy
Plan Total Fractions Prescribed: 28
Plan Total Prescribed Dose: 70 Gy
Reference Point Dosage Given to Date: 60 Gy
Reference Point Session Dosage Given: 2.5 Gy
Session Number: 24

## 2023-09-12 ENCOUNTER — Other Ambulatory Visit: Payer: Self-pay

## 2023-09-12 ENCOUNTER — Ambulatory Visit
Admission: RE | Admit: 2023-09-12 | Discharge: 2023-09-12 | Disposition: A | Discharge status: Home / Self Care | Payer: Medicare Other | Source: Ambulatory Visit | Attending: Radiation Oncology | Admitting: Radiation Oncology

## 2023-09-12 DIAGNOSIS — Z191 Hormone sensitive malignancy status: Secondary | ICD-10-CM | POA: Diagnosis not present

## 2023-09-12 DIAGNOSIS — C61 Malignant neoplasm of prostate: Secondary | ICD-10-CM | POA: Diagnosis not present

## 2023-09-12 DIAGNOSIS — Z510 Encounter for antineoplastic radiation therapy: Secondary | ICD-10-CM | POA: Diagnosis not present

## 2023-09-12 LAB — RAD ONC ARIA SESSION SUMMARY
Course Elapsed Days: 35
Plan Fractions Treated to Date: 25
Plan Prescribed Dose Per Fraction: 2.5 Gy
Plan Total Fractions Prescribed: 28
Plan Total Prescribed Dose: 70 Gy
Reference Point Dosage Given to Date: 62.5 Gy
Reference Point Session Dosage Given: 2.5 Gy
Session Number: 25

## 2023-09-13 ENCOUNTER — Other Ambulatory Visit: Payer: Self-pay

## 2023-09-13 ENCOUNTER — Ambulatory Visit
Admission: RE | Admit: 2023-09-13 | Discharge: 2023-09-13 | Disposition: A | Discharge status: Home / Self Care | Payer: Medicare Other | Source: Ambulatory Visit | Attending: Radiation Oncology | Admitting: Radiation Oncology

## 2023-09-13 DIAGNOSIS — Z510 Encounter for antineoplastic radiation therapy: Secondary | ICD-10-CM | POA: Diagnosis not present

## 2023-09-13 DIAGNOSIS — Z191 Hormone sensitive malignancy status: Secondary | ICD-10-CM | POA: Diagnosis not present

## 2023-09-13 DIAGNOSIS — C61 Malignant neoplasm of prostate: Secondary | ICD-10-CM | POA: Diagnosis not present

## 2023-09-13 LAB — RAD ONC ARIA SESSION SUMMARY
Course Elapsed Days: 36
Plan Fractions Treated to Date: 26
Plan Prescribed Dose Per Fraction: 2.5 Gy
Plan Total Fractions Prescribed: 28
Plan Total Prescribed Dose: 70 Gy
Reference Point Dosage Given to Date: 65 Gy
Reference Point Session Dosage Given: 2.5 Gy
Session Number: 26

## 2023-09-14 ENCOUNTER — Other Ambulatory Visit: Payer: Self-pay

## 2023-09-14 ENCOUNTER — Ambulatory Visit
Admission: RE | Admit: 2023-09-14 | Discharge: 2023-09-14 | Disposition: A | Discharge status: Home / Self Care | Payer: Medicare Other | Source: Ambulatory Visit | Attending: Radiation Oncology | Admitting: Radiation Oncology

## 2023-09-14 ENCOUNTER — Ambulatory Visit: Payer: Medicare Other

## 2023-09-14 DIAGNOSIS — Z191 Hormone sensitive malignancy status: Secondary | ICD-10-CM | POA: Diagnosis not present

## 2023-09-14 DIAGNOSIS — Z510 Encounter for antineoplastic radiation therapy: Secondary | ICD-10-CM | POA: Diagnosis not present

## 2023-09-14 DIAGNOSIS — C61 Malignant neoplasm of prostate: Secondary | ICD-10-CM | POA: Diagnosis not present

## 2023-09-14 LAB — RAD ONC ARIA SESSION SUMMARY
Course Elapsed Days: 37
Plan Fractions Treated to Date: 27
Plan Prescribed Dose Per Fraction: 2.5 Gy
Plan Total Fractions Prescribed: 28
Plan Total Prescribed Dose: 70 Gy
Reference Point Dosage Given to Date: 67.5 Gy
Reference Point Session Dosage Given: 2.5 Gy
Session Number: 27

## 2023-09-15 ENCOUNTER — Ambulatory Visit: Payer: Medicare Other

## 2023-09-16 ENCOUNTER — Ambulatory Visit: Payer: Medicare Other

## 2023-09-19 ENCOUNTER — Other Ambulatory Visit: Payer: Self-pay

## 2023-09-19 ENCOUNTER — Ambulatory Visit
Admission: RE | Admit: 2023-09-19 | Discharge: 2023-09-19 | Disposition: A | Discharge status: Home / Self Care | Payer: Medicare Other | Source: Ambulatory Visit | Attending: Radiation Oncology | Admitting: Radiation Oncology

## 2023-09-19 ENCOUNTER — Ambulatory Visit: Payer: Medicare Other

## 2023-09-19 DIAGNOSIS — Z191 Hormone sensitive malignancy status: Secondary | ICD-10-CM | POA: Diagnosis not present

## 2023-09-19 DIAGNOSIS — C61 Malignant neoplasm of prostate: Secondary | ICD-10-CM | POA: Diagnosis not present

## 2023-09-19 DIAGNOSIS — Z510 Encounter for antineoplastic radiation therapy: Secondary | ICD-10-CM | POA: Diagnosis not present

## 2023-09-19 LAB — RAD ONC ARIA SESSION SUMMARY
Course Elapsed Days: 42
Plan Fractions Treated to Date: 28
Plan Prescribed Dose Per Fraction: 2.5 Gy
Plan Total Fractions Prescribed: 28
Plan Total Prescribed Dose: 70 Gy
Reference Point Dosage Given to Date: 70 Gy
Reference Point Session Dosage Given: 2.5 Gy
Session Number: 28

## 2023-09-20 ENCOUNTER — Ambulatory Visit: Payer: Medicare Other

## 2023-09-20 NOTE — Radiation Completion Notes (Signed)
 Patient Name: Austin Goodwin, Austin Goodwin MRN: [id number] Date of Birth: [birth year] Referring Physician: Adrian Prince, M.D. Date of Service: 2023-09-20 Radiation Oncologist: Carmina Miller, M.D. [facility]                             RADIATION ONCOLOGY END OF TREATMENT NOTE     Diagnosis: C61 Malignant neoplasm of prostate Intent: Curative     HPI: Patient is a 73 year old male who has been followed for several years by urology elevated PSA.  MRI scan back in April showing a PI-RADS category 5 lesion majority the right and left peripheral zone.  There was substantial capsular contact the regions of interest potentially slight effacement.  The prostate gland and the rectum raising concern for early Trans capsular spread.  He did eventually undergo UroNav biopsy showing 10 of 12 cores positive for Gleason 7 (3+4).  PSA was 8.  He was seen by Dr. Logan Bores health and recommendation for radiation was made.  Patient does have a history of renal insufficiency with only 14% renal function.  Pelvic MRI also showed no evidence of seminal vesicle involvement bone metastasis or adenopathy.  He is now referred to radiation oncology for consideration of treatment.      ==========DELIVERED PLANS==========  First Treatment Date: 2023-08-08 Last Treatment Date: 2023-09-19   Plan Name: Prostate Site: Prostate Technique: IMRT Mode: Photon Dose Per Fraction: 2.5 Gy Prescribed Dose (Delivered / Prescribed): 70 Gy / 70 Gy Prescribed Fxs (Delivered / Prescribed): 28 / 28     ==========ON TREATMENT VISIT DATES========== 2023-08-09, 2023-08-16, 2023-08-23, 2023-08-30, 2023-09-06, 2023-09-13     ==========UPCOMING VISITS========== 10/24/2023 [facility] RAD ONCOLOGY FOLLOW UP 30 Chrystal, Sherrine Maples, MD        ==========APPENDIX - ON TREATMENT VISIT NOTES==========   See weekly On Treatment Notes in Epic for details in the Media tab (listed as Progress notes on the On Treatment Visit Dates  listed above).

## 2023-09-21 ENCOUNTER — Ambulatory Visit: Payer: Medicare Other

## 2023-09-22 ENCOUNTER — Ambulatory Visit: Payer: Medicare Other

## 2023-09-22 ENCOUNTER — Inpatient Hospital Stay: Payer: Medicare Other

## 2023-09-23 ENCOUNTER — Ambulatory Visit: Payer: Medicare Other

## 2023-09-26 ENCOUNTER — Ambulatory Visit: Payer: Medicare Other

## 2023-09-27 ENCOUNTER — Ambulatory Visit: Payer: Medicare Other

## 2023-09-28 ENCOUNTER — Ambulatory Visit: Payer: Medicare Other

## 2023-09-29 ENCOUNTER — Ambulatory Visit: Payer: Medicare Other

## 2023-09-30 ENCOUNTER — Ambulatory Visit: Payer: Medicare Other

## 2023-10-17 DIAGNOSIS — N185 Chronic kidney disease, stage 5: Secondary | ICD-10-CM | POA: Diagnosis not present

## 2023-10-17 DIAGNOSIS — N119 Chronic tubulo-interstitial nephritis, unspecified: Secondary | ICD-10-CM | POA: Diagnosis not present

## 2023-10-17 DIAGNOSIS — N189 Chronic kidney disease, unspecified: Secondary | ICD-10-CM | POA: Diagnosis not present

## 2023-10-17 DIAGNOSIS — I10 Essential (primary) hypertension: Secondary | ICD-10-CM | POA: Diagnosis not present

## 2023-10-17 DIAGNOSIS — E876 Hypokalemia: Secondary | ICD-10-CM | POA: Diagnosis not present

## 2023-10-17 DIAGNOSIS — E1122 Type 2 diabetes mellitus with diabetic chronic kidney disease: Secondary | ICD-10-CM | POA: Diagnosis not present

## 2023-10-24 ENCOUNTER — Ambulatory Visit
Admission: RE | Admit: 2023-10-24 | Discharge: 2023-10-24 | Disposition: A | Discharge status: Home / Self Care | Payer: Medicare Other | Source: Ambulatory Visit | Attending: Radiation Oncology | Admitting: Radiation Oncology

## 2023-10-24 ENCOUNTER — Encounter: Payer: Self-pay | Admitting: Radiation Oncology

## 2023-10-24 VITALS — BP 154/78 | HR 76 | Temp 97.6°F | Resp 16 | Wt 205.0 lb

## 2023-10-24 DIAGNOSIS — C61 Malignant neoplasm of prostate: Secondary | ICD-10-CM | POA: Insufficient documentation

## 2023-10-24 NOTE — Progress Notes (Signed)
 Radiation Oncology Follow up Note  Name: Austin Goodwin   Date:   10/24/2023 MRN:  [id number] DOB: May 26, 1951    This 73 y.o. male presents to the clinic today for 1 month follow-up status post IMRT radiation therapy for stage IIb (cT2 N0 M0) Gleason 7 (3+4 lows (presenting with a PSA in the 8 range.  REFERRING PROVIDER: Adrian Prince, MD  HPI: Patient is a 73 year old male now out 1 month having completed IMRT radiation therapy to his prostate gland for Gleason 7 adenocarcinoma.  Seen today in routine follow-up he is doing well specifically denies any increased lower urinary tract symptoms diarrhea or fatigue he is still bothered by hot flashes is taking some vitamin D supplements have assured him over time that will dissipate..  COMPLICATIONS OF TREATMENT: none  FOLLOW UP COMPLIANCE: keeps appointments   PHYSICAL EXAM:  BP (!) 154/78   Pulse 76   Temp 97.6 F (36.4 C) (Tympanic)   Resp 16   Wt 205 lb (93 kg)   BMI 29.41 kg/m  Well-developed well-nourished patient in NAD. HEENT reveals PERLA, EOMI, discs not visualized.  Oral cavity is clear. No oral mucosal lesions are identified. Neck is clear without evidence of cervical or supraclavicular adenopathy. Lungs are clear to A&P. Cardiac examination is essentially unremarkable with regular rate and rhythm without murmur rub or thrill. Abdomen is benign with no organomegaly or masses noted. Motor sensory and DTR levels are equal and symmetric in the upper and lower extremities. Cranial nerves II through XII are grossly intact. Proprioception is intact. No peripheral adenopathy or edema is identified. No motor or sensory levels are noted. Crude visual fields are within normal range.  RADIOLOGY RESULTS: No current films for review  PLAN: Present time patient is doing well with extremely low side effect profile from radiation therapy.  I have asked him to return in 3 months with a PSA prior to that visit.  Patient knows to call with any  concerns.  I would like to take this opportunity to thank you for allowing me to participate in the care of your patient.Carmina Miller, MD

## 2023-10-25 ENCOUNTER — Telehealth: Payer: Self-pay | Admitting: *Deleted

## 2023-10-25 NOTE — Telephone Encounter (Signed)
 They gave me [phone] and it is ref, from [facility], wants to get it fixed. I have sent a message to [place] about it

## 2023-11-14 ENCOUNTER — Encounter: Payer: Self-pay | Admitting: Endocrinology

## 2023-11-14 DIAGNOSIS — E1151 Type 2 diabetes mellitus with diabetic peripheral angiopathy without gangrene: Secondary | ICD-10-CM | POA: Diagnosis not present

## 2023-11-15 ENCOUNTER — Other Ambulatory Visit: Payer: Self-pay | Admitting: Endocrinology

## 2023-11-15 DIAGNOSIS — Z881 Allergy status to other antibiotic agents status: Secondary | ICD-10-CM | POA: Diagnosis not present

## 2023-11-15 DIAGNOSIS — Z7951 Long term (current) use of inhaled steroids: Secondary | ICD-10-CM | POA: Diagnosis not present

## 2023-11-15 DIAGNOSIS — R42 Dizziness and giddiness: Secondary | ICD-10-CM | POA: Diagnosis not present

## 2023-11-15 DIAGNOSIS — E876 Hypokalemia: Secondary | ICD-10-CM | POA: Diagnosis not present

## 2023-11-15 DIAGNOSIS — E1122 Type 2 diabetes mellitus with diabetic chronic kidney disease: Secondary | ICD-10-CM | POA: Diagnosis not present

## 2023-11-15 DIAGNOSIS — I5032 Chronic diastolic (congestive) heart failure: Secondary | ICD-10-CM | POA: Diagnosis not present

## 2023-11-15 DIAGNOSIS — D631 Anemia in chronic kidney disease: Secondary | ICD-10-CM | POA: Diagnosis not present

## 2023-11-15 DIAGNOSIS — N185 Chronic kidney disease, stage 5: Secondary | ICD-10-CM | POA: Diagnosis not present

## 2023-11-15 DIAGNOSIS — I132 Hypertensive heart and chronic kidney disease with heart failure and with stage 5 chronic kidney disease, or end stage renal disease: Secondary | ICD-10-CM | POA: Diagnosis not present

## 2023-11-15 DIAGNOSIS — Z87891 Personal history of nicotine dependence: Secondary | ICD-10-CM | POA: Diagnosis not present

## 2023-11-15 DIAGNOSIS — I4430 Unspecified atrioventricular block: Secondary | ICD-10-CM | POA: Diagnosis not present

## 2023-11-15 DIAGNOSIS — E7800 Pure hypercholesterolemia, unspecified: Secondary | ICD-10-CM | POA: Diagnosis not present

## 2023-11-15 DIAGNOSIS — C61 Malignant neoplasm of prostate: Secondary | ICD-10-CM | POA: Diagnosis not present

## 2023-11-15 DIAGNOSIS — D696 Thrombocytopenia, unspecified: Secondary | ICD-10-CM | POA: Diagnosis not present

## 2023-11-15 DIAGNOSIS — I6522 Occlusion and stenosis of left carotid artery: Secondary | ICD-10-CM | POA: Diagnosis not present

## 2023-11-15 DIAGNOSIS — R55 Syncope and collapse: Secondary | ICD-10-CM | POA: Diagnosis not present

## 2023-11-15 DIAGNOSIS — E860 Dehydration: Secondary | ICD-10-CM | POA: Diagnosis not present

## 2023-11-15 DIAGNOSIS — R739 Hyperglycemia, unspecified: Secondary | ICD-10-CM | POA: Diagnosis not present

## 2023-11-15 DIAGNOSIS — E1165 Type 2 diabetes mellitus with hyperglycemia: Secondary | ICD-10-CM | POA: Diagnosis not present

## 2023-11-15 DIAGNOSIS — I6529 Occlusion and stenosis of unspecified carotid artery: Secondary | ICD-10-CM

## 2023-11-15 DIAGNOSIS — I10 Essential (primary) hypertension: Secondary | ICD-10-CM | POA: Diagnosis not present

## 2023-11-15 DIAGNOSIS — R0689 Other abnormalities of breathing: Secondary | ICD-10-CM | POA: Diagnosis not present

## 2023-11-15 DIAGNOSIS — N12 Tubulo-interstitial nephritis, not specified as acute or chronic: Secondary | ICD-10-CM | POA: Diagnosis not present

## 2023-11-15 DIAGNOSIS — Z79899 Other long term (current) drug therapy: Secondary | ICD-10-CM | POA: Diagnosis not present

## 2023-11-15 DIAGNOSIS — R350 Frequency of micturition: Secondary | ICD-10-CM | POA: Diagnosis not present

## 2023-11-15 DIAGNOSIS — Z7984 Long term (current) use of oral hypoglycemic drugs: Secondary | ICD-10-CM | POA: Diagnosis not present

## 2023-11-15 DIAGNOSIS — Z7982 Long term (current) use of aspirin: Secondary | ICD-10-CM | POA: Diagnosis not present

## 2023-11-16 DIAGNOSIS — R55 Syncope and collapse: Secondary | ICD-10-CM | POA: Diagnosis not present

## 2023-11-21 ENCOUNTER — Telehealth: Payer: Self-pay | Admitting: Cardiovascular Disease

## 2023-11-21 ENCOUNTER — Encounter: Payer: Self-pay | Admitting: Cardiovascular Disease

## 2023-11-21 ENCOUNTER — Ambulatory Visit: Attending: Cardiovascular Disease | Admitting: Cardiovascular Disease

## 2023-11-21 ENCOUNTER — Ambulatory Visit: Attending: Cardiovascular Disease

## 2023-11-21 VITALS — BP 130/58 | HR 75 | Ht 71.0 in | Wt 202.5 lb

## 2023-11-21 DIAGNOSIS — R82998 Other abnormal findings in urine: Secondary | ICD-10-CM | POA: Diagnosis not present

## 2023-11-21 DIAGNOSIS — E1151 Type 2 diabetes mellitus with diabetic peripheral angiopathy without gangrene: Secondary | ICD-10-CM | POA: Diagnosis not present

## 2023-11-21 DIAGNOSIS — Z79899 Other long term (current) drug therapy: Secondary | ICD-10-CM

## 2023-11-21 DIAGNOSIS — I10 Essential (primary) hypertension: Secondary | ICD-10-CM

## 2023-11-21 DIAGNOSIS — N390 Urinary tract infection, site not specified: Secondary | ICD-10-CM | POA: Diagnosis not present

## 2023-11-21 DIAGNOSIS — I6523 Occlusion and stenosis of bilateral carotid arteries: Secondary | ICD-10-CM

## 2023-11-21 DIAGNOSIS — R55 Syncope and collapse: Secondary | ICD-10-CM

## 2023-11-21 DIAGNOSIS — N185 Chronic kidney disease, stage 5: Secondary | ICD-10-CM | POA: Diagnosis not present

## 2023-11-21 DIAGNOSIS — M79671 Pain in right foot: Secondary | ICD-10-CM | POA: Diagnosis not present

## 2023-11-21 NOTE — Patient Instructions (Addendum)
 Medication Instructions:  No changes  If you need a refill on your cardiac medications before your next appointment, please call your pharmacy.   Lab work: [employer] today  Testing/Procedures:  Zio monitor for syncope  Your physician has requested that you have a carotid duplex. This test is an ultrasound of the carotid arteries in your neck. It looks at blood flow through these arteries that supply the brain with blood.   Allow one hour for this exam.  There are no restrictions or special instructions.  This will take place at [address] [facility] [address], [place] [address]  Please note: We ask at that you not bring children with you during ultrasound (echo/ vascular) testing. Due to room size and safety concerns, children are not allowed in the ultrasound rooms during exams. Our front office staff cannot provide observation of children in our lobby area while testing is being conducted. An adult accompanying a patient to their appointment will only be allowed in the ultrasound room at the discretion of the ultrasound technician under special circumstances. We apologize for any inconvenience.   ZIO XT- Long Term Monitor Instructions  Your physician has requested you wear a ZIO patch monitor for 14 days.  This is a single patch monitor. [employer] supplies one patch monitor per enrollment. Additional stickers are not available. Please do not apply patch if you will be having a Nuclear Stress Test,  Echocardiogram, Cardiac CT, MRI, or Chest Xray during the period you would be wearing the  monitor. The patch cannot be worn during these tests. You cannot remove and re-apply the  ZIO XT patch monitor.  Your ZIO patch monitor will be mailed 3 day USPS to your address on file. It may take 3-5 days  to receive your monitor after you have been enrolled.  Once you have received your monitor, please review the enclosed instructions. Your monitor  has already been registered assigning  a specific monitor serial # to you.  Billing and Patient Assistance Program Information  We have supplied [employer] with any of your insurance information on file for billing purposes. [employer] offers a sliding scale Patient Assistance Program for patients that do not have  insurance, or whose insurance does not completely cover the cost of the ZIO monitor.  You must apply for the Patient Assistance Program to qualify for this discounted rate.  To apply, please call [employer] at [phone], select option 4, select option 2, ask to apply for  Patient Assistance Program. Sanna Crystal will ask your household income, and how many people  are in your household. They will quote your out-of-pocket cost based on that information.  [employer] will also be able to set up a 48-month, interest-free payment plan if needed.  Applying the monitor   Shave hair from upper left chest.  Hold abrader disc by orange tab. Rub abrader in 40 strokes over the upper left chest as  indicated in your monitor instructions.  Clean area with 4 enclosed alcohol  pads. Let dry.  Apply patch as indicated in monitor instructions. Patch will be placed under collarbone on left  side of chest with arrow pointing upward.  Rub patch adhesive wings for 2 minutes. Remove white label marked "1". Remove the white  label marked "2". Rub patch adhesive wings for 2 additional minutes.  While looking in a mirror, press and release button in center of patch. A small green light will  flash 3-4 times. This will be your only indicator that the monitor  has been turned on.  Do not shower for the first 24 hours. You may shower after the first 24 hours.  Press the button if you feel a symptom. You will hear a small click. Record Date, Time and  Symptom in the Patient Logbook.  When you are ready to remove the patch, follow instructions on the last 2 pages of Patient  Logbook. Stick patch monitor onto the last page of Patient Logbook.  Place Patient  Logbook in the blue and white box. Use locking tab on box and tape box closed  securely. The blue and white box has prepaid postage on it. Please place it in the mailbox as  soon as possible. Your physician should have your test results approximately 7 days after the  monitor has been mailed back to [facility].  Call [facility] Customer Care at [phone] if you have questions regarding  your ZIO XT patch monitor. Call them immediately if you see an orange light blinking on your  monitor.  If your monitor falls off in less than 4 days, contact our Monitor department at [phone].  If your monitor becomes loose or falls off after 4 days call [employer] at [phone] for  suggestions on securing your monitor   Follow-Up: At [facility], you and your health needs are our priority.  As part of our continuing mission to provide you with exceptional heart care, we have created designated Provider Care Teams.  These Care Teams include your primary Cardiologist (physician) and Advanced Practice Providers (APPs -  Physician Assistants and Nurse Practitioners) who all work together to provide you with the care you need, when you need it.  You will need a follow up appointment in 12 months  Providers on your designated Care Team:   Laneta Pintos, NP Varney Gentleman, PA-C Cadence Gennaro Khat, [place]  COVID-19 Vaccine Information can be found at: [email or web address] For questions related to vaccine distribution or appointments, please email [email or web address] or call [phone].

## 2023-11-21 NOTE — Telephone Encounter (Signed)
 Error

## 2023-11-21 NOTE — Progress Notes (Signed)
 Cardiology Office Note  Date:  11/21/2023   ID:  [name], DOB [birth year], MRN [id number]  PCP:  Austin Coons, MD   Chief Complaint  Patient presents with   Loss of Consciousness    Patient referred by Dr. Jesse Goodwin to discuss a syncopal spell the occurred last Tuesday, November 15, 2023. Patient denies any further symptoms of syncope.     HPI:  Austin Goodwin is a 73 year old gentleman with past medical history of End-stage renal disease on hemodialysis, quit 2-3 years ago CR 4.4 Hyperlipidemia PAD, carotid stenosis, occluded on left 100% in 2011 Essential hypertension Diabetes type 2 Former smoker, quit 2011 Who presents for follow-up of his PAD, recent episode of syncope  Last seen by myself in clinic September 2024 Episode of syncope November 15, 2023 No breakfast that morning, took his morning medications Standing talking with his wife leaning on his car when he went down Pulled himself back up, standing again followed by recurrent episode of syncope that was witnessed He reports having short warning but not much Was seen in the emergency room [facility] Potassium 2.6, Echo, essentially normal Received potassium 120 mill equivalents before leaving AMA It was felt his syncope was secondary to electrolyte abnormality and drug interactions It was recommended he stop metolazone and Flomax  at discharge given he was also on Cardura Zio patch was discussed but not ordered At discharge he was given potassium 40 mill equivalents for 5 days Discharged on torsemide 20 twice daily Metolazone and Flomax  on hold  Since then he reports feeling much better, no near-syncope or syncope, no lightheadedness [place] - Without the metolazone denies significant ankle swelling or fluid retention  EKG personally reviewed by myself on todays visit EKG Interpretation Date/Time:  Monday November 21 2023 14:26:18 EDT Ventricular Rate:  75 PR Interval:  214 QRS Duration:  112 QT  Interval:  418 QTC Calculation: 466 R Axis:   44  Text Interpretation: Sinus rhythm with 1st degree A-V block When compared with ECG of 01-Apr-2023 10:09, No significant change was found Confirmed by Austin Goodwin [id number]) on 11/21/2023 2:35:54 PM    2011: Reports he was acutely disoriented , dizzy Was evaluated in the hospital/[facility] Noted to have carotid stenosis, 100% stenosis on left  Quit smoking at that time  Carotid ultrasound May 2023 Right Carotid: Velocities in the right ICA are consistent with a 1-39%  stenosis.  Left Carotid: Evidence consistent with a total occlusion of the left ICA.    PMH:   has a past medical history of Diabetes mellitus without complication (HCC), Dysplastic nevus (12/23/2022), and Hypertension.  PSH:    Past Surgical History:  Procedure Laterality Date   CATARACT EXTRACTION, BILATERAL     DIALYSIS/PERMA CATHETER INSERTION N/A 08/10/2019   Procedure: DIALYSIS/PERMA CATHETER INSERTION;  Surgeon: Austin College, MD;  Location: [facility] INVASIVE CV LAB;  Service: Cardiovascular;  Laterality: N/A;   DIALYSIS/PERMA CATHETER REMOVAL N/A 09/24/2019   Procedure: DIALYSIS/PERMA CATHETER REMOVAL;  Surgeon: Austin College, MD;  Location: [facility] INVASIVE CV LAB;  Service: Cardiovascular;  Laterality: N/A;    Current Outpatient Medications  Medication Sig Dispense Refill   allopurinol (ZYLOPRIM) 100 MG tablet Take 100 mg by mouth daily.     amLODipine  (NORVASC ) 10 MG tablet Take 1 tablet (10 mg total) by mouth daily. 30 tablet 0   aspirin  81 MG chewable tablet Chew 81 mg by mouth at bedtime.     cyanocobalamin  1000 MCG tablet Take 1,000 mcg  by mouth daily.     doxazosin (CARDURA) 4 MG tablet Take 4 mg by mouth 3 (three) times a week.     glipiZIDE  (GLUCOTROL  XL) 5 MG 24 hr tablet Take 5 mg by mouth 2 (two) times daily.     Multiple Vitamin (MULTIVITAMIN) tablet Take 1 tablet by mouth daily.     omeprazole (PRILOSEC) 20 MG capsule Take 1 capsule by mouth daily.      potassium chloride  SA (KLOR-CON  M) 20 MEQ tablet Take 40 mEq by mouth daily. X 5 days     rosuvastatin  (CRESTOR ) 10 MG tablet Take 1 tablet (10 mg total) by mouth daily. Wednesday & Sunday 90 tablet 3   sodium bicarbonate  650 MG tablet Take 1,300 mg by mouth 2 (two) times daily.     torsemide (DEMADEX) 20 MG tablet Take 40 mg by mouth in the morning.     zolpidem (AMBIEN) 5 MG tablet Take 5 mg by mouth as needed.     metolazone (ZAROXOLYN) 2.5 MG tablet Take 2.5 mg by mouth daily. (Patient not taking: Reported on 11/21/2023)     tamsulosin  (FLOMAX ) 0.4 MG CAPS capsule Take 1 capsule (0.4 mg total) by mouth daily after supper. (Patient not taking: Reported on 11/21/2023) 90 capsule 4   traMADol  (ULTRAM ) 50 MG tablet Take 50 mg by mouth as needed for pain. (Patient not taking: Reported on 11/21/2023)     No current facility-administered medications for this visit.    Allergies:   Levofloxacin    Social History:  The patient  reports that he quit smoking about 24 years ago. His smoking use included cigarettes. He has never used smokeless tobacco. He reports current alcohol  use of about 1.0 standard drink of alcohol  per week. He reports that he does not use drugs.   Family History:   family history includes Angina in his maternal grandmother; Angina (age of onset: 7) in his father; Cancer in his paternal grandmother; Heart disease in his father; Mitral valve prolapse in his mother.    Review of Systems: Review of Systems  Constitutional: Negative.   HENT: Negative.    Respiratory: Negative.    Cardiovascular: Negative.   Gastrointestinal: Negative.   Musculoskeletal: Negative.   Neurological: Negative.   Psychiatric/Behavioral: Negative.    All other systems reviewed and are negative.  PHYSICAL EXAM: VS:  BP (!) 130/58 (BP Location: Left Arm, Patient Position: Sitting, Cuff Size: Normal)   Ht 5\' 11"  (1.803 m)   Wt 202 lb 8 oz (91.9 kg)   SpO2 97%   BMI 28.24 kg/m  , BMI Body mass index  is 28.24 kg/m. Constitutional:  oriented to person, place, and time. No distress.  HENT:  Head: Grossly normal Eyes:  no discharge. No scleral icterus.  Neck: No JVD, no carotid bruits  Cardiovascular: Regular rate and rhythm, no murmurs appreciated Pulmonary/Chest: Clear to auscultation bilaterally, no wheezes or rails Abdominal: Soft.  no distension.  no tenderness.  Musculoskeletal: Normal range of motion Neurological:  normal muscle tone. Coordination normal. No atrophy Skin: Skin warm and dry Psychiatric: normal affect, pleasant  Recent Labs: 08/25/2023: Hemoglobin 11.9; Platelet Count 142   Lipid Panel No results found for: "CHOL", "HDL", "LDLCALC", "TRIG"    Wt Readings from Last 3 Encounters:  11/21/23 202 lb 8 oz (91.9 kg)  10/24/23 205 lb (93 kg)  06/30/23 198 lb (89.8 kg)     ASSESSMENT AND PLAN:  Problem List Items Addressed This Visit  Cardiology Problems   Essential hypertension   Relevant Medications   torsemide (DEMADEX) 20 MG tablet   doxazosin (CARDURA) 4 MG tablet   Other Relevant Orders   EKG 12-Lead   Other Visit Diagnoses       Bilateral carotid artery stenosis    -  Primary   Relevant Medications   torsemide (DEMADEX) 20 MG tablet   doxazosin (CARDURA) 4 MG tablet   Other Relevant Orders   EKG 12-Lead     Syncope and collapse       Relevant Orders   EKG 12-Lead      PAD 100% carotid stenosis on left, nonobstructive on right On aspirin  81 daily -Given recent syncope, repeat carotid ultrasound ordered Continue Crestor , previously we had recommended he take this daily  Chronic kidney disease stage IV Previously on dialysis, stopped several years ago Creatinine close to 4 Metolazone held after recent episodes of syncope Recommend he call our office for ankle swelling  Diabetes type 2 with complications Management per care  Cardiac risk factors Currently with no symptoms of angina. No further workup at this time. Continue  current medication regimen.  For symptoms concerning for angina or even recurrent syncope could consider ischemic workup  Syncope Felt secondary to potassium 2.8, hypovolemia Better off metolazone and off Flomax  Recommend he call us  for any further lightheadedness, near syncope  Essential hypertension Blood pressure stable in the 130-140 range  Records from [facility]  requested and reviewed  Signed, Juanda Noon, M.D., Ph.D. [facility] [place], [place] [phone]

## 2023-11-22 LAB — BASIC METABOLIC PANEL WITH GFR
BUN/Creatinine Ratio: 16 (ref 10–24)
BUN: 68 mg/dL — ABNORMAL HIGH (ref 8–27)
CO2: 19 mmol/L — ABNORMAL LOW (ref 20–29)
Calcium: 9.6 mg/dL (ref 8.6–10.2)
Chloride: 96 mmol/L (ref 96–106)
Creatinine, Ser: 4.13 mg/dL — ABNORMAL HIGH (ref 0.76–1.27)
Glucose: 321 mg/dL — ABNORMAL HIGH (ref 70–99)
Potassium: 3.4 mmol/L — ABNORMAL LOW (ref 3.5–5.2)
Sodium: 139 mmol/L (ref 134–144)
eGFR: 14 mL/min/{1.73_m2} — ABNORMAL LOW (ref >59)

## 2023-11-28 ENCOUNTER — Encounter: Payer: Self-pay | Admitting: Emergency Medicine

## 2023-11-28 DIAGNOSIS — I10 Essential (primary) hypertension: Secondary | ICD-10-CM | POA: Diagnosis not present

## 2023-11-29 DIAGNOSIS — S92354A Nondisplaced fracture of fifth metatarsal bone, right foot, initial encounter for closed fracture: Secondary | ICD-10-CM | POA: Diagnosis not present

## 2023-12-12 ENCOUNTER — Encounter

## 2023-12-15 DIAGNOSIS — R55 Syncope and collapse: Secondary | ICD-10-CM | POA: Diagnosis not present

## 2023-12-18 DIAGNOSIS — R55 Syncope and collapse: Secondary | ICD-10-CM | POA: Diagnosis not present

## 2023-12-23 DIAGNOSIS — I120 Hypertensive chronic kidney disease with stage 5 chronic kidney disease or end stage renal disease: Secondary | ICD-10-CM | POA: Diagnosis not present

## 2023-12-23 DIAGNOSIS — R55 Syncope and collapse: Secondary | ICD-10-CM | POA: Diagnosis not present

## 2023-12-23 DIAGNOSIS — E1151 Type 2 diabetes mellitus with diabetic peripheral angiopathy without gangrene: Secondary | ICD-10-CM | POA: Diagnosis not present

## 2023-12-30 DIAGNOSIS — I10 Essential (primary) hypertension: Secondary | ICD-10-CM | POA: Diagnosis not present

## 2024-01-08 DIAGNOSIS — R55 Syncope and collapse: Secondary | ICD-10-CM

## 2024-01-10 ENCOUNTER — Ambulatory Visit: Payer: Self-pay | Admitting: Cardiovascular Disease

## 2024-01-10 ENCOUNTER — Encounter: Payer: Self-pay | Admitting: Emergency Medicine

## 2024-01-12 ENCOUNTER — Other Ambulatory Visit: Payer: Self-pay | Admitting: *Deleted

## 2024-01-12 DIAGNOSIS — C61 Malignant neoplasm of prostate: Secondary | ICD-10-CM

## 2024-01-16 ENCOUNTER — Inpatient Hospital Stay: Attending: Radiation Oncology

## 2024-01-16 DIAGNOSIS — C61 Malignant neoplasm of prostate: Secondary | ICD-10-CM | POA: Insufficient documentation

## 2024-01-16 LAB — PSA: Prostatic Specific Antigen: 0.07 ng/mL (ref 0.00–4.00)

## 2024-01-23 ENCOUNTER — Encounter: Payer: Self-pay | Admitting: Radiation Oncology

## 2024-01-23 ENCOUNTER — Ambulatory Visit
Admission: RE | Admit: 2024-01-23 | Discharge: 2024-01-23 | Disposition: A | Discharge status: Home / Self Care | Source: Ambulatory Visit | Attending: Radiation Oncology | Admitting: Radiation Oncology

## 2024-01-23 ENCOUNTER — Encounter

## 2024-01-23 VITALS — BP 143/69 | HR 66 | Temp 98.9°F | Resp 19 | Wt 207.8 lb

## 2024-01-23 DIAGNOSIS — N119 Chronic tubulo-interstitial nephritis, unspecified: Secondary | ICD-10-CM | POA: Diagnosis not present

## 2024-01-23 DIAGNOSIS — Z191 Hormone sensitive malignancy status: Secondary | ICD-10-CM | POA: Diagnosis not present

## 2024-01-23 DIAGNOSIS — Z923 Personal history of irradiation: Secondary | ICD-10-CM | POA: Insufficient documentation

## 2024-01-23 DIAGNOSIS — C61 Malignant neoplasm of prostate: Secondary | ICD-10-CM | POA: Insufficient documentation

## 2024-01-23 DIAGNOSIS — E876 Hypokalemia: Secondary | ICD-10-CM | POA: Diagnosis not present

## 2024-01-23 DIAGNOSIS — D472 Monoclonal gammopathy: Secondary | ICD-10-CM | POA: Diagnosis not present

## 2024-01-23 DIAGNOSIS — N185 Chronic kidney disease, stage 5: Secondary | ICD-10-CM | POA: Diagnosis not present

## 2024-01-23 DIAGNOSIS — I10 Essential (primary) hypertension: Secondary | ICD-10-CM | POA: Diagnosis not present

## 2024-01-23 NOTE — Progress Notes (Signed)
 Radiation Oncology Follow up Note  Name: Austin Goodwin   Date:   01/23/2024 MRN:  [id number] DOB: 08-13-1950    This 73 y.o. male presents to the clinic today for 84-month follow-up status post image guided IMRT radiation therapy to his prostate for Gleason 7 adenocarcinoma presenting with a PSA in the 8 range stage IIb (cT2 N0 M0).  REFERRING PROVIDER: [name], MD  HPI: Patient is a 72 year old male now out 4 months having completed external beam image guided IMRT radiation therapy to his prostate for Gleason 7 adenocarcinoma presenting with a PSA in the 8 range.  Seen today in routine follow-up he is doing well..  He has nocturia x 2-3 is currently on Flomax .  He is having no diarrhea.  His most recent PSA is 0.07 showing excellent biochemical control of his disease  COMPLICATIONS OF TREATMENT: none  FOLLOW UP COMPLIANCE: keeps appointments   PHYSICAL EXAM:  BP (!) 143/69   Pulse 66   Temp 98.9 F (37.2 C)   Resp 19   Wt 207 lb 12.8 oz (94.3 kg)   SpO2 99%   BMI 28.98 kg/m  Well-developed well-nourished patient in NAD. HEENT reveals PERLA, EOMI, discs not visualized.  Oral cavity is clear. No oral mucosal lesions are identified. Neck is clear without evidence of cervical or supraclavicular adenopathy. Lungs are clear to A&P. Cardiac examination is essentially unremarkable with regular rate and rhythm without murmur rub or thrill. Abdomen is benign with no organomegaly or masses noted. Motor sensory and DTR levels are equal and symmetric in the upper and lower extremities. Cranial nerves II through XII are grossly intact. Proprioception is intact. No peripheral adenopathy or edema is identified. No motor or sensory levels are noted. Crude visual fields are within normal range.  RADIOLOGY RESULTS: No current films for review  PLAN: Present time patient is doing well with low side effect profile and excellent biochemical result response to treatment.  I am pleased with his  overall progress.  Of asked to see him back in 6 months with a repeat PSA.  Patient knows to call with any concerns.  I would like to take this opportunity to thank you for allowing me to participate in the care of your patient.SABRA Marcey Penton, MD

## 2024-01-29 DIAGNOSIS — I10 Essential (primary) hypertension: Secondary | ICD-10-CM | POA: Diagnosis not present

## 2024-02-15 ENCOUNTER — Other Ambulatory Visit

## 2024-02-17 DIAGNOSIS — E119 Type 2 diabetes mellitus without complications: Secondary | ICD-10-CM | POA: Diagnosis not present

## 2024-02-17 DIAGNOSIS — Z961 Presence of intraocular lens: Secondary | ICD-10-CM | POA: Diagnosis not present

## 2024-02-20 ENCOUNTER — Ambulatory Visit
Admission: RE | Admit: 2024-02-20 | Discharge: 2024-02-20 | Disposition: A | Discharge status: Home / Self Care | Source: Ambulatory Visit | Attending: Endocrinology | Admitting: Endocrinology

## 2024-02-20 DIAGNOSIS — I6523 Occlusion and stenosis of bilateral carotid arteries: Secondary | ICD-10-CM | POA: Diagnosis not present

## 2024-02-20 DIAGNOSIS — I6529 Occlusion and stenosis of unspecified carotid artery: Secondary | ICD-10-CM | POA: Insufficient documentation

## 2024-02-21 DIAGNOSIS — I120 Hypertensive chronic kidney disease with stage 5 chronic kidney disease or end stage renal disease: Secondary | ICD-10-CM | POA: Diagnosis not present

## 2024-02-21 DIAGNOSIS — E1151 Type 2 diabetes mellitus with diabetic peripheral angiopathy without gangrene: Secondary | ICD-10-CM | POA: Diagnosis not present

## 2024-02-22 ENCOUNTER — Ambulatory Visit: Admitting: Radiation Oncology

## 2024-02-29 DIAGNOSIS — I10 Essential (primary) hypertension: Secondary | ICD-10-CM | POA: Diagnosis not present

## 2024-03-09 ENCOUNTER — Telehealth: Payer: Self-pay

## 2024-03-09 ENCOUNTER — Telehealth: Payer: Self-pay | Admitting: Cardiovascular Disease

## 2024-03-09 NOTE — Telephone Encounter (Signed)
 I spoke with Delwin, who is with the patient's primary care at [facility]. The patient is participating in a community program that monitors blood pressures, and they had called his PCP regarding his BP target goal;After reviewing the office visit notes with Dr. Gollan concerning the patient's essential hypertension, it was noted that the patient's BP is stable with systolic BP ranging from 130-140; The patient is currently prescribed Amlodipine  (Norvasc ) 10mg  daily and Doxazosin (Cardura) 4mg  three times per week. Delwin is faxing over the readings from the community program for Dr. Perla to review; the patient will be seeing his PCP in October. Delwin thanked me for taking the time to call and discuss the patient's blood pressure with her, and she will reach out to the community program.

## 2024-03-09 NOTE — Telephone Encounter (Signed)
 error

## 2024-03-09 NOTE — Telephone Encounter (Signed)
 Office is calling in concern with the patient bp. Calling to see where the dr thinks it should be at. Please advise

## 2024-03-16 ENCOUNTER — Telehealth: Payer: Self-pay | Admitting: Cardiovascular Disease

## 2024-03-16 NOTE — Telephone Encounter (Signed)
 Pt c/o BP issue: STAT if pt c/o blurred vision, one-sided weakness or slurred speech  1. What are your last 5 BP readings? Runs from 180-120  2. Are you having any other symptoms (ex. Dizziness, headache, blurred vision, passed out)? Dizziness, passed out; headache   3. What is your BP issue? Runs high and low    Wife wanted to go ahead and get him schd for 8/26

## 2024-03-16 NOTE — Telephone Encounter (Signed)
 Called and spoke with wife per DPR. Wife reports that patient is wearing a continuous blood pressure monitor. Wife reports that patient's blood pressure has been up and down. She states that patient's blood pressure readings are being faxed over. Wife reports that patient had a syncopal episode about 3 months ago and was seen in the ED. Wife reports that patient was seen by Dr. Gollan in June with similar symptoms but his symptoms have not improved. Patient previously scheduled to be seen in clinic on 03/22/24. Wife made aware of ED precaution should any new symptoms develop or worsen.

## 2024-03-20 ENCOUNTER — Encounter: Payer: Self-pay | Admitting: Cardiology

## 2024-03-20 ENCOUNTER — Ambulatory Visit: Attending: Cardiology | Admitting: Cardiology

## 2024-03-20 VITALS — BP 140/65 | HR 71 | Ht 71.0 in | Wt 206.6 lb

## 2024-03-20 DIAGNOSIS — I6523 Occlusion and stenosis of bilateral carotid arteries: Secondary | ICD-10-CM | POA: Diagnosis not present

## 2024-03-20 DIAGNOSIS — N184 Chronic kidney disease, stage 4 (severe): Secondary | ICD-10-CM

## 2024-03-20 DIAGNOSIS — I10 Essential (primary) hypertension: Secondary | ICD-10-CM | POA: Diagnosis not present

## 2024-03-20 DIAGNOSIS — E1122 Type 2 diabetes mellitus with diabetic chronic kidney disease: Secondary | ICD-10-CM

## 2024-03-20 DIAGNOSIS — I739 Peripheral vascular disease, unspecified: Secondary | ICD-10-CM

## 2024-03-20 DIAGNOSIS — R55 Syncope and collapse: Secondary | ICD-10-CM

## 2024-03-20 DIAGNOSIS — E118 Type 2 diabetes mellitus with unspecified complications: Secondary | ICD-10-CM

## 2024-03-20 MED ORDER — HYDRALAZINE HCL 25 MG PO TABS
25.0000 mg | ORAL_TABLET | Freq: Every day | ORAL | 3 refills | Status: DC
Start: 2024-03-20 — End: 2024-05-14

## 2024-03-20 NOTE — Telephone Encounter (Signed)
 Received the following meassge from provider:  Will you call him/his wife and tell them to drop doxazosin  to 2 mg nightly and start taking hydralazine  25 mg at lunch. I am sending in the new Rx now and have conversed with Dr Dennise his nephrologist via epic chat and he agrees with the changes. Please and thank you  Called and spoke with pt and wife, med action and directions for use discussed and Mrs. Needles reports pt now dosed with sodium bicarbonate  corrected dosage.  Family very thankful for Sheri,she's our knight in Hydrologist

## 2024-03-20 NOTE — Patient Instructions (Signed)
 Medication Instructions:  Your physician recommends that you continue on your current medications as directed. Please refer to the Current Medication list given to you today.  *If you need a refill on your cardiac medications before your next appointment, please call your pharmacy*  Lab Work: No labs ordered today  If you have labs (blood work) drawn today and your tests are completely normal, you will receive your results only by: MyChart Message (if you have MyChart) OR A paper copy in the mail If you have any lab test that is abnormal or we need to change your treatment, we will call you to review the results.  Testing/Procedures: No test ordered today   Follow-Up: At [facility], you and your health needs are our priority.  As part of our continuing mission to provide you with exceptional heart care, our providers are all part of one team.  This team includes your primary Cardiologist (physician) and Advanced Practice Providers or APPs (Physician Assistants and Nurse Practitioners) who all work together to provide you with the care you need, when you need it.  Your next appointment:   6 week(s)  Provider:   You will see one of the following Advanced Practice Providers on your designated Care Team:   Lonni Meager, NP Lesley Maffucci, PA-C Bernardino Bring, PA-C Cadence Long Hill, PA-C Tylene Lunch, NP Barnie Hila, NP      We recommend signing up for the patient portal called MyChart.  Sign up information is provided on this After Visit Summary.  MyChart is used to connect with patients for Virtual Visits (Telemedicine).  Patients are able to view lab/test results, encounter notes, upcoming appointments, etc.  Non-urgent messages can be sent to your provider as well.   To learn more about what you can do with MyChart, go to [email or web address].

## 2024-03-20 NOTE — Progress Notes (Signed)
 Cardiology Office Note   Date:  03/20/2024  ID:  Austin Goodwin, DOB Nov 22, 1950, MRN [id number] PCP: [name], MD  [facility] Providers Cardiologist:  None     History of Present Illness Austin Goodwin is a 73 y.o. male with a past medical history of end-stage renal disease on hemodialysis, hyperlipidemia, peripheral arterial disease, carotid stenosis (occluded on the left 100% in 2011), primary hypertension, type 2 diabetes, former smoker (quit 2011), syncopal episodes, who is here today for follow-up.   In 2011 he reports he was acutely disoriented, dizzy, was evaluated by [facility]  He was noted to have carotid stenosis.  100% stenosis on the left.  At that time he had quit smoking.  Carotid ultrasound completed in May 2023 revealed right ICA with consistent with 1-39% stenosis, in the left showed total occlusion.  He had an episode of syncope that was noted in November 15, 2023.  Standing talking with his wife leaning on his car and he went down.  Stated that he had pulled his self back up followed by recurrent episode of syncope that was witnessed.  He was evaluated in the emergency department at [facility] and was noted to have potassium of 2.6.  Electrolytes were repleted.  Echocardiogram completed which was essentially normal before leaving AMA.  It was felt the syncope was secondary to electrolyte abnormality and drug interactions.  It was recommended that he stop metolazone and Flomax  at discharge given he was also on Cardura .  Zio patch was discussed but not ordered.  He was continued on torsemide 20 mg daily with metolazone and Flomax  remaining on hold.   He was last seen in clinic in April 2025 by Dr. Gollan.  He was referred by his primary care provider to discuss a syncopal spell that occurred the following week.  He denied any further symptoms of syncope after the episode.  He is feeling much better with no recurrence he had continue [place].  He was without  metolazone and denied significant ankle swelling or fluid retention.  EKG revealed sinus rhythm with a rate of 75 with a first-degree AV block with no significant change from prior studies.  Repeat carotid ultrasound was ordered.  He returns to clinic today accompanied by his wife.  He has been having issues with elevated blood pressures and then drops in blood pressures with blood pressure logs being uploaded to the media tab of his chart.  He also previously had a syncopal episode he thought may have been related to drops in his blood pressure.  Previous history of syncope which was thought to be related to electrolyte derangement.  He stated he had previously been restarted on metolazone by his nephrologist due to fluid overload.  He denies any recurrent swelling other than 1 foot but denies any abdominal bloating or early CAD.  States that he has been compliant with his current medication regimen.  His wife has adjusted his daily regimen from time to time to try to decrease some of his symptoms with no change in symptoms.  He denies recent hospitalizations or visits to the emergency department.  ROS: 10 point review of systems has been reviewed and considered negative except what is listed in the HPI  Studies Reviewed     Carotid Duplex 02/20/2024 IMPRESSION: 1. Right carotid system: Mild atherosclerotic changes in the right internal carotid artery with estimated stenosis measuring less than 50%. 2. Left carotid system: Chronic occlusion of the left  ICA.  Event Monitor (Zio) 12/20/2023 Normal sinus rhythm Patient had a min HR of 53 bpm, max HR of 132 bpm, and avg HR of 78 bpm.    Isolated SVEs were rare (<1.0%), and no SVE Couplets or SVE Triplets were present.    Isolated VEs were rare (<1.0%), and no VE Couplets or VE Triplets were present.    No patient triggered events recorded Risk Assessment/Calculations     Physical Exam VS:  BP (!) 140/65   Pulse 71   Ht 5' 11 (1.803 m)   Wt  206 lb 9.6 oz (93.7 kg)   SpO2 94%   BMI 28.81 kg/m        Wt Readings from Last 3 Encounters:  03/20/24 206 lb 9.6 oz (93.7 kg)  01/23/24 207 lb 12.8 oz (94.3 kg)  11/21/23 202 lb 8 oz (91.9 kg)    GEN: Well nourished, well developed in no acute distress NECK: No JVD; No carotid bruits CARDIAC: RRR, no murmurs, rubs, gallops RESPIRATORY:  Clear to auscultation without rales, wheezing or rhonchi  ABDOMEN: Soft, non-tender, non-distended EXTREMITIES:  No edema; No deformity   ASSESSMENT AND PLAN Peripheral arterial disease without 100%carotid stenosis on the left and less than 50% on the right on most recent carotid duplex.  He is continued on aspirin  81 mg daily and rosuvastatin  10 mg daily recommend he had repeat carotid duplexes annually.  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  Creatinine is around 4.  Metolazone recently restarted by nephrology.  He is also continued on torsemide 40 mg in the morning.  Will recheck to nephrology about potential changes in current medication regimen.  Wife states he had only been taking sodium bicarbonate  650 milligrams 1 tablet twice daily versus 2 tablets twice daily.  Encouraged to take sodium bicarb at as prescribed by nephrology.  Type 2 diabetes with last hemoglobin A1c of 9.7.  He is continued on glipizide .  Ongoing management per his PCP.  Primary hypertension with a blood pressure today 140/65.  Blood pressure log has blood pressures of 112 open upwards of 178.  Blood pressure states well-controlled with a.m. dosing of medications but continues to be elevated in the evening.  Would consider decreasing doxazosin  from 4 mg nightly to 2 mg and taking hydroxyzine mid afternoon to help offset some of his orthostasis symptoms.  Currently he is continued on amlodipine  10 mg daily, reduction of Cardura  to 2 mg nightly, torsemide 40 mg daily and Zaroxolyn 2.5 mg weekly.  Will reach out to his primary  nephrologist to determine if he is okay with decreasing his doxazosin  and starting midday hydralazine  dose.  He and his wife have been encouraged to continue to monitor pressures 1 to 2 hours postmedication administration as well.  Recurrent syncope with the last episode several months back with continued lightheadedness and dizziness.  He has had no recurrence.  Was evaluated at [facility] and was noted to have severe hypokalemia.  Has been continued on potassium supplements with his last potassium level 3.9 on 01/23/2024.  He has upcoming repeat labs with nephrology.  If he continues to have issues with hypokalemia can consider adding spironolactone to his diuretic regimen if okay with nephrology.       Dispo: Patient to return to clinic to see MD/APP in 6 weeks or sooner if needed for reevaluation after current changes of blood pressure medication regimen have been made.   Reached out to primary  nephrologist Dr. Dennise via epic chat today and inquired about reducing his doxazosin  dose to 2 mg daily from before and adding in mid afternoon dose of hydralazine  25 mg daily.  He is in agreements with the changes to his current medication regimen.   Signed, Sani Madariaga, NP

## 2024-03-21 ENCOUNTER — Other Ambulatory Visit: Payer: Self-pay

## 2024-03-21 MED ORDER — DOXAZOSIN MESYLATE 4 MG PO TABS: 2.0000 mg | ORAL_TABLET | Freq: Every evening | ORAL | 3 refills | Status: DC

## 2024-03-21 NOTE — Telephone Encounter (Signed)
 Pt's pharmacy [facility] pharmacy is requesting a refill on medication Doxazosin  4 mg tablet. Per Maeola Lunch, NP last visit on 03/20/24 Would consider decreasing doxazosin  from 4 mg nightly to 2 mg and taking hydroxyzine mid afternoon to help offset some of his orthostasis symptoms. This was not done at visit. Please address before a refill. Thanks

## 2024-03-21 NOTE — Telephone Encounter (Signed)
 He currently only needs 2 mg tablets.

## 2024-03-27 DIAGNOSIS — M1712 Unilateral primary osteoarthritis, left knee: Secondary | ICD-10-CM | POA: Diagnosis not present

## 2024-03-27 DIAGNOSIS — M170 Bilateral primary osteoarthritis of knee: Secondary | ICD-10-CM | POA: Diagnosis not present

## 2024-03-27 DIAGNOSIS — M1711 Unilateral primary osteoarthritis, right knee: Secondary | ICD-10-CM | POA: Diagnosis not present

## 2024-03-28 ENCOUNTER — Other Ambulatory Visit: Payer: Self-pay | Admitting: Orthopedic Surgery

## 2024-03-28 DIAGNOSIS — M1711 Unilateral primary osteoarthritis, right knee: Secondary | ICD-10-CM

## 2024-03-30 ENCOUNTER — Telehealth: Payer: Self-pay | Admitting: Cardiovascular Disease

## 2024-03-30 DIAGNOSIS — I10 Essential (primary) hypertension: Secondary | ICD-10-CM | POA: Diagnosis not present

## 2024-03-30 DIAGNOSIS — R0609 Other forms of dyspnea: Secondary | ICD-10-CM

## 2024-03-30 NOTE — Telephone Encounter (Signed)
 Called and spoke with wife per DPR. Per wife around noon patient started feeling short of breath. Patient checked his blood pressure and it was 125/62 and heart rate 104. Wife said patient sat down and relaxed and currently patient denies symptoms. Patient current bp 148/73 and hr 103. Wife reports that blood pressure has been up and down. Patient recently seen in clinic and Doxazosin  was decreased from 4 MG to 2 MG. Wife reports the following recent vital signs.  03/26/24    BP 133/73 hr 80  AM         151/65     95  PM  03/27/24   156/84    77 AM  172/79     89 PM  03/28/24  146/78      82 AM  151/67      88 PM 03/29/24  156/71        98 PM  125/69       88  AM  Patient has an appointment with Dr. Dennise 04/02/24. Wife requesting recommendations from Tylene Lunch, NP.

## 2024-03-30 NOTE — Telephone Encounter (Signed)
 Called patient and left message for call back.

## 2024-03-30 NOTE — Telephone Encounter (Signed)
 Pt c/o Shortness Of Breath: STAT if SOB developed within the last 24 hours or pt is noticeably SOB on the phone  1. Are you currently SOB (can you hear that pt is SOB on the phone)? Yes  2. How long have you been experiencing SOB? Severe since this morning   3. Are you SOB when sitting or when up moving around? Both  4. Are you currently experiencing any other symptoms? Tachycardia  Pt c/o BP issue: STAT if pt c/o blurred vision, one-sided weakness or slurred speech.  STAT if BP is GREATER than 180/120 TODAY.  STAT if BP is LESS than 90/60 and SYMPTOMATIC TODAY  1. What is your BP concern? Up and down  2. Have you taken any BP medication today? Yes  3. What are your last 5 BP readings? 125/62 104 HR 158/81 HR 93 156/71 HR 95   4. Are you having any other symptoms (ex. Dizziness, headache, blurred vision, passed out)? SOB, dizziness

## 2024-03-30 NOTE — Telephone Encounter (Signed)
 Is he continuing to take hydralazine  25 mg in the afternoon?  Are these blood pressures and heart rates 1 to 2 hours postmedication administration?  What type of activity was the patient doing when he became short of breath?

## 2024-04-02 DIAGNOSIS — I10 Essential (primary) hypertension: Secondary | ICD-10-CM | POA: Diagnosis not present

## 2024-04-02 DIAGNOSIS — N2581 Secondary hyperparathyroidism of renal origin: Secondary | ICD-10-CM | POA: Diagnosis not present

## 2024-04-02 DIAGNOSIS — N185 Chronic kidney disease, stage 5: Secondary | ICD-10-CM | POA: Diagnosis not present

## 2024-04-02 DIAGNOSIS — E876 Hypokalemia: Secondary | ICD-10-CM | POA: Diagnosis not present

## 2024-04-02 DIAGNOSIS — N119 Chronic tubulo-interstitial nephritis, unspecified: Secondary | ICD-10-CM | POA: Diagnosis not present

## 2024-04-03 NOTE — Telephone Encounter (Signed)
 Can we see if it is possible to have him scheduled for a echo with the progressive dyspnea on exertion?

## 2024-04-03 NOTE — Addendum Note (Signed)
 Addended by: Zyheir Daft D on: 04/03/2024 04:24 PM   Modules accepted: Orders

## 2024-04-03 NOTE — Telephone Encounter (Signed)
 Called patient - spoke with pt wife, she states yes he is taking the hydralazine  in the afternoon - the previous BP readings were take first thing in the morning - advised them to change time of taking BP to 1-2 hours after taking medications - wife verbalized understanding -   He was walking up the steps from being outside walking when he became short of breath  Patient/wife will call in at the end of the week with BP readings taken after medication - ED advice given - wife verbalized understanding

## 2024-04-06 ENCOUNTER — Other Ambulatory Visit: Payer: Self-pay | Admitting: Cardiovascular Disease

## 2024-04-18 ENCOUNTER — Other Ambulatory Visit: Payer: Self-pay | Admitting: Nephrology

## 2024-04-18 DIAGNOSIS — R052 Subacute cough: Secondary | ICD-10-CM

## 2024-04-23 ENCOUNTER — Encounter: Payer: Self-pay | Admitting: Dermatology

## 2024-04-23 ENCOUNTER — Ambulatory Visit (INDEPENDENT_AMBULATORY_CARE_PROVIDER_SITE_OTHER): Admitting: Dermatology

## 2024-04-23 DIAGNOSIS — L720 Epidermal cyst: Secondary | ICD-10-CM

## 2024-04-23 NOTE — Patient Instructions (Signed)

## 2024-04-23 NOTE — Progress Notes (Signed)
   Follow-Up Visit   Subjective  Austin Goodwin is a 73 y.o. male who presents for the following: Spot  The patient has spots, moles and lesions to be evaluated, some may be new or changing and the patient may have concern these could be cancer.  Patient reports areas of concern on his back and neck. Hx of dysplastic nevus.    The following portions of the chart were reviewed this encounter and updated as appropriate: medications, allergies, medical history  Review of Systems:  No other skin or systemic complaints except as noted in HPI or Assessment and Plan.  Objective  Well appearing patient in no apparent distress; mood and affect are within normal limits.  A focused examination was performed of the following areas: Neck, Back  Relevant physical exam findings are noted in the Assessment and Plan.    Assessment & Plan   EPIDERMAL INCLUSION CYST Exam: Subcutaneous nodule at the occipital scalp and back x at least 3  Benign-appearing. Exam most consistent with an epidermal inclusion cyst. Discussed that a cyst is a benign growth that can grow over time and sometimes get irritated or inflamed. Recommend observation if it is not bothersome. Discussed option of surgical excision to remove it if it is growing, symptomatic, or other changes noted. Please call for new or changing lesions so they can be evaluated. EIC (EPIDERMAL INCLUSION CYST)   Return for Surgery for cyst removal.  I, Emerick Ege, CMA am acting as scribe for Boneta Sharps, MD.   Documentation: I have reviewed the above documentation for accuracy and completeness, and I agree with the above.  Boneta Sharps, MD

## 2024-04-24 DIAGNOSIS — M170 Bilateral primary osteoarthritis of knee: Secondary | ICD-10-CM | POA: Diagnosis not present

## 2024-04-29 DIAGNOSIS — I10 Essential (primary) hypertension: Secondary | ICD-10-CM | POA: Diagnosis not present

## 2024-05-08 ENCOUNTER — Ambulatory Visit: Admitting: Cardiology

## 2024-05-14 ENCOUNTER — Ambulatory Visit: Attending: Cardiology | Admitting: Cardiology

## 2024-05-14 ENCOUNTER — Encounter: Payer: Self-pay | Admitting: Cardiology

## 2024-05-14 VITALS — BP 140/70 | HR 65 | Ht 71.0 in | Wt 210.4 lb

## 2024-05-14 DIAGNOSIS — I739 Peripheral vascular disease, unspecified: Secondary | ICD-10-CM | POA: Diagnosis not present

## 2024-05-14 DIAGNOSIS — N184 Chronic kidney disease, stage 4 (severe): Secondary | ICD-10-CM

## 2024-05-14 DIAGNOSIS — I6523 Occlusion and stenosis of bilateral carotid arteries: Secondary | ICD-10-CM | POA: Diagnosis not present

## 2024-05-14 DIAGNOSIS — E118 Type 2 diabetes mellitus with unspecified complications: Secondary | ICD-10-CM

## 2024-05-14 DIAGNOSIS — Z87898 Personal history of other specified conditions: Secondary | ICD-10-CM | POA: Diagnosis not present

## 2024-05-14 DIAGNOSIS — E1122 Type 2 diabetes mellitus with diabetic chronic kidney disease: Secondary | ICD-10-CM

## 2024-05-14 DIAGNOSIS — I10 Essential (primary) hypertension: Secondary | ICD-10-CM

## 2024-05-14 MED ORDER — AMLODIPINE BESYLATE 10 MG PO TABS: 10.0000 mg | ORAL_TABLET | Freq: Every day | ORAL | 3 refills | Status: AC

## 2024-05-14 MED ORDER — DOXAZOSIN MESYLATE 4 MG PO TABS: 2.0000 mg | ORAL_TABLET | Freq: Every evening | ORAL | 3 refills | Status: AC

## 2024-05-14 MED ORDER — HYDRALAZINE HCL 25 MG PO TABS: 25.0000 mg | ORAL_TABLET | Freq: Every day | ORAL | 3 refills | Status: DC

## 2024-05-14 NOTE — Patient Instructions (Signed)
 Medication Instructions:  Your physician recommends the following medication changes.  START TAKING: Hydralazine  at 4 pm  *If you need a refill on your cardiac medications before your next appointment, please call your pharmacy*  Lab Work: No labs ordered today  If you have labs (blood work) drawn today and your tests are completely normal, you will receive your results only by: MyChart Message (if you have MyChart) OR A paper copy in the mail If you have any lab test that is abnormal or we need to change your treatment, we will call you to review the results.  Testing/Procedures: No test ordered today   Follow-Up: At [facility], you and your health needs are our priority.  As part of our continuing mission to provide you with exceptional heart care, our providers are all part of one team.  This team includes your primary Cardiologist (physician) and Advanced Practice Providers or APPs (Physician Assistants and Nurse Practitioners) who all work together to provide you with the care you need, when you need it.  Your next appointment:   3 month(s)  Provider:   You may see Timothy Gollan, MD or one of the following Advanced Practice Providers on your designated Care Team:   Lonni Meager, NP Lesley Maffucci, PA-C Bernardino Bring, PA-C Cadence [place], PA-C Tylene Lunch, NP Barnie Hila, NP    We recommend signing up for the patient portal called MyChart.  Sign up information is provided on this After Visit Summary.  MyChart is used to connect with patients for Virtual Visits (Telemedicine).  Patients are able to view lab/test results, encounter notes, upcoming appointments, etc.  Non-urgent messages can be sent to your provider as well.   To learn more about what you can do with MyChart, go to [email or web address].

## 2024-05-14 NOTE — Progress Notes (Signed)
 Cardiology Office Note   Date:  05/14/2024  ID:  Austin Goodwin, DOB 01-11-1951, MRN [id number] PCP: [name], MD  [facility] Providers Cardiologist:  Evalene Lunger, MD Cardiology APP:  [name], NP     History of Present Illness Austin Goodwin is a 73 y.o. male with a past medical history of end-stage renal disease on hemodialysis, hyperlipidemia, peripheral arterial disease, carotid stenosis (occluded on the left and present in 2011), primary hypertension, type 2 diabetes, former smoker (quit 2011), syncopal episodes, is here today for follow-up.   In 2011 he reports he was acutely disoriented, dizzy, was evaluated by [facility].  He was noted to have carotid stenosis.  100% stenosis on the left.  At that time he had quit smoking.  Carotid ultrasound completed in May 2023 revealed right ICA with consistent with 1-39% stenosis, in the left showed total occlusion.  He had an episode of syncope that was noted in November 15, 2023.  Standing talking with his wife leaning on his car and he went down.  Stated that he had pulled his self back up followed by recurrent episode of syncope that was witnessed.  He was evaluated in the emergency department at [facility] and was noted to have potassium of 2.6.  Electrolytes were repleted.  Echocardiogram completed which was essentially normal before leaving AMA.  It was felt the syncope was secondary to electrolyte abnormality and drug interactions.  It was recommended that he stop metolazone and Flomax  at discharge given he was also on Cardura .  Zio patch was discussed but not ordered.  He was continued on torsemide 20 mg daily with metolazone and Flomax  remaining on hold.  He previously been seen in clinic in April 2025 by Dr. Gollan.  He was referred by his primary care provider to discuss syncopal spell that occurred bilaterally.  Denied any further symptoms of syncope after the episode feeling is feeling much better with no recurrence open  continue to remain active monitoring.  He has not been taking Bystolic secondary to nonsignificant ankle swelling of hypertension.  EKG revealed sinus rhythm with rate of 75 with first gravy block with no significant change from prior study.  Repeat carotid ultrasound was ordered.   He was last seen in clinic 03/20/2024 accompanied by his wife.  Been having issues with elevated blood pressure and drops in his blood pressure record of blood pressure logs.  Updated to the media tab in his chart.  He had previously a syncopal episode and thought was related to drops in his blood pressure.  Prior history of syncope which was thought to be related to electrolyte derangement.  He stated previously been restarted on metolazone by his nephrologist due to fluid overload.  Denies any recurrent swelling other than 1 foot but denied any abdominal bloating.  He was noted to have severe hypokalemia potassium was repeated and 3.9.  He had upcoming labs with nephrology.  Doxazosin  was decreased from 4 mg to 2 mg he was to take his hydralazine  mid afternoon to help offset some of his orthostatic symptoms.  He returns to clinic today accompanied by his wife.  He continues to have ups and downs in his blood pressures but has not had any recurrent syncopal episodes.  Continues to have occasional dizziness.  Denies any chest pain, shortness of breath or fluid overload symptoms.  States that he has been compliant with his current medication regimen.  His wife is diligent in ensuring that he has medication  in his daily regimen as prescribed.  He denies any recent hospitalizations or visits to the emergency department  ROS: 10 point review of system has been reviewed and considered negative except what has been listed in the HPI  Studies Reviewed EKG Interpretation Date/Time:  Monday May 14 2024 10:20:35 EDT Ventricular Rate:  65 PR Interval:  200 QRS Duration:  102 QT Interval:  410 QTC Calculation: 426 R  Axis:   6  Text Interpretation: Sinus rhythm with Premature atrial complexes When compared with ECG of 21-Nov-2023 14:27, Confirmed by [name] ([id number]) on 05/14/2024 12:20:02 PM    Carotid Duplex 02/20/2024 IMPRESSION: 1. Right carotid system: Mild atherosclerotic changes in the right internal carotid artery with estimated stenosis measuring less than 50%. 2. Left carotid system: Chronic occlusion of the left ICA.   Event Monitor (Zio) 12/20/2023 Normal sinus rhythm Patient had a min HR of 53 bpm, max HR of 132 bpm, and avg HR of 78 bpm.    Isolated SVEs were rare (<1.0%), and no SVE Couplets or SVE Triplets were present.    Isolated VEs were rare (<1.0%), and no VE Couplets or VE Triplets were present.    No patient triggered events recorded Risk Assessment/Calculations     Physical Exam VS:  BP (!) 140/70   Pulse 65   Ht 5' 11 (1.803 m)   Wt 210 lb 6.4 oz (95.4 kg)   SpO2 94%   BMI 29.34 kg/m        Wt Readings from Last 3 Encounters:  05/14/24 210 lb 6.4 oz (95.4 kg)  03/20/24 206 lb 9.6 oz (93.7 kg)  01/23/24 207 lb 12.8 oz (94.3 kg)    GEN: Well nourished, well developed in no acute distress NECK: No JVD; No carotid bruits CARDIAC: RRR, no murmurs, rubs, gallops RESPIRATORY:  Clear to auscultation without rales, wheezing or rhonchi  ABDOMEN: Soft, non-tender, non-distended EXTREMITIES:  No edema; No deformity   ASSESSMENT AND PLAN Primary hypertension with orthostatic hypotension with a blood pressure today of 140/70.  Blood pressure level from home reveals blood pressures that have been ranging from 114-170's.  Blood pressure has been well-controlled with a.m. dosing of medications but continues to suffer from some orthostasis early afternoon.  She has been continued on amlodipine  10 mg in the morning and torsemide 40 mg and metolazone 2.5 mg weekly, 2 mg of Cardura  nightly and 25 mg of hydralazine  at 4 PM.  He has been encouraged to monitor his pressures 1 to  2 hours postmedication administration.  Blood pressures have been better controlled with this medication regimen changing afternoon hydralazine  from 12-4 show limited to 170 blood pressures that he has in the early evening hours.  Recurrent syncope with last episode several months back with occasional lightheadedness and dizziness.  He has been without reoccurrence.  Was evaluated at [facility] and was noted to have severe hypokalemia.  Potassium was corrected.  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  Metolazone 2.5 has been continued on weekly per nephrology.  He has continued on sodium bicarb 650 mg 1 tablet twice daily encouraged to take it as prescribed nephrology and maintain all follow-up appointments with nephrology.  Peripheral arterial disease with 100% carotid stenosis on the left less than 0% on the right with most recent carotid duplex.  He is continued on aspirin  81 mg daily and rosuvastatin  10 mg daily.  Recommend yearly carotid duplex visit.  Type 2 diabetes last  hemoglobin A1c 9.7.  Continued on glipizide .  Ongoing management per PCP.       Dispo: Patient to return to clinic to see MD/APP in 3 months or sooner if needed for further evaluation.  Signed, Lux Skilton, NP

## 2024-05-21 DIAGNOSIS — Z1331 Encounter for screening for depression: Secondary | ICD-10-CM | POA: Diagnosis not present

## 2024-05-21 DIAGNOSIS — Z0000 Encounter for general adult medical examination without abnormal findings: Secondary | ICD-10-CM | POA: Diagnosis not present

## 2024-05-21 DIAGNOSIS — Z1339 Encounter for screening examination for other mental health and behavioral disorders: Secondary | ICD-10-CM | POA: Diagnosis not present

## 2024-05-21 DIAGNOSIS — I120 Hypertensive chronic kidney disease with stage 5 chronic kidney disease or end stage renal disease: Secondary | ICD-10-CM | POA: Diagnosis not present

## 2024-05-21 DIAGNOSIS — E1151 Type 2 diabetes mellitus with diabetic peripheral angiopathy without gangrene: Secondary | ICD-10-CM | POA: Diagnosis not present

## 2024-05-21 DIAGNOSIS — E785 Hyperlipidemia, unspecified: Secondary | ICD-10-CM | POA: Diagnosis not present

## 2024-05-29 DIAGNOSIS — I10 Essential (primary) hypertension: Secondary | ICD-10-CM | POA: Diagnosis not present

## 2024-06-02 DIAGNOSIS — I509 Heart failure, unspecified: Secondary | ICD-10-CM | POA: Diagnosis not present

## 2024-06-02 DIAGNOSIS — Z87891 Personal history of nicotine dependence: Secondary | ICD-10-CM | POA: Diagnosis not present

## 2024-06-02 DIAGNOSIS — E663 Overweight: Secondary | ICD-10-CM | POA: Diagnosis not present

## 2024-06-28 DIAGNOSIS — I10 Essential (primary) hypertension: Secondary | ICD-10-CM | POA: Diagnosis not present

## 2024-07-10 DIAGNOSIS — M170 Bilateral primary osteoarthritis of knee: Secondary | ICD-10-CM | POA: Diagnosis not present

## 2024-07-25 ENCOUNTER — Other Ambulatory Visit (INDEPENDENT_AMBULATORY_CARE_PROVIDER_SITE_OTHER): Payer: Self-pay | Admitting: Nurse Practitioner

## 2024-07-25 DIAGNOSIS — N186 End stage renal disease: Secondary | ICD-10-CM

## 2024-07-30 ENCOUNTER — Inpatient Hospital Stay: Attending: Endocrinology

## 2024-07-30 ENCOUNTER — Other Ambulatory Visit

## 2024-08-02 ENCOUNTER — Encounter (INDEPENDENT_AMBULATORY_CARE_PROVIDER_SITE_OTHER)

## 2024-08-02 ENCOUNTER — Encounter (INDEPENDENT_AMBULATORY_CARE_PROVIDER_SITE_OTHER): Admitting: Vascular Surgery

## 2024-08-06 ENCOUNTER — Ambulatory Visit: Attending: Cardiology | Admitting: Cardiology

## 2024-08-06 ENCOUNTER — Ambulatory Visit

## 2024-08-06 ENCOUNTER — Ambulatory Visit
Admission: RE | Admit: 2024-08-06 | Discharge: 2024-08-06 | Disposition: A | Discharge status: Home / Self Care | Source: Ambulatory Visit | Attending: Cardiology | Admitting: Cardiology

## 2024-08-06 ENCOUNTER — Encounter: Payer: Self-pay | Admitting: Cardiology

## 2024-08-06 ENCOUNTER — Ambulatory Visit: Admitting: Radiation Oncology

## 2024-08-06 ENCOUNTER — Ambulatory Visit
Admission: RE | Admit: 2024-08-06 | Discharge: 2024-08-06 | Disposition: A | Source: Ambulatory Visit | Attending: Cardiology | Admitting: Cardiology

## 2024-08-06 VITALS — BP 148/60 | HR 76 | Ht 72.0 in | Wt 210.2 lb

## 2024-08-06 DIAGNOSIS — N184 Chronic kidney disease, stage 4 (severe): Secondary | ICD-10-CM | POA: Diagnosis not present

## 2024-08-06 DIAGNOSIS — E118 Type 2 diabetes mellitus with unspecified complications: Secondary | ICD-10-CM

## 2024-08-06 DIAGNOSIS — Z87898 Personal history of other specified conditions: Secondary | ICD-10-CM | POA: Diagnosis not present

## 2024-08-06 DIAGNOSIS — E1122 Type 2 diabetes mellitus with diabetic chronic kidney disease: Secondary | ICD-10-CM | POA: Diagnosis not present

## 2024-08-06 DIAGNOSIS — R051 Acute cough: Secondary | ICD-10-CM

## 2024-08-06 DIAGNOSIS — R0609 Other forms of dyspnea: Secondary | ICD-10-CM | POA: Diagnosis not present

## 2024-08-06 DIAGNOSIS — I6523 Occlusion and stenosis of bilateral carotid arteries: Secondary | ICD-10-CM | POA: Diagnosis not present

## 2024-08-06 DIAGNOSIS — I10 Essential (primary) hypertension: Secondary | ICD-10-CM

## 2024-08-06 DIAGNOSIS — I739 Peripheral vascular disease, unspecified: Secondary | ICD-10-CM

## 2024-08-06 LAB — ECHOCARDIOGRAM COMPLETE
AR max vel: 3.01 cm2
AV Area VTI: 3.35 cm2
AV Area mean vel: 3.05 cm2
AV Mean grad: 4 mmHg
AV Peak grad: 7.7 mmHg
Ao pk vel: 1.39 m/s
Area-P 1/2: 2.85 cm2
Height: 72 in
S' Lateral: 3.81 cm
Weight: 3363.2 [oz_av]

## 2024-08-06 MED ORDER — ALBUTEROL SULFATE HFA 108 (90 BASE) MCG/ACT IN AERS: 2.0000 | INHALATION_SPRAY | RESPIRATORY_TRACT | 2 refills | Status: AC | PRN

## 2024-08-06 MED ORDER — HYDRALAZINE HCL 25 MG PO TABS: 25.0000 mg | ORAL_TABLET | Freq: Two times a day (BID) | ORAL | 3 refills | Status: AC

## 2024-08-06 NOTE — Progress Notes (Signed)
 " Cardiology Office Note   Date:  08/06/2024  ID:  NATHIN SARAN, DOB 1950-09-01, MRN [id number] PCP: [name], MD  [facility] Providers Cardiologist:  Evalene Lunger, MD Cardiology APP:  [name], NP     History of Present Illness Austin Goodwin is a 74 y.o. male with a past medical history of end-stage renal disease on hemodialysis, hyperlipidemia, peripheral arterial disease, carotid stenosis (occluded on the left in 2011), primary pretension, type 2 diabetes, former smoker (quit 2011), history of syncope, seen today for follow-up.   In 2011 he reports he was acutely disoriented, dizzy, was evaluated by [facility].  He was noted to have carotid stenosis.  100% stenosis on the left.  At that time he had quit smoking.  Carotid ultrasound completed in May 2023 revealed right ICA with consistent with 1-39% stenosis, in the left showed total occlusion.  He had an episode of syncope that was noted in November 15, 2023.  Standing talking with his wife leaning on his car and he went down.  Stated that he had pulled his self back up followed by recurrent episode of syncope that was witnessed.  He was evaluated in the emergency department at [facility] and was noted to have potassium of 2.6.  Electrolytes were repleted.  Echocardiogram completed which was essentially normal before leaving AMA.  It was felt the syncope was secondary to electrolyte abnormality and drug interactions.  It was recommended that he stop metolazone and Flomax  at discharge given he was also on Cardura .  Zio patch was discussed but not ordered.  He was continued on torsemide 20 mg daily with metolazone and Flomax  remaining on hold.  He previously been seen in clinic in April 2025 by Dr. Gollan.  He was referred by his primary care provider to discuss syncopal spell that occurred bilaterally.  Denied any further symptoms of syncope after the episode feeling is feeling much better with no recurrence open continue to remain  active monitoring.  He has not been taking Bystolic secondary to nonsignificant ankle swelling of hypertension.  EKG revealed sinus rhythm with rate of 75 with first gravy block with no significant change from prior study.  Repeat carotid ultrasound was ordered.  He was last seen in clinic 05/14/2024 accompanied by his wife.  Continued emphasis notes that his blood pressure but has not had any recurrent syncopal episodes.  Continues to have occasional dizziness but denied any other associated symptoms.  Continue to adjust medications to tolerate for improvements in blood pressure.   He returns to clinic today accompanied by his wife.  He continues to have extreme swings in his blood pressure and comes in with headaches.  Stated that they talked to the pharmacist pharmacist on the swings in his blood pressure had been dangerous.  And that they were advised to sit and retake the blood pressure about after 5 minutes.  He returns today without any recurrent syncopal episodes continues to have occasional dizziness but denies any chest pain.  Does have a congested cough and dyspnea.  States that they have upcoming appointment with vascular for potential AV fistula placement due to worsening kidney function.  He continues to follow with nephrology but recently had to cancel his follow-up appointment due to his wife having cataract surgery.  They are concerned today about his congested cough that has been going on for approximately a month.  With concerns of potential pneumonia.  He continues to make urine and states that he makes good  urine with his current fluid medications.  Denies any swelling or weight gain.  He also denies any hospitalizations or visits to the emergency department.  ROS: 10 point review of systems has been reviewed and considered negative except was listed in the HPI  Studies Reviewed EKG Interpretation Date/Time:  Monday August 06 2024 10:31:11 EST Ventricular Rate:  76 PR Interval:     QRS Duration:  110 QT Interval:  396 QTC Calculation: 445 R Axis:   3  Text Interpretation: Sinus rhythm Premature atrial complexes Left ventricular hypertrophy When compared with ECG of 14-May-2024 10:20, Confirmed by [name] ([id number]) on 08/06/2024 10:36:15 AM    Carotid Duplex 02/20/2024 IMPRESSION: 1. Right carotid system: Mild atherosclerotic changes in the right internal carotid artery with estimated stenosis measuring less than 50%. 2. Left carotid system: Chronic occlusion of the left ICA.   Event Monitor (Zio) 12/20/2023 Normal sinus rhythm Patient had a min HR of 53 bpm, max HR of 132 bpm, and avg HR of 78 bpm.    Isolated SVEs were rare (<1.0%), and no SVE Couplets or SVE Triplets were present.    Isolated VEs were rare (<1.0%), and no VE Couplets or VE Triplets were present.    No patient triggered events recorded  Risk Assessment/Calculations     Physical Exam VS:  BP (!) 148/60 (BP Location: Left Arm, Patient Position: Sitting, Cuff Size: Normal)   Pulse 76   Ht 6' (1.829 m)   Wt 210 lb 3.2 oz (95.3 kg)   SpO2 94%   BMI 28.51 kg/m        Wt Readings from Last 3 Encounters:  08/06/24 210 lb 3.2 oz (95.3 kg)  05/14/24 210 lb 6.4 oz (95.4 kg)  03/20/24 206 lb 9.6 oz (93.7 kg)    GEN: Well nourished, well developed in no acute distress NECK: No JVD; No carotid bruits CARDIAC: RRR, no murmurs, rubs, gallops RESPIRATORY:  Clear to auscultation without rales, wheezing or rhonchi  ABDOMEN: Soft, non-tender, non-distended EXTREMITIES:  No edema; No deformity   ASSESSMENT AND PLAN Primary hypertension with orthostatic hypotension with a blood pressure today 148/60.  Blood pressure log from home reveals blood pressures in the 120s to 170s.  Blood pressure is typically higher in the evenings.  He has continued on amlodipine  10 mg in the morning, torsemide 40 mg daily, metolazone 2.5 mg weekly, 2 mg of doxazosin  nightly and hydralazine  being increased to 25 mg  twice daily once at midmorning and once that evening snack.  He has been encouraged to continue to monitor his pressure 1 to 2 hours postmedication administration not 5 minutes and not before.  EKG today reveals sinus rhythm with PACs with LVH with rate of 76 with no acute ischemic changes noted.  Dyspnea on exertion where he has previously scheduled echocardiogram.  He states prior to this episode of cough and chest cold his dyspnea was unchanged.  History of syncope without any reoccurrence.  He continues to have occasional lightheadedness and dizziness.  He was evaluated at [facility] with his last episode and had severe hypokalemia.  Potassium was corrected and is maintained stable with follow-up labs.  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  He has upcoming appointment with vascular for potential AV fistula.  He is continued on metolazone, torsemide, and sodium bicarb.  Ongoing management per nephrology.  Peripheral arterial disease with 100% carotid stenosis on the left and less than 50% on the  right.  He is continued on aspirin  81 mg daily rosuvastatin  10 mg daily.  Recommend yearly carotid duplexes.  Type 2 diabetes with previously poorly controlled.  He is continued on glipizide  5 mg daily last hemoglobin A1c 8.3 which improved from previous 9.7.  Ongoing management per PCP.  Congested cough where he has been sent for a chest x-ray.  He has been encouraged to try Mucinex without the D but he has been reminded he will have to maintain adequate hydration.  His albuterol  inhaler has been refilled.  If chest x-ray comes back concerning for pneumonia he will require antibiotic therapy for CAP.        Dispo: Patient to return to clinic to see MD/APP in 6 weeks or sooner if needed for further evaluation  Signed, Vang Kraeger, NP   "

## 2024-08-06 NOTE — Patient Instructions (Addendum)
 Medication Instructions:  Your physician recommends the following medication changes.  INCREASE: Hydrazaline to 25 mg  2 times daily   *If you need a refill on your cardiac medications before your next appointment, please call your pharmacy*  Lab Work: No labs ordered today  If you have labs (blood work) drawn today and your tests are completely normal, you will receive your results only by: MyChart Message (if you have MyChart) OR A paper copy in the mail If you have any lab test that is abnormal or we need to change your treatment, we will call you to review the results.  Testing/Procedures: Your provider has ordered a chest X-Ray for you. You can have this done at the [facility] medical mall. You do not need an appointment. Please go to the entrance of the Medical Mall and check in at the front desk.    Follow-Up: At [facility], you and your health needs are our priority.  As part of our continuing mission to provide you with exceptional heart care, our providers are all part of one team.  This team includes your primary Cardiologist (physician) and Advanced Practice Providers or APPs (Physician Assistants and Nurse Practitioners) who all work together to provide you with the care you need, when you need it.  Your next appointment:   6 week(s)  Provider:   You may see Timothy Gollan, MD or one of the following Advanced Practice Providers on your designated Care Team:   Tylene Lunch, NP  We recommend signing up for the patient portal called MyChart.  Sign up information is provided on this After Visit Summary.  MyChart is used to connect with patients for Virtual Visits (Telemedicine).  Patients are able to view lab/test results, encounter notes, upcoming appointments, etc.  Non-urgent messages can be sent to your provider as well.   To learn more about what you can do with MyChart, go to [email or web address].

## 2024-08-07 ENCOUNTER — Ambulatory Visit: Payer: Self-pay | Admitting: Cardiology

## 2024-08-07 MED ORDER — AZITHROMYCIN 250 MG PO TABS: ORAL_TABLET | ORAL | 0 refills | Status: AC

## 2024-08-07 NOTE — Progress Notes (Signed)
 No acute changes noted on chest x-ray.  No fluid or concerns for pneumonia.

## 2024-08-07 NOTE — Addendum Note (Signed)
 Addended by: MAUDINE SLOUGH D on: 08/07/2024 09:09 AM   Modules accepted: Orders

## 2024-08-09 ENCOUNTER — Telehealth (INDEPENDENT_AMBULATORY_CARE_PROVIDER_SITE_OTHER): Payer: Self-pay

## 2024-08-09 NOTE — Telephone Encounter (Signed)
 Received message on triage line from wife stating that she had some concerns with his upcoming appointment and surgery soon to follow that appointment. Per chart appointment notes pt is to be seen here in office for US  and a NP consult following on 08/10/24. He is then to have a cyst removal at [facility] on the 28th call to pt left a detailed voicemail with the above information and our contact if she has further questions.

## 2024-08-10 ENCOUNTER — Encounter (INDEPENDENT_AMBULATORY_CARE_PROVIDER_SITE_OTHER): Payer: Self-pay | Admitting: Nurse Practitioner

## 2024-08-10 ENCOUNTER — Other Ambulatory Visit (INDEPENDENT_AMBULATORY_CARE_PROVIDER_SITE_OTHER)

## 2024-08-10 ENCOUNTER — Ambulatory Visit (INDEPENDENT_AMBULATORY_CARE_PROVIDER_SITE_OTHER): Admitting: Nurse Practitioner

## 2024-08-10 VITALS — BP 149/78 | HR 85 | Resp 18 | Ht 71.0 in | Wt 213.0 lb

## 2024-08-10 DIAGNOSIS — E1122 Type 2 diabetes mellitus with diabetic chronic kidney disease: Secondary | ICD-10-CM

## 2024-08-10 DIAGNOSIS — N186 End stage renal disease: Secondary | ICD-10-CM

## 2024-08-10 DIAGNOSIS — I10 Essential (primary) hypertension: Secondary | ICD-10-CM

## 2024-08-18 ENCOUNTER — Encounter (INDEPENDENT_AMBULATORY_CARE_PROVIDER_SITE_OTHER): Payer: Self-pay | Admitting: Nurse Practitioner

## 2024-08-18 NOTE — Progress Notes (Signed)
 "  Subjective:    Patient ID: Austin Goodwin, male    DOB: 03-05-1951, 74 y.o.   MRN: [id number] Chief Complaint  Patient presents with   New Patient (Initial Visit)    Ref Austin Goodwin fistula placement    HPI  Discussed the use of AI scribe software for clinical note transcription with the patient, who gave verbal consent to proceed.  History of Present Illness Austin Goodwin is a 74 year old male with end-stage renal disease who presents for vascular access evaluation in anticipation of possible hemodialysis initiation.  He is being evaluated at the request of his nephrologist due to concern for impending need for dialysis. He is not currently on dialysis but has previously undergone hemodialysis with temporary catheters placed in the neck and chest. He is familiar with the dialysis process and vascular access procedures. He denies current swelling. He reports shortness of breath today, which he attributes to a recent episode of pneumonia rather than volume overload.  He is ambidextrous but has significant right hand impairment, including prior laceration, fracture, persistent numbness, and decreased dexterity. He uses both hands for daily activities but describes the right hand as weaker and less functional. 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  He expresses concern about the timing of access placement, preferring to avoid premature placement that could result in graft thrombosis if not used promptly, but also wishes to avoid urgent catheter placement if dialysis becomes necessary. He is hesitant about further interventions due to prior complications, including a decline in kidney function following prostate cancer radiation therapy and a recent cataract surgery complicated by poor visual  outcome.    Results     Review of Systems  Neurological:  Positive for weakness (R arm).  All other systems reviewed and are negative.      Objective:   Physical Exam Vitals reviewed.  HENT:     Head: Normocephalic.  Cardiovascular:     Rate and Rhythm: Normal rate.     Pulses:          Carotid pulses are 2+ on the right side and 2+ on the left side. Pulmonary:     Effort: Pulmonary effort is normal.  Skin:    General: Skin is warm and dry.  Neurological:     Mental Status: He is alert and oriented to person, place, and time.  Psychiatric:        Mood and Affect: Mood normal.        Behavior: Behavior normal.        Thought Content: Thought content normal.        Judgment: Judgment normal.     Physical Exam EXTREMITIES: Hand warm with strong pulse.  BP (!) 149/78 (BP Location: Left Arm)   Pulse 85   Resp 18   Ht 5' 11 (1.803 m)   Wt 213 lb (96.6 kg)   BMI 29.71 kg/m   Past Medical History:  Diagnosis Date   Diabetes mellitus without complication (HCC)    Dysplastic nevus 12/23/2022   R post calf - mild   Hypertension     Social History   Socioeconomic History   Marital status: Married    Spouse name: Not on file   Number of children: Not on file   Years of education: Not on file  Highest education level: Not on file  Occupational History   Not on file  Tobacco Use   Smoking status: Former    Current packs/day: 0.00    Types: Cigarettes    Quit date: 07/27/1999    Years since quitting: 25.0   Smokeless tobacco: Never  Vaping Use   Vaping status: Never Used  Substance and Sexual Activity   Alcohol  use: Yes    Alcohol /week: 1.0 standard drink of alcohol     Types: 1 Cans of beer per week    Comment: occasionally, Last drink 1 beer 11/15/19   Drug use: No   Sexual activity: Yes    Birth control/protection: None  Other Topics Concern   Not on file  Social History Narrative   Not on file   Social Drivers of Health   Tobacco Use:  Medium Risk (08/18/2024)   Patient History    Smoking Tobacco Use: Former    Smokeless Tobacco Use: Never    Passive Exposure: Not on Actuary Strain: Not on file  Food Insecurity: Not on file  Transportation Needs: Not on file  Physical Activity: Not on file  Stress: Not on file  Social Connections: Unknown (12/07/2021)   Received from [facility]   Social Network    Social Network: Not on file  Intimate Partner Violence: Unknown (10/29/2021)   Received from [facility]   HITS    Physically Hurt: Not on file    Insult or Talk Down To: Not on file    Threaten Physical Harm: Not on file    Scream or Curse: Not on file  Depression (PHQ2-9): Not on file  Alcohol  Screen: Not on file  Housing: Not on file  Utilities: Not on file  Health Literacy: Not on file    Past Surgical History:  Procedure Laterality Date   CATARACT EXTRACTION, BILATERAL     DIALYSIS/PERMA CATHETER INSERTION N/A 08/10/2019   Procedure: DIALYSIS/PERMA CATHETER INSERTION;  Surgeon: Marea Selinda RAMAN, MD;  Location: [facility] INVASIVE [facility];  Service: Cardiovascular;  Laterality: N/A;   DIALYSIS/PERMA CATHETER REMOVAL N/A 09/24/2019   Procedure: DIALYSIS/PERMA CATHETER REMOVAL;  Surgeon: Marea Selinda RAMAN, MD;  Location: [facility] INV[facility];  Service: Cardiovascular;  Laterality: N/A;    Family History  Problem Relation Age of Onset   Mitral valve prolapse Mother    Heart disease Father    Angina Father 30   Angina Maternal Grandmother    Cancer Paternal Grandmother     Allergies[1]     Latest Ref Rng & Units 08/25/2023    1:47 PM 08/11/2023    2:05 PM 11/21/2019    5:23 AM  CBC  WBC 4.0 - 10.5 K/uL 8.5  5.7  6.7   Hemoglobin 13.0 - 17.0 g/dL 88.0  88.3  9.4   Hematocrit 39.0 - 52.0 % 32.6  32.6  28.0   Platelets 150 - 400 K/uL 142  146  105       CMP     Component Value Date/Time   NA 139 11/21/2023 1455   K 3.4 (L) 11/21/2023 1455   CL 96 11/21/2023 1455   CO2 19 (L) 11/21/2023 1455    GLUCOSE 321 (H) 11/21/2023 1455   GLUCOSE 115 (H) 11/21/2019 0523   BUN 68 (H) 11/21/2023 1455   CREATININE 4.13 (H) 11/21/2023 1455   CREATININE 7.85 (HH) 07/30/2019 1120   CALCIUM  9.6 11/21/2023 1455   PROT 7.6 07/30/2019 1120   ALBUMIN 2.5 (L) 11/21/2019  0523   AST 9 (L) 07/30/2019 1120   ALT 19 07/30/2019 1120   ALKPHOS 85 07/30/2019 1120   BILITOT 0.9 07/30/2019 1120   EGFR 14 (L) 11/21/2023 1455   GFRNONAA 12 (L) 11/21/2019 0523   GFRNONAA 6 (L) 07/30/2019 1120     No results found.     Assessment & Plan:   1. ESRD (end stage renal disease) (HCC) (Primary) Vascular access planning for hemodialysis He requires vascular access for anticipated hemodialysis due to end-stage renal disease. Small-caliber veins preclude arteriovenous fistula; prosthetic graft placement recommended. Right arm preferred to preserve left hand function. Discussed risks including infection, thrombosis, bleeding, trauma, and steal syndrome. Timing coordinated with nephrology to balance risks and ensure timely dialysis initiation. - Communicated with nephrology (Dr. Dennise) regarding vascular access evaluation and timing of placement. - Recommended right arm for graft placement to preserve left hand function.  Recommend right brachial axillary AV graft - Discussed risks, benefits, and care of graft with him and family, including infection, thrombosis, bleeding, and trauma risk due to occupation. - Advised avoidance of blood pressure cuffs, intravenous lines, and phlebotomy in the arm with the access once placed. - Instructed him to contact clinic when nephrology determines dialysis is imminent to schedule access placement. - Provided anticipatory guidance on post-operative wound care and activity restrictions until healed. - No follow-up scheduled; instructed to call as needed per nephrology recommendations.  2. Essential hypertension Continue antihypertensive medications as already ordered, these  medications have been reviewed and there are no changes at this time.  3. Type 2 diabetes mellitus with chronic kidney disease, without long-term current use of insulin , unspecified CKD stage (HCC) Continue hypoglycemic medications as already ordered, these medications have been reviewed and there are no changes at this time.  Hgb A1C to be monitored as already arranged by primary service   Assessment and Plan Assessment & Plan      Medications Ordered Prior to Encounter[2]  There are no Patient Instructions on file for this visit. No follow-ups on file.   Rayona Sardinha E Maury Groninger, NP      [1]  Allergies Allergen Reactions   Levofloxacin  Hives, Other (See Comments) and Rash    Brain fog unable to think  levofloxacin   [2]  Current Outpatient Medications on File Prior to Visit  Medication Sig Dispense Refill   albuterol  (VENTOLIN  HFA) 108 (90 Base) MCG/ACT inhaler Inhale 2 puffs into the lungs every 4 (four) hours as needed for wheezing or shortness of breath (cough). 8 g 2   allopurinol (ZYLOPRIM) 100 MG tablet Take 100 mg by mouth daily.     amLODipine  (NORVASC ) 10 MG tablet Take 1 tablet (10 mg total) by mouth daily after breakfast. 90 tablet 3   aspirin  81 MG chewable tablet Chew 81 mg by mouth at bedtime.     azithromycin  (ZITHROMAX ) 250 MG tablet TAKE 1 TABLET TWO TIMES ON DAY 1, THEN 1 TABLET DAILY TIMES 4 DAYS 6 each 0   cyanocobalamin  1000 MCG tablet Take 1,000 mcg by mouth daily.     doxazosin  (CARDURA ) 4 MG tablet Take 0.5 tablets (2 mg total) by mouth at bedtime. 15 tablet 3   glipiZIDE  (GLUCOTROL  XL) 5 MG 24 hr tablet Take 5 mg by mouth 2 (two) times daily.     hydrALAZINE  (APRESOLINE ) 25 MG tablet Take 1 tablet (25 mg total) by mouth in the morning and at bedtime. 180 tablet 3   Multiple Vitamin (MULTIVITAMIN) tablet Take 1 tablet by mouth  daily.     omeprazole (PRILOSEC) 20 MG capsule Take 1 capsule by mouth daily.     potassium chloride  SA (KLOR-CON  M) 20 MEQ  tablet Take 40 mEq by mouth daily. X 5 days     rosuvastatin  (CRESTOR ) 10 MG tablet TAKE ONE TABLET BY MOUTH TWICE A WEEK (ON Wednesday AND Sunday) FOR CHOLESTEROL 90 tablet 3   sodium bicarbonate  650 MG tablet Take 1,300 mg by mouth 2 (two) times daily.     torsemide (DEMADEX) 20 MG tablet Take 20 mg by mouth in the morning. (Patient taking differently: Take 40 mg by mouth in the morning.)     traMADol  (ULTRAM ) 50 MG tablet Take 50 mg by mouth as needed for pain.     UNABLE TO FIND Take 1 tablet by mouth at bedtime. Cranberry berry and vitamin c     zolpidem (AMBIEN) 5 MG tablet Take 5 mg by mouth as needed.     metolazone (ZAROXOLYN) 2.5 MG tablet Take 2.5 mg by mouth once a week. (Patient not taking: Reported on 08/10/2024)     No current facility-administered medications on file prior to visit.   "

## 2024-08-20 ENCOUNTER — Ambulatory Visit: Admitting: Cardiovascular Disease

## 2024-08-21 ENCOUNTER — Telehealth: Payer: Self-pay | Admitting: Radiation Oncology

## 2024-08-21 NOTE — Telephone Encounter (Signed)
 Pt spouse called to r/s appt - sent message to [facility] team and forwarded call - LH

## 2024-08-22 ENCOUNTER — Encounter: Admitting: Dermatology

## 2024-08-22 ENCOUNTER — Ambulatory Visit: Admitting: Radiation Oncology

## 2024-09-18 ENCOUNTER — Ambulatory Visit: Admitting: Cardiology

## 2024-09-19 ENCOUNTER — Inpatient Hospital Stay

## 2024-09-26 ENCOUNTER — Ambulatory Visit: Admitting: Radiation Oncology
